# Patient Record
Sex: Female | Born: 1945 | Race: White | Hispanic: No | Smoking: Former smoker
Health system: Southern US, Community
[De-identification: ages and names within clinical notes are randomized; demographics above are authoritative.]

## PROBLEM LIST (undated history)

## (undated) DIAGNOSIS — G4700 Insomnia, unspecified: Secondary | ICD-10-CM

## (undated) DIAGNOSIS — I10 Essential (primary) hypertension: Secondary | ICD-10-CM

## (undated) DIAGNOSIS — R519 Headache, unspecified: Secondary | ICD-10-CM

## (undated) DIAGNOSIS — E785 Hyperlipidemia, unspecified: Secondary | ICD-10-CM

## (undated) DIAGNOSIS — Z8619 Personal history of other infectious and parasitic diseases: Secondary | ICD-10-CM

## (undated) DIAGNOSIS — M6250 Muscle wasting and atrophy, not elsewhere classified, unspecified site: Secondary | ICD-10-CM

## (undated) DIAGNOSIS — D519 Vitamin B12 deficiency anemia, unspecified: Secondary | ICD-10-CM

## (undated) DIAGNOSIS — F4321 Adjustment disorder with depressed mood: Secondary | ICD-10-CM

## (undated) DIAGNOSIS — R41841 Cognitive communication deficit: Secondary | ICD-10-CM

## (undated) DIAGNOSIS — J302 Other seasonal allergic rhinitis: Secondary | ICD-10-CM

## (undated) DIAGNOSIS — M810 Age-related osteoporosis without current pathological fracture: Secondary | ICD-10-CM

## (undated) DIAGNOSIS — J449 Chronic obstructive pulmonary disease, unspecified: Secondary | ICD-10-CM

## (undated) DIAGNOSIS — J45909 Unspecified asthma, uncomplicated: Secondary | ICD-10-CM

## (undated) DIAGNOSIS — R51 Headache: Secondary | ICD-10-CM

## (undated) HISTORY — DX: Headache: R51

## (undated) HISTORY — DX: Adjustment disorder with depressed mood: F43.21

## (undated) HISTORY — DX: Insomnia, unspecified: G47.00

## (undated) HISTORY — DX: Vitamin B12 deficiency anemia, unspecified: D51.9

## (undated) HISTORY — DX: Essential (primary) hypertension: I10

## (undated) HISTORY — DX: Hyperlipidemia, unspecified: E78.5

## (undated) HISTORY — DX: Personal history of other infectious and parasitic diseases: Z86.19

## (undated) HISTORY — DX: Other seasonal allergic rhinitis: J30.2

## (undated) HISTORY — PX: BREAST CYST ASPIRATION: SHX578

## (undated) HISTORY — PX: NASAL SINUS SURGERY: SHX719

## (undated) HISTORY — DX: Age-related osteoporosis without current pathological fracture: M81.0

## (undated) HISTORY — PX: ABDOMINAL HYSTERECTOMY: SHX81

## (undated) HISTORY — DX: Unspecified asthma, uncomplicated: J45.909

## (undated) HISTORY — DX: Headache, unspecified: R51.9

---

## 1968-08-01 HISTORY — PX: TONSILLECTOMY: SHX5217

## 1969-08-01 HISTORY — PX: BREAST EXCISIONAL BIOPSY: SUR124

## 1992-08-01 HISTORY — PX: TOTAL ABDOMINAL HYSTERECTOMY W/ BILATERAL SALPINGOOPHORECTOMY: SHX83

## 2004-06-16 ENCOUNTER — Ambulatory Visit: Payer: Self-pay

## 2006-09-21 ENCOUNTER — Ambulatory Visit: Payer: Self-pay | Admitting: Allergy and Immunology

## 2012-08-01 DIAGNOSIS — D519 Vitamin B12 deficiency anemia, unspecified: Secondary | ICD-10-CM

## 2012-08-01 HISTORY — DX: Vitamin B12 deficiency anemia, unspecified: D51.9

## 2013-05-01 LAB — COMPREHENSIVE METABOLIC PANEL
ALT: 13 U/L (ref 7–35)
AST: 8 U/L
Albumin: 4.4
Alkaline Phosphatase: 81 U/L
CREATININE: 0.8
Total Bilirubin: 0.5 mg/dL

## 2013-05-01 LAB — LIPID PANEL: Cholesterol: 170 mg/dL (ref 0–200)

## 2013-05-01 LAB — VITAMIN D 25 HYDROXY (VIT D DEFICIENCY, FRACTURES): VIT D 25 HYDROXY: 15

## 2013-05-09 LAB — IRON: Iron: 58

## 2013-05-09 LAB — CBC
HGB: 11.3 g/dL
WBC: 6
platelet count: 235

## 2013-05-09 LAB — FOLATE: FOLATE: 9.69

## 2013-05-09 LAB — FERRITIN: Ferritin: 93

## 2013-05-09 LAB — VITAMIN B12: B-12, serum: 214

## 2013-07-01 HISTORY — PX: COLONOSCOPY: SHX174

## 2013-07-15 ENCOUNTER — Ambulatory Visit: Payer: Self-pay | Admitting: Gastroenterology

## 2013-10-22 ENCOUNTER — Ambulatory Visit (INDEPENDENT_AMBULATORY_CARE_PROVIDER_SITE_OTHER): Payer: Commercial Managed Care - HMO | Admitting: Family Medicine

## 2013-10-22 ENCOUNTER — Encounter: Payer: Self-pay | Admitting: Family Medicine

## 2013-10-22 ENCOUNTER — Telehealth: Payer: Self-pay | Admitting: Family Medicine

## 2013-10-22 VITALS — BP 128/62 | HR 74 | Temp 98.1°F | Ht 58.5 in | Wt 123.0 lb

## 2013-10-22 DIAGNOSIS — F4321 Adjustment disorder with depressed mood: Secondary | ICD-10-CM

## 2013-10-22 DIAGNOSIS — J309 Allergic rhinitis, unspecified: Secondary | ICD-10-CM

## 2013-10-22 DIAGNOSIS — E785 Hyperlipidemia, unspecified: Secondary | ICD-10-CM

## 2013-10-22 DIAGNOSIS — G4700 Insomnia, unspecified: Secondary | ICD-10-CM

## 2013-10-22 DIAGNOSIS — I10 Essential (primary) hypertension: Secondary | ICD-10-CM

## 2013-10-22 DIAGNOSIS — J302 Other seasonal allergic rhinitis: Secondary | ICD-10-CM

## 2013-10-22 MED ORDER — DULOXETINE HCL 60 MG PO CPEP: 60.0000 mg | ORAL_CAPSULE | Freq: Every day | ORAL | Status: DC

## 2013-10-22 MED ORDER — CETIRIZINE HCL 10 MG PO TABS: 10.0000 mg | ORAL_TABLET | Freq: Every day | ORAL | Status: DC

## 2013-10-22 NOTE — Assessment & Plan Note (Signed)
Discussed decreased ambien to 5mg  daily.

## 2013-10-22 NOTE — Progress Notes (Signed)
BP 128/62  Pulse 74  Temp(Src) 98.1 F (36.7 C) (Oral)  Ht 4' 10.5" (1.486 m)  Wt 123 lb (55.792 kg)  BMI 25.27 kg/m2  SpO2 96%   CC: new pt to establish  Subjective:    Patient ID: Courtney Thomas, female    DOB: Aug 22, 1945, 68 y.o.   MRN: [id number]  HPI: Courtney Thomas is a 68 y.o. female presenting on 10/22/2013 for Establish Care   Previously saw Dr. Sabra Heck at [facility].  Also sees Dr. Erlinda Hong ortho.  I am familiar with patient as I care for her husband.  Depression with adjustment to husband's dementia/neurological disease.  Compliant with cymbalta 30mg  daily for last 1 year.  Prior on zoloft 25mg  but noticed increased tears.  Finds trouble handling stressors.  May look into respite care for personal time to avoid caregiver burnout.  HTN - ongoing issue for last 5 years.  Compliant with lisinopril hctz 20/12.5mg  daily HLD - compliant with lipitor 10mg  daily. Asthma - stable on PRN dulera.  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  Preventative: Last CPE was 1 year ago. Colon[place] - 07/[place]NL ([place]) Pap smear - 2014 WNL ([facility] in [place]) DEXA - several years ago abnormal, no f/u Does not receive flu shot - egg allergy Tdap - 2013 with bad reaction  Relevant past medical, surgical, family and social history reviewed and updated as indicated.  Allergies and medications reviewed and updated. No current outpatient prescriptions on file prior to visit.   No current facility-administered medications on file prior to visit.    Review of Systems Per HPI unless specifically indicated above    Objective:    BP 128/62  Pulse 74  Temp(Src) 98.1 F (36.7 C) (Oral)  Ht 4' 10.5" (1.486 m)  Wt 123 lb (55.792 kg)  BMI 25.27 kg/m2  SpO2 96%  Physical Exam  Nursing note and vitals reviewed. Constitutional: She is oriented to person, place, and time. She appears well-developed and well-nourished. No distress.  HENT:  Head: Normocephalic and  atraumatic.  Right Ear: Hearing, tympanic membrane, external ear and ear canal normal.  Left Ear: Hearing, tympanic membrane, external ear and ear canal normal.  Nose: Nose normal.  Mouth/Throat: Uvula is midline, oropharynx is clear and moist and mucous membranes are normal. No oropharyngeal exudate, posterior oropharyngeal edema or posterior oropharyngeal erythema.  Eyes: Conjunctivae and EOM are normal. Pupils are equal, round, and reactive to light. No scleral icterus.  Neck: Normal range of motion. Neck supple. No thyromegaly present.  Cardiovascular: Normal rate, regular rhythm, normal heart sounds and intact distal pulses.   No murmur heard. Pulses:      Radial pulses are 2+ on the right side, and 2+ on the left side.  Pulmonary/Chest: Effort normal and breath sounds normal. No respiratory distress. She has no wheezes. She has no rales.  Musculoskeletal: Normal range of motion. She exhibits no edema.  Lymphadenopathy:    She has no cervical adenopathy.  Neurological: She is alert and oriented to person, place, and time.  CN grossly intact, station and gait intact  Skin: Skin is warm and dry. No rash noted.  Psychiatric: She has a normal mood and affect. Her behavior is normal. Judgment and thought content normal.   No results found for this or any previous visit.    Assessment & Plan:   Problem List Items Addressed This Visit   Adjustment disorder with depressed mood     Could do  better with stress.  Will increase cymbalta to 60mg  dialy.  Pt agrees with plan.    Hyperlipidemia     Continue lipitor 10mg  dialy.  I have requested records from prior PCP Check FLP next fasting blood work.    Relevant Medications      lisinopril-hydrochlorothiazide (PRINZIDE,ZESTORETIC) 20-12.5 MG per tablet      atorvastatin (LIPITOR) 20 MG tablet   Hypertension - Primary     Continue lisinopril hctz.  I have requested records from prior PCP.    Insomnia     Discussed decreased ambien to 5mg   daily.    Seasonal allergic rhinitis     Continue cetirizine.  Refilled today        Follow up plan: Return in about 4 months (around 02/21/2014), or as needed, for annual exam, prior fasting for blood work.

## 2013-10-22 NOTE — Assessment & Plan Note (Signed)
Continue cetirizine.  Refilled today

## 2013-10-22 NOTE — Patient Instructions (Signed)
Sign release form for records from [facility] Dr. Sabra Heck. Let's increase cymbalta to 60mg  daily and let me know how mood is doing. Return at your conveinence fasting for blood work and afterwards for physical. Good to see you today!

## 2013-10-22 NOTE — Progress Notes (Signed)
Pre visit review using our clinic review tool, if applicable. No additional management support is needed unless otherwise documented below in the visit note. 

## 2013-10-22 NOTE — Assessment & Plan Note (Signed)
Continue lisinopril hctz.  I have requested records from prior PCP.

## 2013-10-22 NOTE — Telephone Encounter (Signed)
Relevant patient education mailed to patient.  

## 2013-10-22 NOTE — Assessment & Plan Note (Signed)
Could do better with stress.  Will increase cymbalta to 60mg  dialy.  Pt agrees with plan.

## 2013-10-22 NOTE — Assessment & Plan Note (Signed)
Continue lipitor 10mg  dialy.  I have requested records from prior PCP Check FLP next fasting blood work.

## 2013-11-30 ENCOUNTER — Encounter: Payer: Self-pay | Admitting: Family Medicine

## 2013-11-30 DIAGNOSIS — E538 Deficiency of other specified B group vitamins: Secondary | ICD-10-CM | POA: Insufficient documentation

## 2013-12-02 ENCOUNTER — Encounter: Payer: Self-pay | Admitting: *Deleted

## 2013-12-07 ENCOUNTER — Emergency Department: Payer: Self-pay | Admitting: Emergency Medicine

## 2013-12-19 ENCOUNTER — Encounter: Payer: Self-pay | Admitting: Family Medicine

## 2014-02-13 ENCOUNTER — Other Ambulatory Visit: Payer: Self-pay | Admitting: Family Medicine

## 2014-02-13 DIAGNOSIS — E559 Vitamin D deficiency, unspecified: Secondary | ICD-10-CM | POA: Insufficient documentation

## 2014-02-13 DIAGNOSIS — E785 Hyperlipidemia, unspecified: Secondary | ICD-10-CM

## 2014-02-13 DIAGNOSIS — D519 Vitamin B12 deficiency anemia, unspecified: Secondary | ICD-10-CM

## 2014-02-13 DIAGNOSIS — I10 Essential (primary) hypertension: Secondary | ICD-10-CM

## 2014-02-14 ENCOUNTER — Other Ambulatory Visit (INDEPENDENT_AMBULATORY_CARE_PROVIDER_SITE_OTHER): Payer: Commercial Managed Care - HMO

## 2014-02-14 DIAGNOSIS — E559 Vitamin D deficiency, unspecified: Secondary | ICD-10-CM

## 2014-02-14 DIAGNOSIS — I10 Essential (primary) hypertension: Secondary | ICD-10-CM

## 2014-02-14 DIAGNOSIS — D519 Vitamin B12 deficiency anemia, unspecified: Secondary | ICD-10-CM

## 2014-02-14 DIAGNOSIS — D518 Other vitamin B12 deficiency anemias: Secondary | ICD-10-CM

## 2014-02-14 DIAGNOSIS — E785 Hyperlipidemia, unspecified: Secondary | ICD-10-CM

## 2014-02-14 LAB — LIPID PANEL
CHOLESTEROL: 151 mg/dL (ref 0–200)
HDL: 69.8 mg/dL (ref >39.00)
LDL CALC: 71 mg/dL (ref 0–99)
NonHDL: 81.2
Total CHOL/HDL Ratio: 2
Triglycerides: 53 mg/dL (ref 0.0–149.0)
VLDL: 10.6 mg/dL (ref 0.0–40.0)

## 2014-02-14 LAB — BASIC METABOLIC PANEL
BUN: 14 mg/dL (ref 6–23)
CO2: 28 meq/L (ref 19–32)
CREATININE: 0.8 mg/dL (ref 0.4–1.2)
Calcium: 9.5 mg/dL (ref 8.4–10.5)
Chloride: 103 mEq/L (ref 96–112)
GFR: 76.16 mL/min (ref >60.00)
GLUCOSE: 90 mg/dL (ref 70–99)
Potassium: 3.3 mEq/L — ABNORMAL LOW (ref 3.5–5.1)
SODIUM: 139 meq/L (ref 135–145)

## 2014-02-14 LAB — CBC WITH DIFFERENTIAL/PLATELET
Basophils Absolute: 0 10*3/uL (ref 0.0–0.1)
Basophils Relative: 0.2 % (ref 0.0–3.0)
EOS ABS: 0.2 10*3/uL (ref 0.0–0.7)
Eosinophils Relative: 3.6 % (ref 0.0–5.0)
HCT: 37.4 % (ref 36.0–46.0)
Hemoglobin: 12.4 g/dL (ref 12.0–15.0)
LYMPHS PCT: 25.5 % (ref 12.0–46.0)
Lymphs Abs: 1.4 10*3/uL (ref 0.7–4.0)
MCHC: 33.2 g/dL (ref 30.0–36.0)
MCV: 89.8 fl (ref 78.0–100.0)
MONOS PCT: 10.6 % (ref 3.0–12.0)
Monocytes Absolute: 0.6 10*3/uL (ref 0.1–1.0)
NEUTROS ABS: 3.3 10*3/uL (ref 1.4–7.7)
NEUTROS PCT: 60.1 % (ref 43.0–77.0)
PLATELETS: 259 10*3/uL (ref 150.0–400.0)
RBC: 4.17 Mil/uL (ref 3.87–5.11)
RDW: 13.2 % (ref 11.5–15.5)
WBC: 5.5 10*3/uL (ref 4.0–10.5)

## 2014-02-14 LAB — VITAMIN B12: Vitamin B-12: 300 pg/mL (ref 211–911)

## 2014-02-14 LAB — VITAMIN D 25 HYDROXY (VIT D DEFICIENCY, FRACTURES): VITD: 16.62 ng/mL

## 2014-02-21 ENCOUNTER — Encounter: Payer: Self-pay | Admitting: Family Medicine

## 2014-02-21 ENCOUNTER — Ambulatory Visit (INDEPENDENT_AMBULATORY_CARE_PROVIDER_SITE_OTHER): Payer: Commercial Managed Care - HMO | Admitting: Family Medicine

## 2014-02-21 VITALS — BP 116/68 | HR 62 | Temp 98.1°F | Ht <= 58 in | Wt 118.0 lb

## 2014-02-21 DIAGNOSIS — M810 Age-related osteoporosis without current pathological fracture: Secondary | ICD-10-CM

## 2014-02-21 DIAGNOSIS — E785 Hyperlipidemia, unspecified: Secondary | ICD-10-CM

## 2014-02-21 DIAGNOSIS — Z0000 Encounter for general adult medical examination without abnormal findings: Secondary | ICD-10-CM

## 2014-02-21 DIAGNOSIS — D518 Other vitamin B12 deficiency anemias: Secondary | ICD-10-CM

## 2014-02-21 DIAGNOSIS — F4321 Adjustment disorder with depressed mood: Secondary | ICD-10-CM

## 2014-02-21 DIAGNOSIS — I10 Essential (primary) hypertension: Secondary | ICD-10-CM

## 2014-02-21 DIAGNOSIS — D519 Vitamin B12 deficiency anemia, unspecified: Secondary | ICD-10-CM

## 2014-02-21 DIAGNOSIS — E559 Vitamin D deficiency, unspecified: Secondary | ICD-10-CM

## 2014-02-21 NOTE — Assessment & Plan Note (Signed)
Chronic, well controlled on lipitor. Continue regimen.

## 2014-02-21 NOTE — Patient Instructions (Addendum)
Ensure good fruits/vegetables for potassium rich diet. Start vit D 2000 units daily Start vit B12 552mcg daily. Pass by Rosaria Ferries or Linda's office for bone density scheduling or we will call you for this. Good to see you today, call us with questions.

## 2014-02-21 NOTE — Assessment & Plan Note (Signed)
With nose and toe fractures over last year. Will update DEXA.

## 2014-02-21 NOTE — Assessment & Plan Note (Signed)
Chronic, stable. Continue lisinopril hctz.  

## 2014-02-21 NOTE — Progress Notes (Signed)
BP 116/68  Pulse 62  Temp(Src) 98.1 F (36.7 C) (Oral)  Ht 4\' 10"  (1.473 m)  Wt 118 lb (53.524 kg)  BMI 24.67 kg/m2  SpO2 97%   CC: medicare wellness visit  Subjective:    Patient ID: Courtney Thomas, female    DOB: 1945-10-27, 68 y.o.   MRN: [id number]  HPI: Courtney Thomas is a 68 y.o. female presenting on 02/21/2014 for Annual Exam   Caregiver stress - takes care of husband. Worsening sundowning with aggressive and combative behavior. Has been placed at [place]. Involved with PACE program as well.  Still working.  Vision screen - passed Hearing screen passed 1 fall in last year - broke nose. Tripped over pants. Seen at [facility] ER for this. Depression under treatment PHQ9 = 7  Preventative:  Last CPE was 1 year ago.  Colonoscopy - 07/2013 WNL 10 yr f/u ([place])  Well woman - with GYN, states pap smear - 2014 WNL ([facility] in [place])  Mammogram - [facility] 2014 WNL. She schedules her own mammograms. DEXA - h/o osteoporosis - took bisphosphonate in past. Does not receive flu shot - egg allergy  Tdap - 2013 with bad reaction zostavax - unsure about components Advanced directives: HCPOA is daughter. To bring me copy of living will.  Lives alone - husband currently [facility], involved in [place] as well. 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  Allergies and medications reviewed and updated. Current Outpatient Prescriptions on File Prior to Visit  Medication Sig  . albuterol (PROAIR HFA) 108 (90 BASE) MCG/ACT inhaler Inhale 2 puffs into the lungs every 6 (six) hours as needed for wheezing or shortness of breath.  Marland Kitchen atorvastatin (LIPITOR) 20 MG tablet Take 20 mg by mouth daily.  . cetirizine (ZYRTEC) 10 MG tablet Take 1 tablet (10 mg total) by mouth daily.  .  DULoxetine (CYMBALTA) 60 MG capsule Take 1 capsule (60 mg total) by mouth daily.  Marland Kitchen lisinopril-hydrochlorothiazide (PRINZIDE,ZESTORETIC) 20-12.5 MG per tablet Take 1 tablet by mouth daily.  . mometasone-formoterol (DULERA) 100-5 MCG/ACT AERO Inhale 2 puffs into the lungs as needed for wheezing.  Marland Kitchen zolpidem (AMBIEN) 10 MG tablet Take 10 mg by mouth at bedtime as needed for sleep.   No current facility-administered medications on file prior to visit.    Review of Systems Per HPI unless specifically indicated above    Objective:    BP 116/68  Pulse 62  Temp(Src) 98.1 F (36.7 C) (Oral)  Ht 4\' 10"  (1.473 m)  Wt 118 lb (53.524 kg)  BMI 24.67 kg/m2  SpO2 97%  Physical Exam  Nursing note and vitals reviewed. Constitutional: She is oriented to person, place, and time. She appears well-developed and well-nourished. No distress.  HENT:  Head: Normocephalic and atraumatic.  Right Ear: Hearing, tympanic membrane, external ear and ear canal normal.  Left Ear: Hearing, tympanic membrane, external ear and ear canal normal.  Nose: Nose normal.  Mouth/Throat: Uvula is midline, oropharynx is clear and moist and mucous membranes are normal. No oropharyngeal exudate, posterior oropharyngeal edema or posterior oropharyngeal erythema.  Eyes: Conjunctivae and EOM are normal. Pupils are equal, round, and reactive to light. No scleral icterus.  Neck: Normal range of motion. Neck supple. Carotid bruit is not present. No thyromegaly present.  Cardiovascular: Normal rate, regular rhythm, normal  heart sounds and intact distal pulses.   No murmur heard. Pulses:      Radial pulses are 2+ on the right side, and 2+ on the left side.  Pulmonary/Chest: Effort normal and breath sounds normal. No respiratory distress. She has no wheezes. She has no rales.  Breast exam deferred -per GYN  Abdominal: Soft. Bowel sounds are normal. She exhibits no distension and no mass. There is no tenderness. There is no rebound and  no guarding.  Genitourinary:  deferred  Musculoskeletal: Normal range of motion. She exhibits no edema.  Lymphadenopathy:    She has no cervical adenopathy.  Neurological: She is alert and oriented to person, place, and time.  CN grossly intact, station and gait intact Recall 3/3  Calculation 5/5 serial 3s  Skin: Skin is warm and dry. No rash noted.  Psychiatric: She has a normal mood and affect. Her behavior is normal. Judgment and thought content normal.   Results for orders placed in visit on 02/14/14  LIPID PANEL      Result Value Ref Range   Cholesterol 151  0 - 200 mg/dL   Triglycerides 53.0  0.0 - 149.0 mg/dL   HDL 69.80  >39.00 mg/dL   VLDL 10.6  0.0 - 40.0 mg/dL   LDL Cholesterol 71  0 - 99 mg/dL   Total CHOL/HDL Ratio 2     NonHDL 25.95    BASIC METABOLIC PANEL      Result Value Ref Range   Sodium 139  135 - 145 mEq/L   Potassium 3.3 (*) 3.5 - 5.1 mEq/L   Chloride 103  96 - 112 mEq/L   CO2 28  19 - 32 mEq/L   Glucose, Bld 90  70 - 99 mg/dL   BUN 14  6 - 23 mg/dL   Creatinine, Ser 0.8  0.4 - 1.2 mg/dL   Calcium 9.5  8.4 - 10.5 mg/dL   GFR 76.16  >60.00 mL/min  CBC WITH DIFFERENTIAL      Result Value Ref Range   WBC 5.5  4.0 - 10.5 K/uL   RBC 4.17  3.87 - 5.11 Mil/uL   Hemoglobin 12.4  12.0 - 15.0 g/dL   HCT 37.4  36.0 - 46.0 %   MCV 89.8  78.0 - 100.0 fl   MCHC 33.2  30.0 - 36.0 g/dL   RDW 13.2  11.5 - 15.5 %   Platelets 259.0  150.0 - 400.0 K/uL   Neutrophils Relative % 60.1  43.0 - 77.0 %   Lymphocytes Relative 25.5  12.0 - 46.0 %   Monocytes Relative 10.6  3.0 - 12.0 %   Eosinophils Relative 3.6  0.0 - 5.0 %   Basophils Relative 0.2  0.0 - 3.0 %   Neutro Abs 3.3  1.4 - 7.7 K/uL   Lymphs Abs 1.4  0.7 - 4.0 K/uL   Monocytes Absolute 0.6  0.1 - 1.0 K/uL   Eosinophils Absolute 0.2  0.0 - 0.7 K/uL   Basophils Absolute 0.0  0.0 - 0.1 K/uL  VITAMIN B12      Result Value Ref Range   Vitamin B-12 300  211 - 911 pg/mL  VITAMIN D 25 HYDROXY      Result Value  Ref Range   VITD 16.62        Assessment & Plan:   Problem List Items Addressed This Visit   Unspecified vitamin D deficiency     Increase vit D to 2000 IU daily.  Osteoporosis     With nose and toe fractures over last year. Will update DEXA.    Relevant Medications      VITAMIN D, CHOLECALCIFEROL, PO   Medicare annual wellness visit, initial - Primary     I have personally reviewed the Medicare Annual Wellness questionnaire and have noted 1. The patient's medical and social history 2. Their use of alcohol, tobacco or illicit drugs 3. Their current medications and supplements 4. The patient's functional ability including ADL's, fall risks, home safety risks and hearing or visual impairment. 5. Diet and physical activity 6. Evidence for depression or mood disorders The patients weight, height, BMI have been recorded in the chart.  Hearing and vision has been addressed. I have made referrals, counseling and provided education to the patient based review of the above and I have provided the pt with a written personalized care plan for preventive services. Provider list updated - see scanned questionairre. Advanced directives discussed: would want daughter to be HCPOA. I asked her to bring me copy of living will  Reviewed preventative protocols and updated unless pt declined.    Hypertension     Chronic, stable. Continue lisinopril hctz.    Hyperlipidemia     Chronic, well controlled on lipitor. Continue regimen.    B12 deficiency anemia     rec start B12 553mcg daily.    Relevant Medications      vitamin B-12 (CYANOCOBALAMIN) 500 MCG tablet   Adjustment disorder with depressed mood     Significant increase in stress - husband became combative. On cymbalta 60mg  daily. Feels overall managing stress appropriately.  Will notify me if feelings of overwhelm develop.     Other Visit Diagnoses   Osteoporosis, unspecified        Relevant Medications       VITAMIN D,  CHOLECALCIFEROL, PO    Other Relevant Orders       DG Bone Density        Follow up plan: Return in about 1 year (around 02/22/2015), or as needed, for medicare wellness.

## 2014-02-21 NOTE — Assessment & Plan Note (Signed)
rec start B12 577mcg daily.

## 2014-02-21 NOTE — Assessment & Plan Note (Signed)
Significant increase in stress - husband became combative. On cymbalta 60mg  daily. Feels overall managing stress appropriately.  Will notify me if feelings of overwhelm develop.

## 2014-02-21 NOTE — Progress Notes (Signed)
Pre visit review using our clinic review tool, if applicable. No additional management support is needed unless otherwise documented below in the visit note. 

## 2014-02-21 NOTE — Assessment & Plan Note (Signed)
I have personally reviewed the Medicare Annual Wellness questionnaire and have noted 1. The patient's medical and social history 2. Their use of alcohol, tobacco or illicit drugs 3. Their current medications and supplements 4. The patient's functional ability including ADL's, fall risks, home safety risks and hearing or visual impairment. 5. Diet and physical activity 6. Evidence for depression or mood disorders The patients weight, height, BMI have been recorded in the chart.  Hearing and vision has been addressed. I have made referrals, counseling and provided education to the patient based review of the above and I have provided the pt with a written personalized care plan for preventive services. Provider list updated - see scanned questionairre. Advanced directives discussed: would want daughter to be HCPOA. I asked her to bring me copy of living will  Reviewed preventative protocols and updated unless pt declined.

## 2014-02-21 NOTE — Assessment & Plan Note (Signed)
Increase vit D to 2000 IU daily.  

## 2014-02-27 ENCOUNTER — Telehealth: Payer: Self-pay | Admitting: *Deleted

## 2014-02-27 MED ORDER — BUPROPION HCL ER (SR) 150 MG PO TB12: 150.0000 mg | ORAL_TABLET | Freq: Every day | ORAL | Status: DC

## 2014-02-27 NOTE — Telephone Encounter (Signed)
I'm sorry husband isn't doing well On cymbalta 60mg  daily. Let's add on wellbutrin at 150mg  once daily. Return to see me in 3-4 wks for f/u, notify us sooner if worsening.

## 2014-02-27 NOTE — Telephone Encounter (Signed)
Message left notifying patient. Advised to call and schedule follow up.

## 2014-02-27 NOTE — Telephone Encounter (Signed)
Pt left voicemail at Triage: Pt said that Joe's neurologist moved him to a facility in [place], and he only has a few months to live. Pt said she isn't taking this news well and she feels like she just has a "big Lein in her chest" all the time and she is really upset. Pt is requesting medication to help her with this stress. Pt uses [facility], pt request call back

## 2014-03-01 HISTORY — PX: OTHER SURGICAL HISTORY: SHX169

## 2014-03-04 ENCOUNTER — Telehealth: Payer: Self-pay

## 2014-03-04 MED ORDER — CIPROFLOXACIN HCL 250 MG PO TABS: 250.0000 mg | ORAL_TABLET | Freq: Two times a day (BID) | ORAL | Status: DC

## 2014-03-04 NOTE — Telephone Encounter (Signed)
Left detailed message on voicemail.  

## 2014-03-04 NOTE — Telephone Encounter (Signed)
Start cipro. F/u here prn, if not better.  Drink plenty of fluids.  Thanks.  rx sent.

## 2014-03-04 NOTE — Telephone Encounter (Signed)
Pt said she has recently developed pressure feeling when urinates, frequency of urine and urine appears darker color than usual. Lower back pain on lt side. No abd pain or fever. Pt has hx of kidney stones as well. Pt is drinking more water now and cannot schedule appt due to preparing for pts husbands death. Pt request med sent to [facility]. Pt request cb.

## 2014-03-25 ENCOUNTER — Ambulatory Visit (INDEPENDENT_AMBULATORY_CARE_PROVIDER_SITE_OTHER): Payer: Commercial Managed Care - HMO | Admitting: Family Medicine

## 2014-03-25 ENCOUNTER — Encounter: Payer: Self-pay | Admitting: Family Medicine

## 2014-03-25 VITALS — BP 118/72 | HR 76 | Temp 98.1°F | Wt 118.0 lb

## 2014-03-25 DIAGNOSIS — F4321 Adjustment disorder with depressed mood: Secondary | ICD-10-CM

## 2014-03-25 DIAGNOSIS — M810 Age-related osteoporosis without current pathological fracture: Secondary | ICD-10-CM

## 2014-03-25 DIAGNOSIS — D518 Other vitamin B12 deficiency anemias: Secondary | ICD-10-CM

## 2014-03-25 DIAGNOSIS — D519 Vitamin B12 deficiency anemia, unspecified: Secondary | ICD-10-CM

## 2014-03-25 DIAGNOSIS — E559 Vitamin D deficiency, unspecified: Secondary | ICD-10-CM

## 2014-03-25 MED ORDER — BUPROPION HCL ER (SR) 150 MG PO TB12: 150.0000 mg | ORAL_TABLET | Freq: Every day | ORAL | Status: DC

## 2014-03-25 MED ORDER — DULOXETINE HCL 60 MG PO CPEP: 60.0000 mg | ORAL_CAPSULE | Freq: Every day | ORAL | Status: DC

## 2014-03-25 NOTE — Assessment & Plan Note (Signed)
Reports compliance with 2000 IU daily.

## 2014-03-25 NOTE — Progress Notes (Signed)
Pre visit review using our clinic review tool, if applicable. No additional management support is needed unless otherwise documented below in the visit note. 

## 2014-03-25 NOTE — Assessment & Plan Note (Signed)
Upcoming appt for DEXA this week. Prior on fosamax for years.

## 2014-03-25 NOTE — Assessment & Plan Note (Signed)
Compliant with 570mcg daily.

## 2014-03-25 NOTE — Progress Notes (Signed)
   BP 118/72  Pulse 76  Temp(Src) 98.1 F (36.7 C) (Oral)  Wt 118 lb (53.524 kg)   CC: 1 mo f/u  Subjective:    Patient ID: Courtney Thomas, female    DOB: [birth year], 68 y.o.   MRN: [id number]  HPI: JAMAE TISON is a 68 y.o. female presenting on 03/25/2014 for Follow-up   Husband not doing well.  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 Currently in [place]. GI system has shut down. Hospice involved. Hospice RN goes once a week to evaluate him.  Daughters having trouble dealing w/ illness.  On cymbalta 60mg  daily and wellbutrin was added 1 mo ago 150mg  once daily. Uses ambien to sleep but still awakens at night time.  Overall feels doing well on this regimen.  Recent UTI sxs was unable to come in to office for evaluation. Treated with cipro 250mg  bid x 3 days. Drinking more water. UTI sxs have resolved.  Relevant past medical, surgical, family and social history reviewed and updated as indicated.  Allergies and medications reviewed and updated. Current Outpatient Prescriptions on File Prior to Visit  Medication Sig  . albuterol (PROAIR HFA) 108 (90 BASE) MCG/ACT inhaler Inhale 2 puffs into the lungs every 6 (six) hours as needed for wheezing or shortness of breath.  Marland Kitchen atorvastatin (LIPITOR) 20 MG tablet Take 20 mg by mouth daily.  . cetirizine (ZYRTEC) 10 MG tablet Take 1 tablet (10 mg total) by mouth daily.  Marland Kitchen lisinopril-hydrochlorothiazide (PRINZIDE,ZESTORETIC) 20-12.5 MG per tablet Take 1 tablet by mouth daily.  . mometasone-formoterol (DULERA) 100-5 MCG/ACT AERO Inhale 2 puffs into the lungs as needed for wheezing.  . vitamin B-12 (CYANOCOBALAMIN) 500 MCG tablet Take 500 mcg by mouth daily.  Marland Kitchen VITAMIN D, CHOLECALCIFEROL, PO Take 1 tablet by mouth daily. 2000 IU  . zolpidem (AMBIEN) 10 MG tablet Take 10 mg by mouth at bedtime as needed for sleep.   No current facility-administered medications on file prior to visit.    Review of Systems Per HPI unless specifically  indicated above    Objective:    BP 118/72  Pulse 76  Temp(Src) 98.1 F (36.7 C) (Oral)  Wt 118 lb (53.524 kg)  Physical Exam  Nursing note and vitals reviewed. Constitutional: She appears well-developed and well-nourished. No distress.  Psychiatric: She has a normal mood and affect. Her behavior is normal. Thought content normal.       Assessment & Plan:   Problem List Items Addressed This Visit   Adjustment disorder with depressed mood     Continue cymbalta 60mg  daily, continue wellbutrin 150mg  SL once daily. Pt feels better managing current stressors with this regimen. Will continue.    B12 deficiency anemia     Compliant with 578mcg daily.    Osteoporosis     Upcoming appt for DEXA this week. Prior on fosamax for years.    Unspecified vitamin D deficiency - Primary     Reports compliance with 2000 IU daily.        Follow up plan: No Follow-up on file.

## 2014-03-25 NOTE — Assessment & Plan Note (Signed)
Continue cymbalta 60mg  daily, continue wellbutrin 150mg  SL once daily. Pt feels better managing current stressors with this regimen. Will continue.

## 2014-03-27 ENCOUNTER — Encounter: Payer: Self-pay | Admitting: Family Medicine

## 2014-03-27 ENCOUNTER — Ambulatory Visit: Payer: Self-pay | Admitting: Family Medicine

## 2014-04-05 ENCOUNTER — Encounter: Payer: Self-pay | Admitting: Family Medicine

## 2014-04-08 ENCOUNTER — Telehealth: Payer: Self-pay | Admitting: *Deleted

## 2014-04-08 NOTE — Telephone Encounter (Signed)
Ok to refill 

## 2014-04-09 ENCOUNTER — Other Ambulatory Visit: Payer: Self-pay | Admitting: Family Medicine

## 2014-04-09 MED ORDER — ALENDRONATE SODIUM 70 MG PO TABS: 70.0000 mg | ORAL_TABLET | ORAL | Status: DC

## 2014-04-09 MED ORDER — ZOLPIDEM TARTRATE 10 MG PO TABS: 10.0000 mg | ORAL_TABLET | Freq: Every evening | ORAL | Status: DC | PRN

## 2014-04-09 NOTE — Addendum Note (Signed)
Addended by: Ria Bush on: 04/09/2014 08:52 PM   Modules accepted: Orders

## 2014-04-09 NOTE — Telephone Encounter (Signed)
Pt left v/m wanting to know if 5 refills for ambien could be sent to Courtney Thomas; pt does not want to call monthly for refill.Please advise.

## 2014-04-09 NOTE — Telephone Encounter (Signed)
May phone in with refills.

## 2014-04-09 NOTE — Telephone Encounter (Signed)
Rx called in as directed.   

## 2014-04-09 NOTE — Telephone Encounter (Signed)
plz phone in. 

## 2014-04-10 NOTE — Telephone Encounter (Signed)
Rx called in as directed and message left notifying patient. 

## 2014-06-09 ENCOUNTER — Telehealth: Payer: Self-pay

## 2014-06-09 NOTE — Telephone Encounter (Signed)
spoke with wife and expressed my condolences.

## 2014-06-09 NOTE — Telephone Encounter (Signed)
Pt request refill duloxetine to [facility]; advised pt refill sent 03/25/14 and pt will ck with pharmacy; pt wanted Dr Danise Mina and Maudie Mercury to know that pts husband, Dot Splinter did pass away.

## 2014-07-17 ENCOUNTER — Other Ambulatory Visit: Payer: Self-pay

## 2014-07-17 MED ORDER — LISINOPRIL-HYDROCHLOROTHIAZIDE 20-12.5 MG PO TABS
1.0000 | ORAL_TABLET | Freq: Every day | ORAL | Status: DC
Start: 2014-07-17 — End: 2014-09-08

## 2014-07-17 NOTE — Telephone Encounter (Signed)
Pt request 90 day refill lisinopril HCTZ to Courtney Thomas; Courtney Thomas had been requesting refills from Dr Sabra Heck at [place] [facility] which pt no longer sees anymore; advised will send lisinopril HCTZ 20-12.5 mg taking one daily to Courtney Thomas.

## 2014-08-30 ENCOUNTER — Emergency Department: Payer: Self-pay | Admitting: Emergency Medicine

## 2014-08-30 DIAGNOSIS — I10 Essential (primary) hypertension: Secondary | ICD-10-CM | POA: Diagnosis not present

## 2014-08-30 DIAGNOSIS — Z79899 Other long term (current) drug therapy: Secondary | ICD-10-CM | POA: Diagnosis not present

## 2014-08-30 DIAGNOSIS — Z87891 Personal history of nicotine dependence: Secondary | ICD-10-CM | POA: Diagnosis not present

## 2014-08-30 DIAGNOSIS — M160 Bilateral primary osteoarthritis of hip: Secondary | ICD-10-CM | POA: Diagnosis not present

## 2014-08-30 DIAGNOSIS — M25552 Pain in left hip: Secondary | ICD-10-CM | POA: Diagnosis not present

## 2014-09-08 ENCOUNTER — Encounter: Payer: Self-pay | Admitting: Family Medicine

## 2014-09-08 ENCOUNTER — Ambulatory Visit (INDEPENDENT_AMBULATORY_CARE_PROVIDER_SITE_OTHER): Payer: Commercial Managed Care - HMO | Admitting: Family Medicine

## 2014-09-08 VITALS — BP 116/72 | HR 64 | Temp 98.2°F | Wt 118.5 lb

## 2014-09-08 DIAGNOSIS — M810 Age-related osteoporosis without current pathological fracture: Secondary | ICD-10-CM | POA: Diagnosis not present

## 2014-09-08 DIAGNOSIS — M47816 Spondylosis without myelopathy or radiculopathy, lumbar region: Secondary | ICD-10-CM | POA: Diagnosis not present

## 2014-09-08 MED ORDER — OMEPRAZOLE 20 MG PO CPDR: 20.0000 mg | DELAYED_RELEASE_CAPSULE | Freq: Every day | ORAL | Status: DC

## 2014-09-08 MED ORDER — LISINOPRIL-HYDROCHLOROTHIAZIDE 20-12.5 MG PO TABS: 1.0000 | ORAL_TABLET | Freq: Every day | ORAL | Status: DC

## 2014-09-08 MED ORDER — CETIRIZINE HCL 10 MG PO TABS: 10.0000 mg | ORAL_TABLET | Freq: Every day | ORAL | Status: DC

## 2014-09-08 NOTE — Patient Instructions (Addendum)
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 Try tylenol 500mg  twice daily. If this doesn't help, may do aleve 220mg  once daily - if you need regularly, take with omeprazole 20mg  daily (prescription printed out today). Continue vitamin D. Continue walking regularly. Look into osteo biflex or plain glucosamine which can be helpful for joint health.  Osteoarthritis Osteoarthritis is a disease that causes soreness and inflammation of a joint. It occurs when the cartilage at the affected joint wears down. Cartilage acts as a cushion, covering the ends of bones where they meet to form a joint. Osteoarthritis is the most common form of arthritis. It often occurs in older people. The joints affected most often by this condition include those in the:  Ends of the fingers.  Thumbs.  Neck.  Lower back.  Knees.  Hips. CAUSES  Over time, the cartilage that covers the ends of bones begins to wear away. This causes bone to rub on bone, producing pain and stiffness in the affected joints.  RISK FACTORS Certain factors can increase your chances of having osteoarthritis, including:  Older age.  Excessive body weight.  Overuse of joints.  Previous joint injury. SIGNS AND SYMPTOMS   Pain, swelling, and stiffness in the joint.  Over time, the joint may lose its normal shape.  Small deposits of bone (osteophytes) may grow on the edges of the joint.  Bits of bone or cartilage can break off and float inside the joint space. This may cause more pain and damage. DIAGNOSIS  Your health care provider will do a physical exam and ask about your symptoms. Various tests may be ordered, such as:  X-rays of the affected joint.  An MRI scan.  Blood tests to rule out other types of arthritis.  Joint fluid tests. This involves using a needle to draw fluid from the joint and examining the fluid under a microscope. TREATMENT  Goals of treatment are to control pain and improve joint function.  Treatment plans may include:  A prescribed exercise program that allows for rest and joint relief.  A weight control plan.  Pain relief techniques, such as:  Properly applied heat and cold.  Electric pulses delivered to nerve endings under the skin (transcutaneous electrical nerve stimulation [TENS]).  Massage.  Certain nutritional supplements.  Medicines to control pain, such as:  Acetaminophen.  Nonsteroidal anti-inflammatory drugs (NSAIDs), such as naproxen.  Narcotic or central-acting agents, such as tramadol.  Corticosteroids. These can be given orally or as an injection.  Surgery to reposition the bones and relieve pain (osteotomy) or to remove loose pieces of bone and cartilage. Joint replacement may be needed in advanced states of osteoarthritis. HOME CARE INSTRUCTIONS   Take medicines only as directed by your health care provider.  Maintain a healthy weight. Follow your health care provider's instructions for weight control. This may include dietary instructions.  Exercise as directed. Your health care provider can recommend specific types of exercise. These may include:  Strengthening exercises. These are done to strengthen the muscles that support joints affected by arthritis. They can be performed with weights or with exercise bands to add resistance.  Aerobic activities. These are exercises, such as brisk walking or low-impact aerobics, that get your heart pumping.  Range-of-motion activities. These keep your joints limber.  Balance and agility exercises. These help you maintain daily living skills.  Rest your affected joints as directed by your health care provider.  Keep all follow-up visits as directed by your health care provider.  SEEK MEDICAL CARE IF:   Your skin turns red.  You develop a rash in addition to your joint pain.  You have worsening joint pain.  You have a fever along with joint or muscle aches. SEEK IMMEDIATE MEDICAL CARE  IF:  You have a significant loss of weight or appetite.  You have night sweats. [place] of Arthritis and Musculoskeletal and Skin Diseases: www.niams.SouthExposed.es  [employer] on Aging: http://kim-miller.com/  American College of Rheumatology: www.rheumatology.org Document Released: 07/18/2005 Document Revised: 12/02/2013 Document Reviewed: 03/25/2013 [facility] Patient Information 2015 [place], [place]. This information is not intended to replace advice given to you by your health care provider. Make sure you discuss any questions you have with your health care provider.

## 2014-09-08 NOTE — Progress Notes (Signed)
BP 116/72 mmHg  Pulse 64  Temp(Src) 98.2 F (36.8 C) (Oral)  Wt 118 lb 8 oz (53.751 kg)   CC: L hip pain f/u [facility] visit  Subjective:    Patient ID: Courtney Thomas, female    DOB: 11/29/1945, 69 y.o.   MRN: [id number]  HPI: Courtney Thomas is a 69 y.o. female presenting on 09/08/2014 for Follow-up   Recent [facility] visit 08/30/2014 for lower back pain without radiation. Treated with naprosyn once daily with food which has helped pains.   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 Wants to feel well for this.  Denies inciting trauma/falls. No paresthesias or numbness or weakness of legs. No fevers, bowel/bladder accidents. Does some heavy lifting at work.   Known IP joint arthritis in past.   Xray reviewed. 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  Hasn't tried plain tylenol.  BP Readings from Last 3 Encounters:  09/08/14 116/72  03/25/14 118/72  02/21/14 116/68    Relevant past medical, surgical, family and social history reviewed and updated as indicated. Interim medical history since our last visit reviewed. Allergies and medications reviewed and updated. Current Outpatient Prescriptions on File Prior to Visit  Medication Sig  . albuterol (PROAIR HFA) 108 (90 BASE) MCG/ACT inhaler Inhale 2 puffs into the lungs every 6 (six) hours as needed for wheezing or shortness of breath.  Marland Kitchen alendronate (FOSAMAX) 70 MG tablet Take 1 tablet (70 mg total) by mouth every 7 (seven) days. Take with a full glass of water on an empty stomach.  Marland Kitchen atorvastatin (LIPITOR) 20 MG tablet Take 20 mg by mouth daily.  Marland Kitchen buPROPion (WELLBUTRIN SR) 150 MG 12 hr tablet Take 1 tablet (150 mg total) by mouth daily.  . DULoxetine (CYMBALTA) 60 MG capsule Take 1 capsule (60 mg total) by mouth daily.  . mometasone-formoterol (DULERA) 100-5 MCG/ACT AERO Inhale 2 puffs into the lungs as needed for wheezing.  . vitamin B-12 (CYANOCOBALAMIN) 500 MCG tablet Take 500 mcg by mouth daily.  Marland Kitchen VITAMIN D,  CHOLECALCIFEROL, PO Take 1 tablet by mouth daily. 2000 IU  . zolpidem (AMBIEN) 10 MG tablet Take 1 tablet (10 mg total) by mouth at bedtime as needed for sleep.   No current facility-administered medications on file prior to visit.    Review of Systems Per HPI unless specifically indicated above     Objective:    BP 116/72 mmHg  Pulse 64  Temp(Src) 98.2 F (36.8 C) (Oral)  Wt 118 lb 8 oz (53.751 kg)  Wt Readings from Last 3 Encounters:  09/08/14 118 lb 8 oz (53.751 kg)  03/25/14 118 lb (53.524 kg)  02/21/14 118 lb (53.524 kg)    Physical Exam  Constitutional: She is oriented to person, place, and time. She appears well-developed and well-nourished. No distress.  HENT:  Mouth/Throat: Oropharynx is clear and moist. No oropharyngeal exudate.  Musculoskeletal: She exhibits no edema.  Mild discomfort midline lower lumbar spine Mild paraspinous mm tenderness Neg SLR bilaterally. No pain with int/ext rotation at hip. Neg FABER. No pain at SIJ, GTB bilaterally. Tender at R sciatic notch  Neurological: She is alert and oriented to person, place, and time.  Skin: Skin is warm and dry. No rash noted.  Nursing note and vitals reviewed.     Assessment & Plan:   Problem List Items Addressed This Visit    Osteoporosis    Continue fosamax daily. Pain not consistent with atypical hip fracture pain, xrays negative for  acute fracture.      Osteoarthritis of lumbar spine - Primary    Discussed dx as well as treatment/management plan. [facility]ray done at [facility] showing mild bilateral hip osteoarthritis, but exam today more consistent with DDD etiology. Discussed try tylenol 500mg  bid to tid first, and if uncontrolled then may change to aleve 220mg  daily - if needs regularly and heartburn sxs develop, provided with script for omeprazole 20mg  to fill. Discussed vit D use, osteo bi flex (glucosamine) supplement use, and encouraged continued walking regularly.      Relevant Medications   naproxen  sodium (ANAPROX) 220 MG tablet       Follow up plan: Return as needed.

## 2014-09-08 NOTE — Assessment & Plan Note (Signed)
Continue fosamax daily. Pain not consistent with atypical hip fracture pain, xrays negative for acute fracture.

## 2014-09-08 NOTE — Assessment & Plan Note (Signed)
Discussed dx as well as treatment/management plan. Xray done at [facility] showing mild bilateral hip osteoarthritis, but exam today more consistent with DDD etiology. Discussed try tylenol 500mg  bid to tid first, and if uncontrolled then may change to aleve 220mg  daily - if needs regularly and heartburn sxs develop, provided with script for omeprazole 20mg  to fill. Discussed vit D use, osteo bi flex (glucosamine) supplement use, and encouraged continued walking regularly.

## 2014-09-08 NOTE — Progress Notes (Signed)
Pre visit review using our clinic review tool, if applicable. No additional management support is needed unless otherwise documented below in the visit note. 

## 2014-10-09 ENCOUNTER — Other Ambulatory Visit: Payer: Self-pay | Admitting: Family Medicine

## 2014-10-09 NOTE — Telephone Encounter (Signed)
plz phone in. 

## 2014-10-09 NOTE — Telephone Encounter (Signed)
Ok to refill 

## 2014-10-10 NOTE — Telephone Encounter (Signed)
Rx called in as directed.   

## 2015-01-06 ENCOUNTER — Other Ambulatory Visit: Payer: Self-pay | Admitting: Family Medicine

## 2015-01-06 NOTE — Telephone Encounter (Signed)
Rx called in as directed.   

## 2015-01-06 NOTE — Telephone Encounter (Signed)
Ok to refill 

## 2015-01-06 NOTE — Telephone Encounter (Signed)
plz phone in. 

## 2015-01-27 ENCOUNTER — Encounter: Payer: Self-pay | Admitting: Family Medicine

## 2015-01-27 ENCOUNTER — Ambulatory Visit (INDEPENDENT_AMBULATORY_CARE_PROVIDER_SITE_OTHER): Payer: Commercial Managed Care - HMO | Admitting: Family Medicine

## 2015-01-27 VITALS — BP 120/66 | HR 80 | Temp 98.1°F | Wt 121.5 lb

## 2015-01-27 DIAGNOSIS — M47816 Spondylosis without myelopathy or radiculopathy, lumbar region: Secondary | ICD-10-CM | POA: Diagnosis not present

## 2015-01-27 DIAGNOSIS — F4321 Adjustment disorder with depressed mood: Secondary | ICD-10-CM | POA: Diagnosis not present

## 2015-01-27 DIAGNOSIS — M810 Age-related osteoporosis without current pathological fracture: Secondary | ICD-10-CM

## 2015-01-27 DIAGNOSIS — E559 Vitamin D deficiency, unspecified: Secondary | ICD-10-CM

## 2015-01-27 MED ORDER — DULOXETINE HCL 30 MG PO CPEP: 30.0000 mg | ORAL_CAPSULE | Freq: Every day | ORAL | Status: DC

## 2015-01-27 NOTE — Progress Notes (Signed)
Pre visit review using our clinic review tool, if applicable. No additional management support is needed unless otherwise documented below in the visit note. 

## 2015-01-27 NOTE — Assessment & Plan Note (Signed)
Continue fosamax and vit D.

## 2015-01-27 NOTE — Patient Instructions (Addendum)
I agree with med change. Let's come off of cymbalta. Start 30mg  dose for next 3 weeks then come off this medicine. Let me know how you're doing. Continue wellbutrin 150mg  once daily.  Continue walking regularly.  Return in 1-2 months for follow up visit and medicare wellness visit.

## 2015-01-27 NOTE — Progress Notes (Signed)
BP 120/66 mmHg  Pulse 80  Temp(Src) 98.1 F (36.7 C) (Oral)  Wt 121 lb 8 oz (55.112 kg)   CC: discuss meds  Subjective:    Patient ID: Courtney Thomas, female    DOB: 1945/11/24, 69 y.o.   MRN: [id number]  HPI: Courtney Thomas is a 69 y.o. female presenting on 01/27/2015 for Medication Management   "My emotions are all over the place". New job with improved dytrdd - now works at [employer]. Doesn't feel depressed anymore. Feels irritable and mean. Difficulty focusing. A bit restless. No anhedonia. Started painting again. No trouble sleeping, ambien helps. No appetite changes. Energy level ok. Has been on cymbalta and wellbutrin for last few years.   Has started walking with friend Marland Kitchen Wk ago).   On fosamax weekly + vitamin D for osteoporosis as well as glucosamine.  Relevant past medical, surgical, family and social history reviewed and updated as indicated. Interim medical history since our last visit reviewed. Allergies and medications reviewed and updated. Current Outpatient Prescriptions on File Prior to Visit  Medication Sig  . alendronate (FOSAMAX) 70 MG tablet Take 1 tablet (70 mg total) by mouth every 7 (seven) days. Take with a full glass of water on an empty stomach.  Marland Kitchen atorvastatin (LIPITOR) 20 MG tablet Take 20 mg by mouth daily.  Marland Kitchen buPROPion (WELLBUTRIN SR) 150 MG 12 hr tablet Take 1 tablet (150 mg total) by mouth daily.  . cetirizine (ZYRTEC) 10 MG tablet Take 1 tablet (10 mg total) by mouth daily.  Marland Kitchen lisinopril-hydrochlorothiazide (PRINZIDE,ZESTORETIC) 20-12.5 MG per tablet Take 1 tablet by mouth daily.  . vitamin B-12 (CYANOCOBALAMIN) 500 MCG tablet Take 500 mcg by mouth daily.  Marland Kitchen zolpidem (AMBIEN) 10 MG tablet TAKE ONE TABLET BY MOUTH AT BEDTIME AS NEEDED  . albuterol (PROAIR HFA) 108 (90 BASE) MCG/ACT inhaler Inhale 2 puffs into the lungs every 6 (six) hours as needed for wheezing or shortness of breath.  . mometasone-formoterol (DULERA) 100-5 MCG/ACT AERO Inhale 2 puffs into  the lungs as needed for wheezing.   No current facility-administered medications on file prior to visit.    Review of Systems Per HPI unless specifically indicated above     Objective:    BP 120/66 mmHg  Pulse 80  Temp(Src) 98.1 F (36.7 C) (Oral)  Wt 121 lb 8 oz (55.112 kg)  Wt Readings from Last 3 Encounters:  01/27/15 121 lb 8 oz (55.112 kg)  09/08/14 118 lb 8 oz (53.751 kg)  03/25/14 118 lb (53.524 kg)    Physical Exam  Constitutional: She appears well-developed and well-nourished. No distress.  Psychiatric: She has a normal mood and affect. Her behavior is normal. Judgment and thought content normal.  Nursing note and vitals reviewed.     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 I agree depression overall improved. Will taper off cymbalta and reassess mood. Continue wellbutrin for now. Pt agrees with plan.      Osteoarthritis of lumbar spine    Pain has improved. ?vit D related. Continue.      Osteoporosis    Continue fosamax and vit D.      Relevant Medications   Cholecalciferol (VITAMIN D) 2000 UNITS CAPS   Vitamin D deficiency    Continue 2000 IU daily.          Follow up plan: No Follow-up  on file.

## 2015-01-27 NOTE — Assessment & Plan Note (Signed)
Continue 2000 IU daily.  

## 2015-01-27 NOTE — Assessment & Plan Note (Signed)
Pain has improved. ?vit D related. Continue.

## 2015-01-27 NOTE — Assessment & Plan Note (Signed)
Discussed importance of regular exercise. I agree depression overall improved. Will taper off cymbalta and reassess mood. Continue wellbutrin for now. Pt agrees with plan.

## 2015-02-05 ENCOUNTER — Other Ambulatory Visit: Payer: Self-pay | Admitting: Family Medicine

## 2015-02-05 DIAGNOSIS — Z1231 Encounter for screening mammogram for malignant neoplasm of breast: Secondary | ICD-10-CM

## 2015-02-05 NOTE — Telephone Encounter (Signed)
plz phone in. 

## 2015-02-05 NOTE — Telephone Encounter (Signed)
Ok to refill 

## 2015-02-05 NOTE — Telephone Encounter (Signed)
Rx called in as directed.   

## 2015-02-06 ENCOUNTER — Other Ambulatory Visit: Payer: Self-pay | Admitting: Family Medicine

## 2015-02-09 ENCOUNTER — Other Ambulatory Visit: Payer: Self-pay | Admitting: Family Medicine

## 2015-02-09 ENCOUNTER — Ambulatory Visit
Admission: RE | Admit: 2015-02-09 | Discharge: 2015-02-09 | Disposition: A | Discharge status: Home / Self Care | Payer: Commercial Managed Care - HMO | Source: Ambulatory Visit | Attending: Family Medicine | Admitting: Family Medicine

## 2015-02-09 DIAGNOSIS — Z1231 Encounter for screening mammogram for malignant neoplasm of breast: Secondary | ICD-10-CM

## 2015-02-10 ENCOUNTER — Encounter: Payer: Self-pay | Admitting: *Deleted

## 2015-02-10 LAB — HM MAMMOGRAPHY: HM Mammogram: NORMAL

## 2015-03-05 ENCOUNTER — Other Ambulatory Visit: Payer: Self-pay | Admitting: Family Medicine

## 2015-03-05 NOTE — Telephone Encounter (Signed)
Ok to refill 

## 2015-03-06 NOTE — Telephone Encounter (Signed)
Rx called in as directed.   

## 2015-03-06 NOTE — Telephone Encounter (Signed)
plz phone in. 

## 2015-03-09 ENCOUNTER — Other Ambulatory Visit: Payer: Self-pay | Admitting: Family Medicine

## 2015-03-10 ENCOUNTER — Other Ambulatory Visit (INDEPENDENT_AMBULATORY_CARE_PROVIDER_SITE_OTHER): Payer: Commercial Managed Care - HMO

## 2015-03-10 ENCOUNTER — Other Ambulatory Visit: Payer: Self-pay | Admitting: Family Medicine

## 2015-03-10 DIAGNOSIS — E559 Vitamin D deficiency, unspecified: Secondary | ICD-10-CM

## 2015-03-10 DIAGNOSIS — D519 Vitamin B12 deficiency anemia, unspecified: Secondary | ICD-10-CM

## 2015-03-10 DIAGNOSIS — E785 Hyperlipidemia, unspecified: Secondary | ICD-10-CM

## 2015-03-10 DIAGNOSIS — I10 Essential (primary) hypertension: Secondary | ICD-10-CM | POA: Diagnosis not present

## 2015-03-10 LAB — COMPREHENSIVE METABOLIC PANEL
ALT: 13 U/L (ref 0–35)
AST: 8 U/L (ref 0–37)
Albumin: 4 g/dL (ref 3.5–5.2)
Alkaline Phosphatase: 98 U/L (ref 39–117)
BILIRUBIN TOTAL: 0.3 mg/dL (ref 0.2–1.2)
BUN: 32 mg/dL — AB (ref 6–23)
CO2: 29 mEq/L (ref 19–32)
CREATININE: 0.83 mg/dL (ref 0.40–1.20)
Calcium: 9.3 mg/dL (ref 8.4–10.5)
Chloride: 102 mEq/L (ref 96–112)
GFR: 72.76 mL/min (ref >60.00)
Glucose, Bld: 97 mg/dL (ref 70–99)
Potassium: 4.2 mEq/L (ref 3.5–5.1)
SODIUM: 137 meq/L (ref 135–145)
Total Protein: 6.5 g/dL (ref 6.0–8.3)

## 2015-03-10 LAB — LIPID PANEL
Cholesterol: 142 mg/dL (ref 0–200)
HDL: 66.8 mg/dL (ref >39.00)
LDL CALC: 66 mg/dL (ref 0–99)
NonHDL: 74.94
TRIGLYCERIDES: 47 mg/dL (ref 0.0–149.0)
Total CHOL/HDL Ratio: 2
VLDL: 9.4 mg/dL (ref 0.0–40.0)

## 2015-03-10 LAB — VITAMIN D 25 HYDROXY (VIT D DEFICIENCY, FRACTURES): VITD: 13.2 ng/mL — ABNORMAL LOW (ref 30.00–100.00)

## 2015-03-10 LAB — VITAMIN B12: Vitamin B-12: 175 pg/mL — ABNORMAL LOW (ref 211–911)

## 2015-03-13 ENCOUNTER — Encounter: Payer: Commercial Managed Care - HMO | Admitting: Family Medicine

## 2015-03-17 ENCOUNTER — Encounter: Payer: Self-pay | Admitting: Family Medicine

## 2015-03-17 ENCOUNTER — Ambulatory Visit (INDEPENDENT_AMBULATORY_CARE_PROVIDER_SITE_OTHER): Payer: Commercial Managed Care - HMO | Admitting: Family Medicine

## 2015-03-17 VITALS — BP 110/60 | HR 80 | Temp 97.5°F | Ht 58.5 in | Wt 121.0 lb

## 2015-03-17 DIAGNOSIS — E785 Hyperlipidemia, unspecified: Secondary | ICD-10-CM

## 2015-03-17 DIAGNOSIS — F4321 Adjustment disorder with depressed mood: Secondary | ICD-10-CM

## 2015-03-17 DIAGNOSIS — Z0000 Encounter for general adult medical examination without abnormal findings: Secondary | ICD-10-CM | POA: Diagnosis not present

## 2015-03-17 DIAGNOSIS — I10 Essential (primary) hypertension: Secondary | ICD-10-CM

## 2015-03-17 DIAGNOSIS — D519 Vitamin B12 deficiency anemia, unspecified: Secondary | ICD-10-CM | POA: Diagnosis not present

## 2015-03-17 DIAGNOSIS — Z7189 Other specified counseling: Secondary | ICD-10-CM | POA: Insufficient documentation

## 2015-03-17 DIAGNOSIS — E559 Vitamin D deficiency, unspecified: Secondary | ICD-10-CM

## 2015-03-17 DIAGNOSIS — M810 Age-related osteoporosis without current pathological fracture: Secondary | ICD-10-CM

## 2015-03-17 MED ORDER — BUPROPION HCL ER (SR) 150 MG PO TB12: 150.0000 mg | ORAL_TABLET | Freq: Every day | ORAL | Status: DC

## 2015-03-17 MED ORDER — VITAMIN D (ERGOCALCIFEROL) 1.25 MG (50000 UNIT) PO CAPS: 50000.0000 [IU] | ORAL_CAPSULE | ORAL | Status: DC

## 2015-03-17 MED ORDER — ALENDRONATE SODIUM 70 MG PO TABS: 70.0000 mg | ORAL_TABLET | ORAL | Status: DC

## 2015-03-17 MED ORDER — DULOXETINE HCL 60 MG PO CPEP: 60.0000 mg | ORAL_CAPSULE | Freq: Every day | ORAL | Status: DC

## 2015-03-17 NOTE — Assessment & Plan Note (Signed)
Chronic, stable. Continue lipitor.  

## 2015-03-17 NOTE — Assessment & Plan Note (Signed)
Preventative protocols reviewed and updated unless pt declined. Discussed healthy diet and lifestyle.  

## 2015-03-17 NOTE — Patient Instructions (Addendum)
Bring me copy of your living will. B12 shot today - then start monthly for 8 months then start 524mcg daily over the counter. Start vitamin D 50,000 units weekly - sent to pharmacy Good to see you today, call us with questions. Return as needed or in 1 year for next wellness visit.

## 2015-03-17 NOTE — Assessment & Plan Note (Signed)
Lower cymbalta dose ineffective - back to 60mg  daily. Continue this along with wellbutrin 150mg  SR once daily.

## 2015-03-17 NOTE — Assessment & Plan Note (Signed)
B12 outright low today. Start B12 shot - first one today.

## 2015-03-17 NOTE — Progress Notes (Signed)
Pre visit review using our clinic review tool, if applicable. No additional management support is needed unless otherwise documented below in the visit note. 

## 2015-03-17 NOTE — Assessment & Plan Note (Signed)

## 2015-03-17 NOTE — Assessment & Plan Note (Signed)
Chronic, stable. Continue lisinopril hctz.  

## 2015-03-17 NOTE — Assessment & Plan Note (Signed)
Continue fosamax - tolerating well. Change vit D to 50,000 IU weekly.

## 2015-03-17 NOTE — Progress Notes (Signed)
BP 110/60 mmHg  Pulse 80  Temp(Src) 97.5 F (36.4 C) (Tympanic)  Ht 4' 10.5" (1.486 m)  Wt 121 lb (54.885 kg)  BMI 24.86 kg/m2  SpO2 98%   CC: medicare wellness visit  Subjective:    Patient ID: Courtney Thomas, female    DOB: 30-Dec-1945, 69 y.o.   MRN: [id number]  HPI: Courtney Thomas is a 69 y.o. female presenting on 03/17/2015 for Annual Exam   Vision screen with eye doctor Hearing screen passed No falls in last year Depression under treatment PHQ9 = 7  Preventative: Colonoscopy - 07/2013 WNL 10 yr f/u ([place]) Well woman - with GYN, states pap smear - Nov 18, 2013 WNL ([facility] in [place] - to retire)  Mammogram - [facility] 01/2015 WNL.  DEXA 03/2014 T -3.5 spine, -3.0 hip, on fosamax since 03/2014 and prior  Does not receive flu shot - egg allergy  Pneumovax ?egg allergy Tdap 19-Nov-2011 with bad reaction  zostavax - unsure about components Advanced directives: Courtney Thomas is daughter Courtney Thomas. To bring me copy of living will.  Lives alone - husband passed away 11/18/2013. 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 Interim medical history since our last visit reviewed. Allergies and medications reviewed and updated. Current Outpatient Prescriptions on File Prior to Visit  Medication Sig  . atorvastatin (LIPITOR) 20 MG tablet Take 20 mg by mouth daily.  . cetirizine (ZYRTEC) 10 MG tablet Take 1 tablet (10 mg total) by mouth daily.  . Cholecalciferol (VITAMIN D) 2000 UNITS CAPS Take 1 capsule by mouth daily.  Marland Kitchen lisinopril-hydrochlorothiazide (PRINZIDE,ZESTORETIC) 20-12.5 MG per tablet Take 1 tablet by mouth daily.  Marland Kitchen zolpidem (AMBIEN) 10 MG tablet TAKE ONE TABLET BY MOUTH AT BEDTIME AS NEEDED   No current facility-administered medications on file prior to visit.    Review  of Systems  Constitutional: Negative for fever, chills, activity change, appetite change, fatigue and unexpected weight change.  HENT: Negative for hearing loss.   Eyes: Negative for visual disturbance.  Respiratory: Negative for cough, chest tightness, shortness of breath and wheezing.   Cardiovascular: Negative for chest pain, palpitations and leg swelling.  Gastrointestinal: Negative for nausea, vomiting, abdominal pain, diarrhea, constipation, blood in stool and abdominal distention.  Genitourinary: Negative for hematuria and difficulty urinating.  Musculoskeletal: Negative for myalgias, arthralgias and neck pain.  Skin: Negative for rash.  Neurological: Negative for dizziness, seizures, syncope and headaches.  Hematological: Negative for adenopathy. Does not bruise/bleed easily.  Psychiatric/Behavioral: Negative for dysphoric mood. The patient is not nervous/anxious.    Per HPI unless specifically indicated above     Objective:    BP 110/60 mmHg  Pulse 80  Temp(Src) 97.5 F (36.4 C) (Tympanic)  Ht 4' 10.5" (1.486 m)  Wt 121 lb (54.885 kg)  BMI 24.86 kg/m2  SpO2 98%  Wt Readings from Last 3 Encounters:  03/17/15 121 lb (54.885 kg)  01/27/15 121 lb 8 oz (55.112 kg)  09/08/14 118 lb 8 oz (53.751 kg)    Physical Exam  Constitutional: She is oriented to person, place, and time. She appears well-developed and well-nourished. No distress.  HENT:  Head: Normocephalic and atraumatic.  Right Ear: Hearing, tympanic membrane, external ear and ear canal normal.  Left Ear: Hearing, tympanic membrane, external ear and ear canal normal.  Nose: Nose normal.  Mouth/Throat: Uvula is midline, oropharynx is clear and moist and mucous membranes are normal. No oropharyngeal exudate, posterior oropharyngeal edema or posterior oropharyngeal erythema.  Eyes: Conjunctivae and EOM are normal. Pupils are equal, round, and reactive to light. No scleral icterus.  Neck: Normal range of motion. Neck  supple. Carotid bruit is not present. No thyromegaly present.  Cardiovascular: Normal rate, regular rhythm, normal heart sounds and intact distal pulses.   No murmur heard. Pulses:      Radial pulses are 2+ on the right side, and 2+ on the left side.  Pulmonary/Chest: Effort normal and breath sounds normal. No respiratory distress. She has no wheezes. She has no rales.  Breast - deferred per GYN  Abdominal: Soft. Bowel sounds are normal. She exhibits no distension and no mass. There is no tenderness. There is no rebound and no guarding.  Genitourinary:  GYN per GYN  Musculoskeletal: Normal range of motion. She exhibits no edema.  Lymphadenopathy:    She has no cervical adenopathy.  Neurological: She is alert and oriented to person, place, and time.  CN grossly intact, station and gait intact Recall 3/3 Calculation trouble with serial 7s, D-L-[id number]-W 5/5  Skin: Skin is warm and dry. No rash noted.  Psychiatric: She has a normal mood and affect. Her behavior is normal. Judgment and thought content normal.  Nursing note and vitals reviewed.  Results for orders placed or performed in visit on 03/10/15  Vitamin B12  Result Value Ref Range   Vitamin B-12 175 (L) 211 - 911 pg/mL  Vit D  25 hydroxy (rtn osteoporosis monitoring)  Result Value Ref Range   VITD 13.20 (L) 30.00 - 100.00 ng/mL  Lipid panel  Result Value Ref Range   Cholesterol 142 0 - 200 mg/dL   Triglycerides 47.0 0.0 - 149.0 mg/dL   HDL 66.80 >39.00 mg/dL   VLDL 9.4 0.0 - 40.0 mg/dL   LDL Cholesterol 66 0 - 99 mg/dL   Total CHOL/HDL Ratio 2    NonHDL 74.94   Comprehensive metabolic panel  Result Value Ref Range   Sodium 137 135 - 145 mEq/L   Potassium 4.2 3.5 - 5.1 mEq/L   Chloride 102 96 - 112 mEq/L   CO2 29 19 - 32 mEq/L   Glucose, Bld 97 70 - 99 mg/dL   BUN 32 (H) 6 - 23 mg/dL   Creatinine, Ser 0.83 0.40 - 1.20 mg/dL   Total Bilirubin 0.3 0.2 - 1.2 mg/dL   Alkaline Phosphatase 98 39 - 117 U/L   AST 8 0 - 37 U/L     ALT 13 0 - 35 U/L   Total Protein 6.5 6.0 - 8.3 g/dL   Albumin 4.0 3.5 - 5.2 g/dL   Calcium 9.3 8.4 - 10.5 mg/dL   GFR 72.76 >60.00 mL/min      Assessment & Plan:   Problem List Items Addressed This Visit    Hypertension    Chronic, stable. Continue lisinopril hctz.      Hyperlipidemia    Chronic, stable. Continue lipitor.      Adjustment disorder with depressed mood    Lower cymbalta dose ineffective - back to 60mg  daily. Continue this along with wellbutrin 150mg  SR once daily.      B12 deficiency anemia    B12 outright low today. Start B12 shot - first one today.      Relevant Medications   cyanocobalamin (,VITAMIN B-12,) 1000 MCG/ML injection   Vitamin D deficiency    Low  vit D despite 2000 IU OTC daily. Will start 50,000 IU weekly. Sent to pharmacy.      Medicare annual wellness visit, subsequent - Primary    I have personally reviewed the Medicare Annual Wellness questionnaire and have noted 1. The patient's medical and social history 2. Their use of alcohol, tobacco or illicit drugs 3. Their current medications and supplements 4. The patient's functional ability including ADL's, fall risks, home safety risks and hearing or visual impairment. Cognitive function has been assessed and addressed as indicated.  5. Diet and physical activity 6. Evidence for depression or mood disorders The patients weight, height, BMI have been recorded in the chart. I have made referrals, counseling and provided education to the patient based on review of the above and I have provided the pt with a written personalized care plan for preventive services. Provider list updated.. See scanned questionairre as needed for further documentation. Reviewed preventative protocols and updated unless pt declined.       Osteoporosis    Continue fosamax - tolerating well. Change vit D to 50,000 IU weekly.      Relevant Medications   alendronate (FOSAMAX) 70 MG tablet   Vitamin D,  Ergocalciferol, (DRISDOL) 50000 UNITS CAPS capsule   Advanced care planning/counseling discussion    Advanced directives: Courtney Thomas is daughter Courtney Thomas. To bring me copy of living will.       Health care maintenance    Preventative protocols reviewed and updated unless pt declined. Discussed healthy diet and lifestyle.           Follow up plan: Return in about 1 year (around 03/16/2016), or as needed, for medicare wellness visit.

## 2015-03-17 NOTE — Assessment & Plan Note (Addendum)
Advanced directives: Chauncey Reading is daughter [name]. To bring me copy of living will.

## 2015-03-17 NOTE — Assessment & Plan Note (Signed)
Low vit D despite 2000 IU OTC daily. Will start 50,000 IU weekly. Sent to pharmacy.

## 2015-03-18 ENCOUNTER — Encounter (INDEPENDENT_AMBULATORY_CARE_PROVIDER_SITE_OTHER): Payer: Self-pay

## 2015-03-18 DIAGNOSIS — Z0000 Encounter for general adult medical examination without abnormal findings: Secondary | ICD-10-CM | POA: Diagnosis not present

## 2015-03-18 DIAGNOSIS — D519 Vitamin B12 deficiency anemia, unspecified: Secondary | ICD-10-CM

## 2015-03-18 MED ORDER — CYANOCOBALAMIN 1000 MCG/ML IJ SOLN
1000.0000 ug | Freq: Once | INTRAMUSCULAR | Status: AC
  Administered 2015-03-18: 1000 ug via INTRAMUSCULAR

## 2015-03-18 NOTE — Addendum Note (Signed)
Addended by: Despina Hidden on: 03/18/2015 09:50 AM   Modules accepted: Orders

## 2015-04-21 ENCOUNTER — Ambulatory Visit (INDEPENDENT_AMBULATORY_CARE_PROVIDER_SITE_OTHER): Payer: Commercial Managed Care - HMO | Admitting: *Deleted

## 2015-04-21 DIAGNOSIS — E538 Deficiency of other specified B group vitamins: Secondary | ICD-10-CM | POA: Diagnosis not present

## 2015-04-21 MED ORDER — CYANOCOBALAMIN 1000 MCG/ML IJ SOLN
1000.0000 ug | Freq: Once | INTRAMUSCULAR | Status: AC
  Administered 2015-04-21: 1000 ug via INTRAMUSCULAR

## 2015-05-05 ENCOUNTER — Other Ambulatory Visit: Payer: Self-pay | Admitting: Family Medicine

## 2015-05-05 NOTE — Telephone Encounter (Signed)
Rx called in as directed.   

## 2015-05-05 NOTE — Telephone Encounter (Signed)
Ok to refill 

## 2015-05-05 NOTE — Telephone Encounter (Signed)
plz phone in. 

## 2015-05-27 ENCOUNTER — Ambulatory Visit (INDEPENDENT_AMBULATORY_CARE_PROVIDER_SITE_OTHER): Payer: Commercial Managed Care - HMO | Admitting: *Deleted

## 2015-05-27 DIAGNOSIS — E538 Deficiency of other specified B group vitamins: Secondary | ICD-10-CM | POA: Diagnosis not present

## 2015-05-27 MED ORDER — CYANOCOBALAMIN 1000 MCG/ML IJ SOLN
1000.0000 ug | Freq: Once | INTRAMUSCULAR | Status: AC
  Administered 2015-05-27: 1000 ug via INTRAMUSCULAR

## 2015-06-19 ENCOUNTER — Other Ambulatory Visit: Payer: Self-pay | Admitting: *Deleted

## 2015-06-19 MED ORDER — ATORVASTATIN CALCIUM 20 MG PO TABS: 20.0000 mg | ORAL_TABLET | Freq: Every day | ORAL | Status: DC

## 2015-07-01 ENCOUNTER — Ambulatory Visit (INDEPENDENT_AMBULATORY_CARE_PROVIDER_SITE_OTHER): Payer: Commercial Managed Care - HMO | Admitting: *Deleted

## 2015-07-01 DIAGNOSIS — E538 Deficiency of other specified B group vitamins: Secondary | ICD-10-CM

## 2015-07-01 MED ORDER — CYANOCOBALAMIN 1000 MCG/ML IJ SOLN
1000.0000 ug | Freq: Once | INTRAMUSCULAR | Status: AC
  Administered 2015-07-01: 1000 ug via INTRAMUSCULAR

## 2015-07-31 ENCOUNTER — Telehealth: Payer: Self-pay | Admitting: Family Medicine

## 2015-07-31 NOTE — Telephone Encounter (Signed)
Pt would like referral to [facility] for arthritis in hips. She also states she has bone spurs on pelvic region shown on xray from [facility]. Please advise

## 2015-08-03 NOTE — Telephone Encounter (Signed)
I'd like to see her for this. If having hip pain would also suggest she hold fosamax for now.

## 2015-08-04 ENCOUNTER — Ambulatory Visit (INDEPENDENT_AMBULATORY_CARE_PROVIDER_SITE_OTHER): Payer: Commercial Managed Care - HMO | Admitting: *Deleted

## 2015-08-04 DIAGNOSIS — E538 Deficiency of other specified B group vitamins: Secondary | ICD-10-CM | POA: Diagnosis not present

## 2015-08-04 MED ORDER — CYANOCOBALAMIN 1000 MCG/ML IJ SOLN
1000.0000 ug | Freq: Once | INTRAMUSCULAR | Status: AC
  Administered 2015-08-04: 1000 ug via INTRAMUSCULAR

## 2015-08-04 NOTE — Telephone Encounter (Signed)
Message left advising patient to call and schedule appt and to hold fosamax.

## 2015-08-05 NOTE — Telephone Encounter (Signed)
Patient notified and appt scheduled.

## 2015-08-06 ENCOUNTER — Ambulatory Visit (INDEPENDENT_AMBULATORY_CARE_PROVIDER_SITE_OTHER): Payer: Commercial Managed Care - HMO | Admitting: Family Medicine

## 2015-08-06 ENCOUNTER — Encounter: Payer: Self-pay | Admitting: Family Medicine

## 2015-08-06 ENCOUNTER — Ambulatory Visit (INDEPENDENT_AMBULATORY_CARE_PROVIDER_SITE_OTHER)
Admission: RE | Admit: 2015-08-06 | Discharge: 2015-08-06 | Disposition: A | Discharge status: Home / Self Care | Payer: Commercial Managed Care - HMO | Source: Ambulatory Visit | Attending: Family Medicine | Admitting: Family Medicine

## 2015-08-06 VITALS — BP 108/70 | HR 76 | Temp 97.7°F | Wt 122.8 lb

## 2015-08-06 DIAGNOSIS — M79605 Pain in left leg: Principal | ICD-10-CM

## 2015-08-06 DIAGNOSIS — M545 Low back pain, unspecified: Secondary | ICD-10-CM

## 2015-08-06 DIAGNOSIS — M810 Age-related osteoporosis without current pathological fracture: Secondary | ICD-10-CM

## 2015-08-06 MED ORDER — TRAMADOL HCL 50 MG PO TABS: 25.0000 mg | ORAL_TABLET | Freq: Two times a day (BID) | ORAL | Status: DC | PRN

## 2015-08-06 NOTE — Patient Instructions (Signed)
I think last week's pain was flare of osteoarthritis or "wear and tear" arthritis. Treat with tylenol and/or naprosyn, use tramadol prescription provided today for breakthrough pain. Start at 1/2 tablet to ensure tolerated ok.  Xray of sacro iliac joint today to evaluate for other causes of arthritis - if abnormal I will ask you to return for lab visit only.

## 2015-08-06 NOTE — Progress Notes (Signed)
Pre visit review using our clinic review tool, if applicable. No additional management support is needed unless otherwise documented below in the visit note. 

## 2015-08-06 NOTE — Assessment & Plan Note (Addendum)
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  Discussed etiology and expected course of illness. rec tylenol for pain, heating pad, staying as active as able, sparing NSAID + tramadol. Discussed sedation precautions and other side effects of tramadol, rec use for breakthrough pain, and start at 1/2 tab to ensure tolerated well.  Check sacroiliac joint xray to eval for ankylosing spondylitis. If abnormal, consider further labwork to eval for AS. Update if not improving with treatment as expected.

## 2015-08-06 NOTE — Progress Notes (Signed)
BP 108/70 mmHg  Pulse 76  Temp(Src) 97.7 F (36.5 C) (Oral)  Wt 122 lb 12 oz (55.679 kg)  SpO2 94%   CC: hip pain  Subjective:    Patient ID: Courtney Thomas, female    DOB: May 26, 1946, 70 y.o.   MRN: [id number]  HPI: SUMMAR COOLE is a 70 y.o. female presenting on 08/06/2015 for Hip Pain   Pt requested rheum referral for hip arthritis. I had not evaluated her for this in the past so suggested she come in for evaluation.  Sudden onset 12/23 of left lower back pain last week - lasted 3 days. Denies inciting trauma/injury or falls. She has been self treating with advil and naprosyn. Also treated with heating pad which helped. + shooting pain down left leg.   She had L hip and pelvic xray 08/2014 at [facility] showing mild bilateral hip osteoarthritis. I saw her in follow up on 09/2014 and thought symptoms more related to lumbar DDD and recommended tylenol 500mg  TID + aleve prn. We also discussed supplement use (vitamin D, osteo bi-flex) and encouraged regular walking. I also recommended continued fosamax use which she continues.   Known IP osteoarthritis. H/o 1st CMC pain, neck pain but no other joints affected. Denies synovitis.  No fevers/chills, bowel/bladder incontinence.   Relevant past medical, surgical, family and social history reviewed and updated as indicated. Interim medical history since our last visit reviewed. Allergies and medications reviewed and updated. Current Outpatient Prescriptions on File Prior to Visit  Medication Sig  . alendronate (FOSAMAX) 70 MG tablet Take 1 tablet (70 mg total) by mouth every 7 (seven) days. Take with a full glass of water on an empty stomach.  Marland Kitchen atorvastatin (LIPITOR) 20 MG tablet Take 1 tablet (20 mg total) by mouth daily.  Marland Kitchen buPROPion (WELLBUTRIN SR) 150 MG 12 hr tablet Take 1 tablet (150 mg total) by mouth daily.  . cetirizine (ZYRTEC) 10 MG tablet Take 1 tablet (10 mg total) by mouth daily.  . Cholecalciferol (VITAMIN D) 2000 UNITS CAPS Take 1  capsule by mouth daily.  . DULoxetine (CYMBALTA) 60 MG capsule Take 1 capsule (60 mg total) by mouth daily.  Marland Kitchen lisinopril-hydrochlorothiazide (PRINZIDE,ZESTORETIC) 20-12.5 MG per tablet Take 1 tablet by mouth daily.  . Vitamin D, Ergocalciferol, (DRISDOL) 50000 UNITS CAPS capsule Take 1 capsule (50,000 Units total) by mouth every 7 (seven) days.  Marland Kitchen zolpidem (AMBIEN) 10 MG tablet TAKE ONE TABLET AT BEDTIME AS NEEDED   No current facility-administered medications on file prior to visit.    Review of Systems Per HPI unless specifically indicated in ROS section     Objective:    BP 108/70 mmHg  Pulse 76  Temp(Src) 97.7 F (36.5 C) (Oral)  Wt 122 lb 12 oz (55.679 kg)  SpO2 94%  Wt Readings from Last 3 Encounters:  08/06/15 122 lb 12 oz (55.679 kg)  03/17/15 121 lb (54.885 kg)  01/27/15 121 lb 8 oz (55.112 kg)    Physical Exam  Constitutional: She appears well-developed and well-nourished. No distress.  Musculoskeletal: She exhibits no edema.  No pain midline spine No paraspinous mm tenderness Mildly positive SLR on left, negative on right No pain with int/ext rotation at hip except with int rotation at lateral left hip. + FABER on left. + pain at L SIJ, less pain at sciatic notch, less pain at [place] on left  Skin: Skin is warm and dry. No rash noted.  Nursing note and vitals reviewed.  Assessment & Plan:   Problem List Items Addressed This Visit    Osteoporosis    Ok to restart fosamax as pain not consistent with atypical hip fracture.       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  Discussed etiology and expected course of illness. rec tylenol for pain, heating pad, staying as active as able, sparing NSAID + tramadol. Discussed sedation precautions and other side effects of tramadol, rec use for breakthrough pain, and start at 1/2 tab to ensure tolerated well.  Check sacroiliac joint xray  to eval for ankylosing spondylitis. If abnormal, consider further labwork to eval for AS. Update if not improving with treatment as expected.       Relevant Medications   naproxen sodium (ANAPROX) 220 MG tablet   traMADol (ULTRAM) 50 MG tablet   Other Relevant Orders   DG Si Joints       Follow up plan: Return if symptoms worsen or fail to improve.

## 2015-08-06 NOTE — Assessment & Plan Note (Signed)
Ok to restart fosamax as pain not consistent with atypical hip fracture.

## 2015-08-24 ENCOUNTER — Telehealth: Payer: Self-pay

## 2015-08-24 NOTE — Telephone Encounter (Signed)
Pt has allergy to eggs and pt wants to know if she can take prevnar 13, pneumovax and shingles vaccine; noted questioned in annual exam on 03/17/15. Pt request cb.

## 2015-08-24 NOTE — Telephone Encounter (Signed)
Ok to take pneumococcal vaccines. Shingles vaccine should be ok as well - would have her check with insurance about cost.

## 2015-08-25 NOTE — Telephone Encounter (Signed)
Patient notified and nurse visit scheduled.

## 2015-08-27 ENCOUNTER — Ambulatory Visit (INDEPENDENT_AMBULATORY_CARE_PROVIDER_SITE_OTHER): Payer: Commercial Managed Care - HMO | Admitting: *Deleted

## 2015-08-27 DIAGNOSIS — Z23 Encounter for immunization: Secondary | ICD-10-CM

## 2015-09-02 ENCOUNTER — Other Ambulatory Visit: Payer: Self-pay | Admitting: Family Medicine

## 2015-09-03 NOTE — Telephone Encounter (Signed)
Ok to refill 

## 2015-09-03 NOTE — Telephone Encounter (Signed)
plz phone in. 

## 2015-09-03 NOTE — Telephone Encounter (Signed)
Rx called in as directed.   

## 2015-09-11 ENCOUNTER — Ambulatory Visit (INDEPENDENT_AMBULATORY_CARE_PROVIDER_SITE_OTHER): Payer: Commercial Managed Care - HMO | Admitting: *Deleted

## 2015-09-11 DIAGNOSIS — E538 Deficiency of other specified B group vitamins: Secondary | ICD-10-CM | POA: Diagnosis not present

## 2015-09-11 MED ORDER — CYANOCOBALAMIN 1000 MCG/ML IJ SOLN
1000.0000 ug | Freq: Once | INTRAMUSCULAR | Status: AC
  Administered 2015-09-11: 1000 ug via INTRAMUSCULAR

## 2015-10-03 ENCOUNTER — Other Ambulatory Visit: Payer: Self-pay | Admitting: Family Medicine

## 2015-10-08 DIAGNOSIS — H524 Presbyopia: Secondary | ICD-10-CM | POA: Diagnosis not present

## 2015-10-08 DIAGNOSIS — H25813 Combined forms of age-related cataract, bilateral: Secondary | ICD-10-CM | POA: Diagnosis not present

## 2015-10-08 DIAGNOSIS — H5210 Myopia, unspecified eye: Secondary | ICD-10-CM | POA: Diagnosis not present

## 2015-10-14 ENCOUNTER — Ambulatory Visit (INDEPENDENT_AMBULATORY_CARE_PROVIDER_SITE_OTHER): Payer: Commercial Managed Care - HMO

## 2015-10-14 DIAGNOSIS — E538 Deficiency of other specified B group vitamins: Secondary | ICD-10-CM

## 2015-10-14 DIAGNOSIS — Z0100 Encounter for examination of eyes and vision without abnormal findings: Secondary | ICD-10-CM | POA: Diagnosis not present

## 2015-10-14 MED ORDER — CYANOCOBALAMIN 1000 MCG/ML IJ SOLN
1000.0000 ug | Freq: Once | INTRAMUSCULAR | Status: AC
  Administered 2015-10-14: 1000 ug via INTRAMUSCULAR

## 2015-11-17 ENCOUNTER — Ambulatory Visit (INDEPENDENT_AMBULATORY_CARE_PROVIDER_SITE_OTHER): Payer: Commercial Managed Care - HMO | Admitting: *Deleted

## 2015-11-17 DIAGNOSIS — E538 Deficiency of other specified B group vitamins: Secondary | ICD-10-CM

## 2015-11-17 MED ORDER — CYANOCOBALAMIN 1000 MCG/ML IJ SOLN
1000.0000 ug | Freq: Once | INTRAMUSCULAR | Status: AC
  Administered 2015-11-17: 1000 ug via INTRAMUSCULAR

## 2015-12-02 ENCOUNTER — Telehealth: Payer: Self-pay

## 2015-12-02 NOTE — Telephone Encounter (Signed)
Pt left v/m wants to know if there is a blood test that detects early signs of parkinsons. Pt request cb.

## 2015-12-05 NOTE — Telephone Encounter (Signed)
Unfortunately no blood test that I know of we can currently order for this.  Clinical diagnosis. Does she have any specific concerns currently?

## 2015-12-06 ENCOUNTER — Emergency Department: Payer: Commercial Managed Care - HMO

## 2015-12-06 ENCOUNTER — Emergency Department
Admission: EM | Admit: 2015-12-06 | Discharge: 2015-12-06 | Disposition: A | Discharge status: Home / Self Care | Payer: Commercial Managed Care - HMO | Attending: Student | Admitting: Student

## 2015-12-06 DIAGNOSIS — I10 Essential (primary) hypertension: Secondary | ICD-10-CM | POA: Insufficient documentation

## 2015-12-06 DIAGNOSIS — M810 Age-related osteoporosis without current pathological fracture: Secondary | ICD-10-CM | POA: Insufficient documentation

## 2015-12-06 DIAGNOSIS — R0789 Other chest pain: Secondary | ICD-10-CM | POA: Insufficient documentation

## 2015-12-06 DIAGNOSIS — E785 Hyperlipidemia, unspecified: Secondary | ICD-10-CM | POA: Insufficient documentation

## 2015-12-06 DIAGNOSIS — Z79899 Other long term (current) drug therapy: Secondary | ICD-10-CM | POA: Diagnosis not present

## 2015-12-06 DIAGNOSIS — R079 Chest pain, unspecified: Secondary | ICD-10-CM | POA: Diagnosis not present

## 2015-12-06 DIAGNOSIS — Z87891 Personal history of nicotine dependence: Secondary | ICD-10-CM | POA: Insufficient documentation

## 2015-12-06 DIAGNOSIS — J45909 Unspecified asthma, uncomplicated: Secondary | ICD-10-CM | POA: Diagnosis not present

## 2015-12-06 LAB — BASIC METABOLIC PANEL
ANION GAP: 13 (ref 5–15)
BUN: 16 mg/dL (ref 6–20)
CALCIUM: 9.4 mg/dL (ref 8.9–10.3)
CO2: 22 mmol/L (ref 22–32)
CREATININE: 0.81 mg/dL (ref 0.44–1.00)
Chloride: 103 mmol/L (ref 101–111)
GLUCOSE: 93 mg/dL (ref 65–99)
Potassium: 3.6 mmol/L (ref 3.5–5.1)
Sodium: 138 mmol/L (ref 135–145)

## 2015-12-06 LAB — CBC
HCT: 38.9 % (ref 35.0–47.0)
HEMOGLOBIN: 13 g/dL (ref 12.0–16.0)
MCH: 29.7 pg (ref 26.0–34.0)
MCHC: 33.4 g/dL (ref 32.0–36.0)
MCV: 88.9 fL (ref 80.0–100.0)
PLATELETS: 237 10*3/uL (ref 150–440)
RBC: 4.38 MIL/uL (ref 3.80–5.20)
RDW: 13.6 % (ref 11.5–14.5)
WBC: 5.3 10*3/uL (ref 3.6–11.0)

## 2015-12-06 LAB — TROPONIN I

## 2015-12-06 MED ORDER — IBUPROFEN 400 MG PO TABS: 600.0000 mg | ORAL_TABLET | Freq: Four times a day (QID) | ORAL | Status: DC | PRN

## 2015-12-06 MED ORDER — IBUPROFEN 400 MG PO TABS
400.0000 mg | ORAL_TABLET | Freq: Once | ORAL | Status: AC
  Administered 2015-12-06: 400 mg via ORAL
  Filled 2015-12-06: qty 1

## 2015-12-06 NOTE — ED Provider Notes (Signed)
[facility] Emergency Department Provider Note   ____________________________________________  Time seen: Approximately 11:23 AM  I have reviewed the triage vital signs and the nursing notes.   HISTORY  Chief Complaint Chest Pain    HPI Courtney Thomas is a 70 y.o. female with hypertension, hyperlipidemia, asthma who presents for evaluation of constant aching left chest wall pain since yesterday evening, gradual onset, constant since onset, currently mild to moderate. Patient reports that the pain is worse when she moves her left arm, when she twists her torso, when she takes a deep breath and when she coughs. No fevers or chills. No shortness of breath. Pain does not radiate to the back or down towards the feet. No sudden sweating and pain is not worse with exertion. She denies any risk factors for PE including no personal history of PE or DVT, no recent prolonged period of Immobilization, hemoptysis, exogenous estrogen use or recent surgery. She denies any history of coronary artery disease.   Past Medical History  Diagnosis Date  . Asthma   . History of chicken pox   . Frequent headaches   . Hypertension   . Hyperlipidemia   . Insomnia   . Seasonal allergic rhinitis   . Adjustment disorder with depressed mood   . Osteoporosis     latest dexa 03/2014 - prior on fosamax for years  . B12 deficiency anemia 2014    mild    Patient Active Problem List   Diagnosis Date Noted  . Low back pain radiating to left lower extremity 08/06/2015  . Advanced care planning/counseling discussion 03/17/2015  . Health care maintenance 03/17/2015  . Osteoarthritis of lumbar spine 09/08/2014  . Medicare annual wellness visit, subsequent 02/21/2014  . Osteoporosis   . Vitamin D deficiency 02/13/2014  . B12 deficiency anemia   . Hypertension   . Hyperlipidemia   . Insomnia   . Seasonal allergic rhinitis   . Adjustment disorder with depressed mood     Past  Surgical History  Procedure Laterality Date  . Abdominal hysterectomy  1994  . Tonsillectomy  1970  . Colonoscopy  07/2013    WNL per patient ([place])  . Nasal sinus surgery      deviated septum  . Dexa  03/2014    T score spine -3.5, hip -2.0  . Breast biopsy  1971  . Breast cyst aspiration      Current Outpatient Rx  Name  Route  Sig  Dispense  Refill  . alendronate (FOSAMAX) 70 MG tablet   Oral   Take 1 tablet (70 mg total) by mouth every 7 (seven) days. Take with a full glass of water on an empty stomach. Patient taking differently: Take 70 mg by mouth every 7 (seven) days. Take with a full glass of water on an empty stomach. Take on Sunday.   12 tablet   3   . atorvastatin (LIPITOR) 20 MG tablet   Oral   Take 1 tablet (20 mg total) by mouth daily.   30 tablet   11   . buPROPion (WELLBUTRIN SR) 150 MG 12 hr tablet   Oral   Take 1 tablet (150 mg total) by mouth daily.   30 tablet   11   . cetirizine (ZYRTEC) 10 MG tablet   Oral   Take 1 tablet (10 mg total) by mouth daily.   90 tablet   3   . Cholecalciferol (VITAMIN D) 2000 UNITS CAPS   Oral  Take 1 capsule by mouth daily.         . DULoxetine (CYMBALTA) 60 MG capsule   Oral   Take 1 capsule (60 mg total) by mouth daily.   30 capsule   11   . lisinopril-hydrochlorothiazide (PRINZIDE,ZESTORETIC) 20-12.5 MG tablet      TAKE ONE (1) TABLET EACH DAY   90 tablet   3   . naproxen sodium (ANAPROX) 220 MG tablet   Oral   Take 220 mg by mouth every 4 (four) hours as needed. For arthritis.         Marland Kitchen traMADol (ULTRAM) 50 MG tablet   Oral   Take 0.5-1 tablets (25-50 mg total) by mouth 2 (two) times daily as needed for moderate pain.   30 tablet   0   . Vitamin D, Ergocalciferol, (DRISDOL) 50000 UNITS CAPS capsule   Oral   Take 1 capsule (50,000 Units total) by mouth every 7 (seven) days. Patient taking differently: Take 50,000 Units by mouth every 7 (seven) days. Take on Sunday.   12 capsule   3   .  zolpidem (AMBIEN) 10 MG tablet      TAKE ONE TABLET AT BEDTIME AS NEEDED Patient taking differently: TAKE ONE TABLET AT BEDTIME.   30 tablet   3     Allergies Eggs or egg-derived products and Codeine  Family History  Problem Relation Age of Onset  . Cancer Mother 73    lung met to brain, smoker  . Stroke Mother   . Arthritis Father   . Arthritis Sister   . Cancer Brother 12    pancreatic  . Diabetes Maternal Grandmother   . Alzheimer's disease Mother     Social History Social History  Substance Use Topics  . Smoking status: Former Research scientist (life sciences)  . Smokeless tobacco: Never Used     Comment: minimal smoking as a teenager  . Alcohol Use: No    Review of Systems Constitutional: No fever/chills Eyes: No visual changes. ENT: No sore throat. Cardiovascular: + chest pain. Respiratory: Denies shortness of breath. Gastrointestinal: No abdominal pain.  No nausea, no vomiting.  No diarrhea.  No constipation. Genitourinary: Negative for dysuria. Musculoskeletal: Negative for back pain. Skin: Negative for rash. Neurological: Negative for headaches, focal weakness or numbness.  10-point ROS otherwise negative.  ____________________________________________   PHYSICAL EXAM:  VITAL SIGNS: ED Triage Vitals  Enc Vitals Group     BP 12/06/15 1041 137/67 mmHg     Pulse Rate 12/06/15 1041 96     Resp 12/06/15 1041 16     Temp 12/06/15 1041 98.6 F (37 C)     Temp Source 12/06/15 1041 Oral     SpO2 12/06/15 1041 96 %     Weight 12/06/15 1041 122 lb (55.339 kg)     Height 12/06/15 1041 _0  (1.499 m)     Head Cir --      Peak Flow --      Pain Score 12/06/15 1042 9     Pain Loc --      Pain Edu? --      Excl. in [place]? --     Constitutional: Alert and oriented. Well appearing and in no acute distress. Eyes: Conjunctivae are normal. PERRL. EOMI. Head: Atraumatic. Nose: No congestion/rhinnorhea. Mouth/Throat: Mucous membranes are moist.  Oropharynx non-erythematous. Neck:  No stridor.Supple without meningismus. Cardiovascular: Normal rate, regular rhythm. Grossly normal heart sounds.  Good peripheral circulation. Respiratory: Normal respiratory effort.  No retractions. Lungs  CTAB. Gastrointestinal: Soft and nontender. No distention.  No CVA tenderness. Genitourinary: deferred Musculoskeletal: No lower extremity tenderness nor edema.  No joint effusions. Severe reproducible point tenderness to palpation in the left upper chest wall adjacent to the axilla; with palpation, the patient grimaces, flinches and attempts to move away from the painful stimulus. Neurologic:  Normal speech and language. No gross focal neurologic deficits are appreciated. No gait instability. Skin:  Skin is warm, dry and intact. No rash noted. Psychiatric: Mood and affect are normal. Speech and behavior are normal.  ____________________________________________   LABS (all labs ordered are listed, but only abnormal results are displayed)  Labs Reviewed  BASIC METABOLIC PANEL  CBC  TROPONIN I   ____________________________________________  EKG  ED ECG REPORT I, Joanne Gavel, the attending physician, personally viewed and interpreted this ECG.   Date: 12/06/2015  EKG Time: 10:33  Rate: 93  Rhythm: normal sinus rhythm  Axis: normal  Intervals:none  ST&T Change: No acute ST elevation, no acute ST depression.  ____________________________________________  RADIOLOGY  CXR IMPRESSION: Left base atelectasis. Lungs elsewhere clear. Cardiac silhouette within normal limits. ____________________________________________   PROCEDURES  Procedure(s) performed: None  Critical Care performed: No  ____________________________________________   INITIAL IMPRESSION / ASSESSMENT AND PLAN / ED COURSE  Pertinent labs & imaging results that were available during my care of the patient were reviewed by me and considered in my medical decision making (see chart for  details).  CONTESSA PREUSS is a 70 y.o. female with hypertension, hyperlipidemia, asthma who presents for evaluation of constant aching left chest wall pain since yesterday evening. On exam, she is very well-appearing and in no acute distress. Vital signs stable, she is afebrile. EKG is reassuring. Troponin negative and pain has been constant since last night. I doubt ACS. No risk factors for PE, no shortness of breath, hypoxia or tachypnea and I doubt that this represent acute PE. Clinically not consistent with acute aortic dissection. She has completely reproducible chest wall pain, crying out in pain and grimacing whenever I touch the left upper chest wall adjacent to the axilla. Suspect musculo skeletal pain, possibly rib strain. Pain is improved after Motrin. We discussed return precautions, need for close PCP follow-up and she is comfortable with the discharge plan. DC home. Chest x-ray shows no acute cardiopulmonary abnormality and the remainder of her labs including CBC and BMP are unremarkable. ____________________________________________   FINAL CLINICAL IMPRESSION(S) / ED DIAGNOSES  Final diagnoses:  Acute chest wall pain      NEW MEDICATIONS STARTED DURING THIS VISIT:  New Prescriptions   No medications on file     Note:  This document was prepared using Dragon voice recognition software and may include unintentional dictation errors.    Joanne Gavel, MD 12/06/15 1328

## 2015-12-06 NOTE — ED Notes (Signed)
Left side chest pain onset last night while watching TV.   She states the pain starts under the left arm and radiates to left breast.  Pain is reproducable with movement and she reports that it hurts to take a deep breath,    Denies nausea, diaphoresis, no hx of chest pain in the past.

## 2015-12-07 ENCOUNTER — Telehealth: Payer: Self-pay

## 2015-12-07 NOTE — Telephone Encounter (Signed)
I spoke with pt and she was seen [facility] ED on 12/06/15; taking ibuprofen but still having CP. Pt scheduled F/U ED 30 min appt on 12/08/15 at 3:30. If pt condition changes or worsens prior to appt pt will cb or go to ED.

## 2015-12-07 NOTE — Telephone Encounter (Signed)
Patient notified. She said her hands shaky (nothing new) but they are getting worse and sometimes she gets choked. She actually has an appt for an ER follow up with you tomorrow and will talk to you about it then.

## 2015-12-07 NOTE — Telephone Encounter (Signed)
PLEASE NOTE: All timestamps contained within this report are represented as [place] Standard Time. CONFIDENTIALTY NOTICE: This fax transmission is intended only for the addressee. It contains information that is legally privileged, confidential or otherwise protected from use or disclosure. If you are not the intended recipient, you are strictly prohibited from reviewing, disclosing, copying using or disseminating any of this information or taking any action in reliance on or regarding this information. If you have received this fax in error, please notify us immediately by telephone so that we can arrange for its return to [place]. Phone: [phone], Toll-Free: [phone], Fax: [phone] Page: 1 of 2 Call Id: [id number] [place] Patient Name: Courtney Thomas Gender: Female DOB: Aug 05, 1945 Age: 70 Y 40 M 17 D Return Phone Number: [phone] (Primary) Address: City/State/Zip: [place] Client [place] Night - Client Client Site [place] Physician Ria Bush - MD Contact Type Call Who Is Calling Patient / Member / Family / Caregiver Call Type Triage / Clinical Relationship To Patient Self Return Phone Number [phone] (Primary) Chief Complaint Arm Pain (no known cause) Reason for Call Symptomatic / Request for Health Information Initial Comment caller states she is having left arm pain that radiates to the top of her left breast PreDisposition Did not know what to do Translation No Nurse Assessment Nurse: Courtney Guard, RN, Lesa Date/Time (Eastern Time): 12/06/2015 9:49:50 AM Confirm and document reason for call. If symptomatic, describe symptoms. You must click the next button to save text entered. ---[name] states she has left arm pain Has the patient traveled out of the country within the last 30 days? ---No Does the patient have any  new or worsening symptoms? ---Yes Will a triage be completed? ---Yes Related visit to physician within the last 2 weeks? ---No Does the PT have any chronic conditions? (i.e. diabetes, asthma, etc.) ---No Is this a behavioral health or substance abuse call? ---No Guidelines Guideline Title Affirmed Question Affirmed Notes Nurse Date/Time (Eastern Time) Arm Pain Difficulty breathing or unusual sweating (e.g., sweating without exertion) Conner, RN, Lesa 12/06/2015 9:50:41 AM Disp. Time Eilene Ghazi Time) Disposition Final User 12/06/2015 9:52:12 AM Go to ED Now Yes Conner, RN, Emmaline Kluver [name] Understands: Yes PLEASE NOTE: All timestamps contained within this report are represented as [place] Standard Time. CONFIDENTIALTY NOTICE: This fax transmission is intended only for the addressee. It contains information that is legally privileged, confidential or otherwise protected from use or disclosure. If you are not the intended recipient, you are strictly prohibited from reviewing, disclosing, copying using or disseminating any of this information or taking any action in reliance on or regarding this information. If you have received this fax in error, please notify us immediately by telephone so that we can arrange for its return to [place]. Phone: [phone], Toll-Free: [phone], Fax: [phone] Page: 2 of 2 Call Id: [id number] Disagree/Comply: Comply Care Advice Given Per Guideline GO TO ED NOW: You need to be seen in the Emergency Department. Go to the ER at ___________ [place] now. Drive carefully. NOTE TO TRIAGER - DRIVING: * Another adult should drive. BRING MEDICINES: * It is also a good idea to bring the pill bottles too. This will help the doctor to make certain you are taking the right medicines and the right dose. CARE ADVICE given per Arm Pain (Adult) guideline. Referrals [facility] - ED

## 2015-12-08 ENCOUNTER — Encounter: Payer: Self-pay | Admitting: Family Medicine

## 2015-12-08 ENCOUNTER — Encounter: Payer: Self-pay | Admitting: *Deleted

## 2015-12-08 ENCOUNTER — Ambulatory Visit (INDEPENDENT_AMBULATORY_CARE_PROVIDER_SITE_OTHER): Payer: Commercial Managed Care - HMO | Admitting: Family Medicine

## 2015-12-08 VITALS — BP 110/60 | HR 88 | Temp 98.4°F | Wt 122.0 lb

## 2015-12-08 DIAGNOSIS — R251 Tremor, unspecified: Secondary | ICD-10-CM

## 2015-12-08 DIAGNOSIS — S29011D Strain of muscle and tendon of front wall of thorax, subsequent encounter: Secondary | ICD-10-CM

## 2015-12-08 DIAGNOSIS — S29011A Strain of muscle and tendon of front wall of thorax, initial encounter: Secondary | ICD-10-CM | POA: Insufficient documentation

## 2015-12-08 DIAGNOSIS — J209 Acute bronchitis, unspecified: Secondary | ICD-10-CM | POA: Diagnosis not present

## 2015-12-08 MED ORDER — AZITHROMYCIN 250 MG PO TABS: ORAL_TABLET | ORAL | Status: DC

## 2015-12-08 MED ORDER — HYDROCODONE-HOMATROPINE 5-1.5 MG/5ML PO SYRP: 5.0000 mL | ORAL_SOLUTION | Freq: Two times a day (BID) | ORAL | Status: DC | PRN

## 2015-12-08 NOTE — Assessment & Plan Note (Signed)
Anticipate essential tremor. Reassured against PD.

## 2015-12-08 NOTE — Progress Notes (Signed)
Pre visit review using our clinic review tool, if applicable. No additional management support is needed unless otherwise documented below in the visit note. 

## 2015-12-08 NOTE — Progress Notes (Signed)
BP 110/60 mmHg  Pulse 88  Temp(Src) 98.4 F (36.9 C) (Oral)  Wt 122 lb (55.339 kg)  SpO2 97%   CC: hosp f/u visit  Subjective:    Patient ID: [name], female    DOB: November 21, 1945, 70 y.o.   MRN: [id number]  HPI: BARAAH VANDELINDER is a 70 y.o. female presenting on 12/08/2015 for Follow-up and URI   Seen at ER 5/7 with acute chest wall pain. Records reviewed. CXR, troponins, CXR normal. Positional pain worse with deep breaths and coughing. Reproducible to palpation. Denies new rash. Chest pain started after cough. Treating with ibuprofen with this.   Ongoing cough over last 5 days. Dry cough. Fever to 102 last night. + chills, coughing fits, HA, PNdrainage. + dyspnea and wheezing.  Denies congestion, ST, PNdrainage, ear or tootoh pain.  Has treated cough with old cough syrup which has helped.  No sick contacts at home.  Did not receive flu shot this year.   Concern over increasing postural tremors (noticed more when eating). No rigidity, confusion. Increasing orthostatic unsteadiness. No imbalance with gait. No anosmia. No masked fascies.   Relevant past medical, surgical, family and social history reviewed and updated as indicated. Interim medical history since our last visit reviewed. Allergies and medications reviewed and updated. Current Outpatient Prescriptions on File Prior to Visit  Medication Sig  . alendronate (FOSAMAX) 70 MG tablet Take 1 tablet (70 mg total) by mouth every 7 (seven) days. Take with a full glass of water on an empty stomach.  Marland Kitchen atorvastatin (LIPITOR) 20 MG tablet Take 1 tablet (20 mg total) by mouth daily.  Marland Kitchen buPROPion (WELLBUTRIN SR) 150 MG 12 hr tablet Take 1 tablet (150 mg total) by mouth daily.  . cetirizine (ZYRTEC) 10 MG tablet Take 1 tablet (10 mg total) by mouth daily.  . Cholecalciferol (VITAMIN D) 2000 UNITS CAPS Take 1 capsule by mouth daily.  . DULoxetine (CYMBALTA) 60 MG capsule Take 1 capsule (60 mg total) by mouth daily.  Marland Kitchen ibuprofen  (ADVIL,MOTRIN) 400 MG tablet Take 1.5 tablets (600 mg total) by mouth every 6 (six) hours as needed for moderate pain. (Patient taking differently: Take 200 mg by mouth every 6 (six) hours as needed for moderate pain. )  . lisinopril-hydrochlorothiazide (PRINZIDE,ZESTORETIC) 20-12.5 MG tablet TAKE ONE (1) TABLET EACH DAY  . naproxen sodium (ANAPROX) 220 MG tablet Take 220 mg by mouth every 4 (four) hours as needed. For arthritis.  Marland Kitchen traMADol (ULTRAM) 50 MG tablet Take 0.5-1 tablets (25-50 mg total) by mouth 2 (two) times daily as needed for moderate pain.  . Vitamin D, Ergocalciferol, (DRISDOL) 50000 UNITS CAPS capsule Take 1 capsule (50,000 Units total) by mouth every 7 (seven) days.  Marland Kitchen zolpidem (AMBIEN) 10 MG tablet TAKE ONE TABLET AT BEDTIME AS NEEDED   No current facility-administered medications on file prior to visit.    Review of Systems Per HPI unless specifically indicated in ROS section     Objective:    BP 110/60 mmHg  Pulse 88  Temp(Src) 98.4 F (36.9 C) (Oral)  Wt 122 lb (55.339 kg)  SpO2 97%  Wt Readings from Last 3 Encounters:  12/08/15 122 lb (55.339 kg)  12/06/15 122 lb (55.339 kg)  08/06/15 122 lb 12 oz (55.679 kg)    Physical Exam  Constitutional: She appears well-developed and well-nourished. No distress.  HENT:  Head: Normocephalic and atraumatic.  Right Ear: Hearing, tympanic membrane, external ear and ear canal normal.  Left Ear:  Hearing, tympanic membrane, external ear and ear canal normal.  Nose: No mucosal edema or rhinorrhea. Right sinus exhibits no maxillary sinus tenderness and no frontal sinus tenderness. Left sinus exhibits no maxillary sinus tenderness and no frontal sinus tenderness.  Mouth/Throat: Uvula is midline, oropharynx is clear and moist and mucous membranes are normal. No oropharyngeal exudate, posterior oropharyngeal edema, posterior oropharyngeal erythema or tonsillar abscesses.  Eyes: Conjunctivae and EOM are normal. Pupils are equal,  round, and reactive to light. No scleral icterus.  Neck: Normal range of motion. Neck supple.  Cardiovascular: Normal rate, regular rhythm, normal heart sounds and intact distal pulses.   No murmur heard. Pulmonary/Chest: Effort normal and breath sounds normal. No respiratory distress. She has no wheezes. She has no rales. She exhibits tenderness (tender to palpation predominantly R anterior chest, some at axilla and posterior chest wall).  Crackles bibasilarly  Musculoskeletal: She exhibits no edema.  Lymphadenopathy:    She has no cervical adenopathy.  Skin: Skin is warm and dry. No rash noted.  No skin rash  Psychiatric: She has a normal mood and affect.  Nursing note and vitals reviewed.  Results for orders placed or performed during the hospital encounter of [id number]  Basic metabolic panel  Result Value Ref Range   Sodium 138 135 - 145 mmol/L   Potassium 3.6 3.5 - 5.1 mmol/L   Chloride 103 101 - 111 mmol/L   CO2 22 22 - 32 mmol/L   Glucose, Bld 93 65 - 99 mg/dL   BUN 16 6 - 20 mg/dL   Creatinine, Ser 0.81 0.44 - 1.00 mg/dL   Calcium 9.4 8.9 - 10.3 mg/dL   GFR calc non Af Amer >60 >60 mL/min   GFR calc Af Amer >60 >60 mL/min   Anion gap 13 5 - 15  CBC  Result Value Ref Range   WBC 5.3 3.6 - 11.0 K/uL   RBC 4.38 3.80 - 5.20 MIL/uL   Hemoglobin 13.0 12.0 - 16.0 g/dL   HCT 38.9 35.0 - 47.0 %   MCV 88.9 80.0 - 100.0 fL   MCH 29.7 26.0 - 34.0 pg   MCHC 33.4 32.0 - 36.0 g/dL   RDW 13.6 11.5 - 14.5 %   Platelets 237 150 - 440 K/uL  Troponin I  Result Value Ref Range   Troponin I <0.03 <0.031 ng/mL   CHEST 2 VIEW COMPARISON: September 21, 2006 FINDINGS: There is mild left base atelectasis. Lungs elsewhere clear. Heart size and pulmonary vascularity are normal. No adenopathy. There is atherosclerotic calcification in the aorta. No bone lesions. IMPRESSION: Left base atelectasis. Lungs elsewhere clear. Cardiac silhouette within normal limits. Electronically Signed   By: Lowella Grip III M.D.  On: 12/06/2015 11:56    Assessment & Plan:   Problem List Items Addressed This Visit    Acute bronchitis - Primary    With spiking fever last night, will treat aggressively for bacterial bronchitis with zpack. Hycodan for cough, start aleve 220mg  bid with meals.  Red flags to seek further care discussed. Update if not improving with treatment.      Chest wall muscle strain    New - started after coughing fits with current bronchitis. Reviewed ER workup. Treat with aleve 220mg  bid. Update if no better, consider steroid course.       Tremor    Anticipate essential tremor. Reassured against PD.           Follow up plan: Return if symptoms worsen or fail to improve.  Ria Bush, MD

## 2015-12-08 NOTE — Assessment & Plan Note (Signed)
With spiking fever last night, will treat aggressively for bacterial bronchitis with zpack. Hycodan for cough, start aleve 220mg  bid with meals.  Red flags to seek further care discussed. Update if not improving with treatment.

## 2015-12-08 NOTE — Patient Instructions (Addendum)
You have bronchitis and rib cage strain.  Stop ibuprofen. Start aleve twice daily with meals. Hycodan cough syrup to suppress cough, ok to do twice daily. zpack antibiotic to treat bronchitis. Update [place] if no better with treatment.  Out of work from Monday through Thursday

## 2015-12-08 NOTE — Assessment & Plan Note (Signed)
New - started after coughing fits with current bronchitis. Reviewed ER workup. Treat with aleve 220mg  bid. Update if no better, consider steroid course.

## 2015-12-11 ENCOUNTER — Encounter: Payer: Self-pay | Admitting: *Deleted

## 2015-12-11 ENCOUNTER — Telehealth: Payer: Self-pay

## 2015-12-11 NOTE — Telephone Encounter (Signed)
Work note written and placed up front for pick up. Patient notified.

## 2015-12-11 NOTE — Telephone Encounter (Signed)
Pt left /vm; pt seen on 12/08/15 and given note to be out of work Mon - Thursday. Pt did not feel like working today and request a note to be out of work on 12/11/15. Pt request cb.

## 2015-12-11 NOTE — Telephone Encounter (Signed)
Pt called checking on note [phone]

## 2015-12-11 NOTE — Telephone Encounter (Signed)
Ok to do note for work for today. Thanks

## 2015-12-14 ENCOUNTER — Telehealth: Payer: Self-pay

## 2015-12-14 DIAGNOSIS — R062 Wheezing: Secondary | ICD-10-CM | POA: Diagnosis not present

## 2015-12-14 DIAGNOSIS — R05 Cough: Secondary | ICD-10-CM | POA: Diagnosis not present

## 2015-12-14 DIAGNOSIS — J4541 Moderate persistent asthma with (acute) exacerbation: Secondary | ICD-10-CM | POA: Diagnosis not present

## 2015-12-14 MED ORDER — PREDNISONE 20 MG PO TABS: ORAL_TABLET | ORAL | Status: DC

## 2015-12-14 NOTE — Telephone Encounter (Signed)
Stop aleve. Start prednisone course sent to pharmacy. If worsening shortness of breath, worsening productive cough or fever, rec schedule f/u appt in office for recheck.

## 2015-12-14 NOTE — Telephone Encounter (Signed)
Message left advising patient.  

## 2015-12-14 NOTE — Telephone Encounter (Signed)
Pt left v/m; pt seen 12/08/15; pt is not feeling any better; med has not helped;after pt begins to cough pts breathing gets labored. Pt request cb. [place].

## 2015-12-17 ENCOUNTER — Other Ambulatory Visit: Payer: Self-pay | Admitting: *Deleted

## 2015-12-25 ENCOUNTER — Telehealth: Payer: Self-pay | Admitting: Family Medicine

## 2015-12-25 NOTE — Telephone Encounter (Signed)
Patient Name: Courtney Thomas DOB: 1946/02/05 Initial Comment [name] states she has thrush Nurse Assessment Nurse: Vallery Sa, RN, [place] Date/Time (Eastern Time): 12/25/2015 9:55:23 AM Confirm and document reason for call. If symptomatic, describe symptoms. You must click the next button to save text entered. ---Roiza states she developed Thrush again in her mouth about 2 days ago. No fever. No severe breathing or swallowing difficulty. Alert and responsive. Has the patient traveled out of the country within the last 30 days? ---No Does the patient have any new or worsening symptoms? ---Yes Will a triage be completed? ---Yes Related visit to physician within the last 2 weeks? ---No Does the PT have any chronic conditions? (i.e. diabetes, asthma, etc.) ---Yes List chronic conditions. ---Recent Bronchitis (completed antibiotics about 8 days ago), Asthma Is this a behavioral health or substance abuse call? ---No Guidelines Guideline Title Affirmed Question Affirmed Notes Mouth Symptoms White patches that stick to tongue or inner cheek Final Disposition User See PCP When Office is Open (within 3 days) Vallery Sa, RN, [employer] plans to go to urgent care or will call the Saturday [facility]. Referrals [facility] Disagree/Comply: Comply

## 2015-12-29 ENCOUNTER — Other Ambulatory Visit: Payer: Self-pay | Admitting: Family Medicine

## 2015-12-29 NOTE — Telephone Encounter (Signed)
plz ensure evaluated for this. If not, I will send nystatin to pharmacy.

## 2015-12-29 NOTE — Telephone Encounter (Signed)
Left message on answering machine at home number to call back. 

## 2015-12-29 NOTE — Telephone Encounter (Signed)
Last filled 12-02-15 #30 Last OV 12-08-15 No Future OV

## 2015-12-29 NOTE — Telephone Encounter (Signed)
Spoke with patient. She didn't go for eval, but did do a home remedy of Buttermilk swish, Peroxide swish and then Glyoxide swish and it took care of it. She has no more problems and is feeling much better now.

## 2015-12-30 NOTE — Telephone Encounter (Signed)
plz phone in. 

## 2015-12-30 NOTE — Telephone Encounter (Signed)
Rx called in as directed.   

## 2016-01-12 ENCOUNTER — Other Ambulatory Visit: Payer: Self-pay | Admitting: Family Medicine

## 2016-01-12 DIAGNOSIS — Z1231 Encounter for screening mammogram for malignant neoplasm of breast: Secondary | ICD-10-CM

## 2016-02-10 ENCOUNTER — Other Ambulatory Visit: Payer: Self-pay | Admitting: Family Medicine

## 2016-02-10 ENCOUNTER — Ambulatory Visit
Admission: RE | Admit: 2016-02-10 | Discharge: 2016-02-10 | Disposition: A | Discharge status: Home / Self Care | Payer: Commercial Managed Care - HMO | Source: Ambulatory Visit | Attending: Family Medicine | Admitting: Family Medicine

## 2016-02-10 DIAGNOSIS — Z1231 Encounter for screening mammogram for malignant neoplasm of breast: Secondary | ICD-10-CM

## 2016-03-24 ENCOUNTER — Other Ambulatory Visit: Payer: Self-pay | Admitting: Family Medicine

## 2016-03-24 DIAGNOSIS — D519 Vitamin B12 deficiency anemia, unspecified: Secondary | ICD-10-CM

## 2016-03-24 DIAGNOSIS — Z1159 Encounter for screening for other viral diseases: Secondary | ICD-10-CM

## 2016-03-24 DIAGNOSIS — E785 Hyperlipidemia, unspecified: Secondary | ICD-10-CM

## 2016-03-24 DIAGNOSIS — I10 Essential (primary) hypertension: Secondary | ICD-10-CM

## 2016-03-24 DIAGNOSIS — E559 Vitamin D deficiency, unspecified: Secondary | ICD-10-CM

## 2016-03-25 ENCOUNTER — Encounter: Payer: Self-pay | Admitting: Family Medicine

## 2016-03-25 ENCOUNTER — Other Ambulatory Visit (INDEPENDENT_AMBULATORY_CARE_PROVIDER_SITE_OTHER): Payer: Commercial Managed Care - HMO

## 2016-03-25 ENCOUNTER — Ambulatory Visit (INDEPENDENT_AMBULATORY_CARE_PROVIDER_SITE_OTHER): Payer: Commercial Managed Care - HMO

## 2016-03-25 VITALS — BP 118/64 | HR 68 | Temp 98.0°F | Ht 58.5 in | Wt 118.5 lb

## 2016-03-25 DIAGNOSIS — E559 Vitamin D deficiency, unspecified: Secondary | ICD-10-CM | POA: Diagnosis not present

## 2016-03-25 DIAGNOSIS — D519 Vitamin B12 deficiency anemia, unspecified: Secondary | ICD-10-CM | POA: Diagnosis not present

## 2016-03-25 DIAGNOSIS — Z0000 Encounter for general adult medical examination without abnormal findings: Secondary | ICD-10-CM | POA: Diagnosis not present

## 2016-03-25 DIAGNOSIS — E785 Hyperlipidemia, unspecified: Secondary | ICD-10-CM | POA: Diagnosis not present

## 2016-03-25 DIAGNOSIS — Z1159 Encounter for screening for other viral diseases: Secondary | ICD-10-CM | POA: Diagnosis not present

## 2016-03-25 DIAGNOSIS — I10 Essential (primary) hypertension: Secondary | ICD-10-CM

## 2016-03-25 LAB — BASIC METABOLIC PANEL
BUN: 14 mg/dL (ref 6–23)
CALCIUM: 9 mg/dL (ref 8.4–10.5)
CO2: 30 meq/L (ref 19–32)
CREATININE: 0.92 mg/dL (ref 0.40–1.20)
Chloride: 102 mEq/L (ref 96–112)
GFR: 64.41 mL/min (ref >60.00)
GLUCOSE: 89 mg/dL (ref 70–99)
Potassium: 4.1 mEq/L (ref 3.5–5.1)
Sodium: 138 mEq/L (ref 135–145)

## 2016-03-25 LAB — LIPID PANEL
CHOLESTEROL: 149 mg/dL (ref 0–200)
HDL: 75.4 mg/dL (ref >39.00)
LDL CALC: 63 mg/dL (ref 0–99)
NonHDL: 73.1
TRIGLYCERIDES: 49 mg/dL (ref 0.0–149.0)
Total CHOL/HDL Ratio: 2
VLDL: 9.8 mg/dL (ref 0.0–40.0)

## 2016-03-25 LAB — VITAMIN B12: Vitamin B-12: 274 pg/mL (ref 211–911)

## 2016-03-25 LAB — VITAMIN D 25 HYDROXY (VIT D DEFICIENCY, FRACTURES): VITD: 24.55 ng/mL — ABNORMAL LOW (ref 30.00–100.00)

## 2016-03-25 NOTE — Progress Notes (Signed)
Pre visit review using our clinic review tool, if applicable. No additional management support is needed unless otherwise documented below in the visit note. 

## 2016-03-25 NOTE — Progress Notes (Signed)
Subjective:   Courtney Thomas is a 70 y.o. female who presents for Medicare Annual (Subsequent) preventive examination.  Review of Systems:  N/A Cardiac Risk Factors include: advanced age (>30mn, >>60women);dyslipidemia;hypertension     Objective:     Vitals: BP 118/64 (BP Location: Left Arm, Patient Position: Sitting, Cuff Size: Normal)   Pulse 68   Temp 98 F (36.7 C) (Oral)   Ht 4' 10.5" (1.486 m) Comment: no shoes  Wt 118 lb 8 oz (53.8 kg)   SpO2 95%   BMI 24.34 kg/m   Body mass index is 24.34 kg/m.   Tobacco History  Smoking Status  . Former Smoker  Smokeless Tobacco  . Never Used    Comment: minimal smoking as a teenager     Counseling given: No   Past Medical History:  Diagnosis Date  . Adjustment disorder with depressed mood   . Asthma   . B12 deficiency anemia 2014   mild  . Frequent headaches   . History of chicken pox   . Hyperlipidemia   . Hypertension   . Insomnia   . Osteoporosis    latest dexa 03/2014 - prior on fosamax for years  . Seasonal allergic rhinitis    Past Surgical History:  Procedure Laterality Date  . ABDOMINAL HYSTERECTOMY  1994  . BREAST CYST ASPIRATION    . BREAST EXCISIONAL BIOPSY Right 1971   neg  . COLONOSCOPY  07/2013   WNL per patient ([place])  . dexa  03/2014   T score spine -3.5, hip -2.0  . NASAL SINUS SURGERY     deviated septum  . TONSILLECTOMY  1970   Family History  Problem Relation Age of Onset  . Cancer Mother 637   lung met to brain, smoker  . Stroke Mother   . Alzheimer's disease Mother   . Arthritis Father   . Arthritis Sister   . Cancer Brother 767   pancreatic  . Diabetes Maternal Grandmother    History  Sexual Activity  . Sexual activity: No    Outpatient Encounter Prescriptions as of 03/25/2016  Medication Sig  . alendronate (FOSAMAX) 70 MG tablet Take 1 tablet (70 mg total) by mouth every 7 (seven) days. Take with a full glass of water on an empty stomach.  .Marland Kitchenatorvastatin (LIPITOR) 20  MG tablet Take 1 tablet (20 mg total) by mouth daily.  .Marland KitchenbuPROPion (WELLBUTRIN SR) 150 MG 12 hr tablet Take 1 tablet (150 mg total) by mouth daily.  . cetirizine (ZYRTEC) 10 MG tablet Take 1 tablet (10 mg total) by mouth daily.  . Cholecalciferol (VITAMIN D) 2000 UNITS CAPS Take 1 capsule by mouth daily.  . DULoxetine (CYMBALTA) 60 MG capsule Take 1 capsule (60 mg total) by mouth daily.  .Marland Kitchenibuprofen (ADVIL,MOTRIN) 400 MG tablet Take 1.5 tablets (600 mg total) by mouth every 6 (six) hours as needed for moderate pain. (Patient taking differently: Take 200 mg by mouth every 6 (six) hours as needed for moderate pain. )  . lisinopril-hydrochlorothiazide (PRINZIDE,ZESTORETIC) 20-12.5 MG tablet TAKE ONE (1) TABLET EACH DAY  . naproxen sodium (ANAPROX) 220 MG tablet Take 220 mg by mouth every 4 (four) hours as needed. For arthritis.  .Marland KitchentraMADol (ULTRAM) 50 MG tablet Take 0.5-1 tablets (25-50 mg total) by mouth 2 (two) times daily as needed for moderate pain.  . Vitamin D, Ergocalciferol, (DRISDOL) 50000 UNITS CAPS capsule Take 1 capsule (50,000 Units total) by mouth every 7 (seven) days.  .Marland Kitchen  zolpidem (AMBIEN) 10 MG tablet TAKE ONE (1) TABLET AT BEDTIME  . [DISCONTINUED] azithromycin (ZITHROMAX) 250 MG tablet Take two tablets on day one followed by one tablet on days 2-5  . [DISCONTINUED] HYDROcodone-homatropine (HYCODAN) 5-1.5 MG/5ML syrup Take 5 mLs by mouth 2 (two) times daily as needed for cough (sedation precautions).  . [DISCONTINUED] predniSONE (DELTASONE) 20 MG tablet Take two tablets daily for 3 days followed by one tablet daily for 3days   No facility-administered encounter medications on file as of 03/25/2016.     Activities of Daily Living In your present state of health, do you have any difficulty performing the following activities: 03/25/2016  Hearing? N  Vision? N  Difficulty concentrating or making decisions? N  Walking or climbing stairs? N  Dressing or bathing? N  Doing errands,  shopping? N  Preparing Food and eating ? N  Using the Toilet? N  In the past six months, have you accidently leaked urine? N  Do you have problems with loss of bowel control? N  Managing your Medications? N  Managing your Finances? N  Housekeeping or managing your Housekeeping? N  Some recent data might be hidden    Patient Care Team: Ria Bush, MD as PCP - General (Family Medicine) Crista Curb. Gloriann Loan, MD as Consulting Physician (Ophthalmology) Mellissa Kohut, DDS, PA as Consulting Physician (Dentistry)    Assessment:     Hearing Screening   _0  _1  _2  _3  _4  _5  _6  _7  _8   Right ear:   40 40 40  40    Left ear:   40 40 0  0    Vision Screening Comments: Last vision exam in March 2017 with Dr. Gloriann Loan   Exercise Activities and Dietary recommendations Current Exercise Habits: Home exercise routine, Type of exercise: walking, Time (Minutes): 30, Frequency (Times/Week): 7, Weekly Exercise (Minutes/Week): 210, Intensity: Moderate, Exercise limited by: None identified  Goals    . Increase physical activity          Starting 03/25/2016, I will continue to walk at least 30 min daily.       Fall Risk Fall Risk  03/25/2016 03/17/2015 02/21/2014  Falls in the past year? No No Yes  Number falls in past yr: - - 1  Injury with Fall? - - Yes   Depression Screen PHQ 2/9 Scores 03/25/2016 03/17/2015 02/21/2014  PHQ - 2 Score 1 0 4  PHQ- 9 Score - - 7     Cognitive Testing MMSE - Mini Mental State Exam 03/25/2016  Orientation to time 5  Orientation to Place 5  Registration 3  Attention/ Calculation 0  Recall 3  Language- name 2 objects 0  Language- repeat 1  Language- follow 3 step command 3  Language- read & follow direction 0  Write a sentence 0  Copy design 0  Total score 20   PLEASE NOTE: A Mini-Cog screen was completed. Maximum score is 20. A value of 0 denotes this part of Folstein MMSE was not completed or the patient failed this part of the  Mini-Cog screening.   Mini-Cog Screening Orientation to Time - Max 5 pts Orientation to Place - Max 5 pts Registration - Max 3 pts Recall - Max 3 pts Language Repeat - Max 1 pts Language Follow 3 Step Command - Max 3 pts   Immunization History  Administered Date(s) Administered  . Pneumococcal Conjugate-13 08/27/2015   Screening Tests Health Maintenance  Topic Date Due  . INFLUENZA VACCINE  07/31/2016 (Originally 03/01/2016)  .  DTaP/Tdap/Td (1 - Tdap) 03/25/2024 (Originally 08/20/1966)  . ZOSTAVAX  03/25/2024 (Originally 08/21/2007)  . PNA vac Low Risk Adult (2 of 2 - PPSV23) 08/26/2016  . MAMMOGRAM  02/09/2018  . TETANUS/TDAP  08/01/2020  . COLONOSCOPY  08/02/2023  . DEXA SCAN  Completed  . Hepatitis C Screening  Completed      Plan:     I have personally reviewed and addressed the Medicare Annual Wellness questionnaire and have noted the following in the patient's chart:  A. Medical and social history B. Use of alcohol, tobacco or illicit drugs  C. Current medications and supplements D. Functional ability and status E.  Nutritional status F.  Physical activity G. Advance directives H. List of other physicians I.  Hospitalizations, surgeries, and ER visits in previous 12 months J.  [place] to include hearing, vision, cognitive, depression L. Referrals and appointments - none  In addition, I have reviewed and discussed with patient certain preventive protocols, quality metrics, and best practice recommendations. A written personalized care plan for preventive services as well as general preventive health recommendations were provided to patient.  See attached scanned questionnaire for additional information.   Signed,   Lindell Noe, MHA, BS, LPN Health Advisor

## 2016-03-25 NOTE — Progress Notes (Signed)
PCP notes:   Health maintenance:  Flu vaccine - addressed (will see about ordering an egg-free version) Shingles - postponed/egg allergy Hep C screening - completed  Abnormal screenings:   Hearing - failed Depression score: 1  Patient concerns:   Pt is still experiencing an emotional transition related to death of spouse.   Nurse concerns:  None  Next PCP appt:   04/01/16 @ 1130

## 2016-03-25 NOTE — Patient Instructions (Signed)
Courtney Thomas , Thank you for taking time to come for your Medicare Wellness Visit. I appreciate your ongoing commitment to your health goals. Please review the following plan we discussed and let me know if I can assist you in the future.   These are the goals we discussed: Goals    . Increase physical activity          Starting 03/25/2016, I will continue to walk at least 30 min daily.        This is a list of the screening recommended for you and due dates:  Health Maintenance  Topic Date Due  . Flu Shot  07/31/2016*  . DTaP/Tdap/Td vaccine (1 - Tdap) 03/25/2024*  . Shingles Vaccine  03/25/2024*  . Pneumonia vaccines (2 of 2 - PPSV23) 08/26/2016  . Mammogram  02/09/2018  . Tetanus Vaccine  08/01/2020  . Colon Cancer Screening  08/02/2023  . DEXA scan (bone density measurement)  Completed  .  Hepatitis C: One time screening is recommended by Center for Disease Control  (CDC) for  adults born from [birth year] through 1963.   Completed  *Topic was postponed. The date shown is not the original due date.   Preventive Care for Adults  A healthy lifestyle and preventive care can promote health and wellness. Preventive health guidelines for adults include the following key practices.  . A routine yearly physical is a good way to check with your health care provider about your health and preventive screening. It is a chance to share any concerns and updates on your health and to receive a thorough exam.  . Visit your dentist for a routine exam and preventive care every 6 months. Brush your teeth twice a day and floss once a day. Good oral hygiene prevents tooth decay and gum disease.  . The frequency of eye exams is based on your age, health, family medical history, use  of contact lenses, and other factors. Follow your health care provider's ecommendations for frequency of eye exams.  . Eat a healthy diet. Foods like vegetables, fruits, whole grains, low-fat dairy products, and lean protein foods  contain the nutrients you need without too many calories. Decrease your intake of foods high in solid fats, added sugars, and salt. Eat the right amount of calories for you. Get information about a proper diet from your health care provider, if necessary.  . Regular physical exercise is one of the most important things you can do for your health. Most adults should get at least 150 minutes of moderate-intensity exercise (any activity that increases your heart rate and causes you to sweat) each week. In addition, most adults need muscle-strengthening exercises on 2 or more days a week.  Silver Sneakers may be a benefit available to you. To determine eligibility, you may visit the website: www.silversneakers.com or contact program at 310-592-2732 Mon-Fri between 8AM-8PM.   . Maintain a healthy weight. The body mass index (BMI) is a screening tool to identify possible weight problems. It provides an estimate of body fat based on height and weight. Your health care provider can find your BMI and can help you achieve or maintain a healthy weight.   For adults 20 years and older: ? A BMI below 18.5 is considered underweight. ? A BMI of 18.5 to 24.9 is normal. ? A BMI of 25 to 29.9 is considered overweight. ? A BMI of 30 and above is considered obese.   . Maintain normal blood lipids and cholesterol levels by exercising  and minimizing your intake of saturated fat. Eat a balanced diet with plenty of fruit and vegetables. Blood tests for lipids and cholesterol should begin at age 35 and be repeated every 5 years. If your lipid or cholesterol levels are high, you are over 50, or you are at high risk for heart disease, you may need your cholesterol levels checked more frequently. Ongoing high lipid and cholesterol levels should be treated with medicines if diet and exercise are not working.  . If you smoke, find out from your health care provider how to quit. If you do not use tobacco, please do not  start.  . If you choose to drink alcohol, please do not consume more than 2 drinks per day. One drink is considered to be 12 ounces (355 mL) of beer, 5 ounces (148 mL) of wine, or 1.5 ounces (44 mL) of liquor.  . If you are 29-37 years old, ask your health care provider if you should take aspirin to prevent strokes.  . Use sunscreen. Apply sunscreen liberally and repeatedly throughout the day. You should seek shade when your shadow is shorter than you. Protect yourself by wearing long sleeves, pants, a wide-brimmed hat, and sunglasses year round, whenever you are outdoors.  . Once a month, do a whole body skin exam, using a mirror to look at the skin on your back. Tell your health care provider of new moles, moles that have irregular borders, moles that are larger than a pencil eraser, or moles that have changed in shape or color.

## 2016-03-26 LAB — HEPATITIS C ANTIBODY: HCV Ab: NEGATIVE

## 2016-03-26 LAB — INTRINSIC FACTOR ANTIBODIES: INTRINSIC FACTOR: NEGATIVE

## 2016-03-27 NOTE — Progress Notes (Signed)
I reviewed health advisor's note, was available for consultation, and agree with documentation and plan.  

## 2016-04-01 ENCOUNTER — Encounter: Payer: Self-pay | Admitting: Family Medicine

## 2016-04-01 ENCOUNTER — Ambulatory Visit (INDEPENDENT_AMBULATORY_CARE_PROVIDER_SITE_OTHER): Payer: Commercial Managed Care - HMO | Admitting: Family Medicine

## 2016-04-01 ENCOUNTER — Other Ambulatory Visit: Payer: Self-pay | Admitting: Family Medicine

## 2016-04-01 VITALS — BP 102/60 | HR 80 | Temp 98.2°F | Ht 58.5 in | Wt 118.0 lb

## 2016-04-01 DIAGNOSIS — E559 Vitamin D deficiency, unspecified: Secondary | ICD-10-CM

## 2016-04-01 DIAGNOSIS — Z7189 Other specified counseling: Secondary | ICD-10-CM

## 2016-04-01 DIAGNOSIS — F4321 Adjustment disorder with depressed mood: Secondary | ICD-10-CM

## 2016-04-01 DIAGNOSIS — M810 Age-related osteoporosis without current pathological fracture: Secondary | ICD-10-CM

## 2016-04-01 DIAGNOSIS — E785 Hyperlipidemia, unspecified: Secondary | ICD-10-CM | POA: Diagnosis not present

## 2016-04-01 DIAGNOSIS — I10 Essential (primary) hypertension: Secondary | ICD-10-CM

## 2016-04-01 DIAGNOSIS — Z0000 Encounter for general adult medical examination without abnormal findings: Secondary | ICD-10-CM

## 2016-04-01 DIAGNOSIS — Z23 Encounter for immunization: Secondary | ICD-10-CM | POA: Diagnosis not present

## 2016-04-01 DIAGNOSIS — D519 Vitamin B12 deficiency anemia, unspecified: Secondary | ICD-10-CM

## 2016-04-01 DIAGNOSIS — Z91012 Allergy to eggs: Secondary | ICD-10-CM | POA: Insufficient documentation

## 2016-04-01 MED ORDER — LISINOPRIL-HYDROCHLOROTHIAZIDE 10-12.5 MG PO TABS: 1.0000 | ORAL_TABLET | Freq: Every day | ORAL | 3 refills | Status: DC

## 2016-04-01 MED ORDER — VITAMIN B-12 1000 MCG PO TABS: 1000.0000 ug | ORAL_TABLET | Freq: Every day | ORAL | Status: DC

## 2016-04-01 MED ORDER — DULOXETINE HCL 60 MG PO CPEP: 60.0000 mg | ORAL_CAPSULE | Freq: Every day | ORAL | 11 refills | Status: DC

## 2016-04-01 MED ORDER — ALENDRONATE SODIUM 70 MG PO TABS: 70.0000 mg | ORAL_TABLET | ORAL | 3 refills | Status: DC

## 2016-04-01 MED ORDER — ATORVASTATIN CALCIUM 10 MG PO TABS: 10.0000 mg | ORAL_TABLET | Freq: Every day | ORAL | 3 refills | Status: DC

## 2016-04-01 MED ORDER — BUPROPION HCL ER (SR) 100 MG PO TB12: 100.0000 mg | ORAL_TABLET | Freq: Every day | ORAL | 11 refills | Status: DC

## 2016-04-01 MED ORDER — CALCIUM CARBONATE-VITAMIN D 600-400 MG-UNIT PO CHEW: 1.0000 | CHEWABLE_TABLET | Freq: Every day | ORAL | Status: DC

## 2016-04-01 NOTE — Assessment & Plan Note (Signed)
Preventative protocols reviewed and updated unless pt declined. Discussed healthy diet and lifestyle.  

## 2016-04-01 NOTE — Assessment & Plan Note (Addendum)
b12 shot today, rec start b12 1000 mcg PO daily.  Lab Results  Component Value Date   WBC 5.3 12/06/2015   HGB 13.0 12/06/2015   HCT 38.9 12/06/2015   MCV 88.9 12/06/2015   PLT 237 12/06/2015

## 2016-04-01 NOTE — Progress Notes (Signed)
BP 102/60 (BP Location: Left Arm, Patient Position: Sitting, Cuff Size: Normal)   Pulse 80   Temp 98.2 F (36.8 C) (Oral)   Ht 4' 10.5" (1.486 m)   Wt 118 lb (53.5 kg)   SpO2 97%   BMI 24.24 kg/m    CC: CPE Subjective:    Patient ID: Courtney Thomas, female    DOB: 02-24-1946, 70 y.o.   MRN: [id number]  HPI: Courtney Thomas is a 70 y.o. female presenting on 04/01/2016 for Medicare Wellness (Part 2 Saw Katha Cabal last week)   [name] last week for medicare wellness visit. Note reviewed. Failed hearing screen.   Noticing some increased nervous energy - daughter just lost pregnancy, took pills as overdose and stayed in ICU for several days, about to be discharged. Pt planning on traveling to [place] over weekend to be with daughter and SIL. Requests decreased wellbutrin dose.  Preventative: Colonoscopy - 07/2013 WNL 10 yr f/u ([place]) Well woman - with GYN, states pap smear - 2013/11/08 WNL ([facility] in [place] - to retire)  [facility] 01/2016 WNL.  DEXA 03/2014 T -3.5 spine, -3.0 hip, on fosamax since prior to 03/2014 Egg free flu shot today prevnar 11-09-2015. Pneumovax not due yet Tdap 11/09/11 with bad reaction  zostavax - to consider Advanced directives: HCPOA is daughter [name]. To bring me copy of living will. Seat belt use discussed Sunscreen use discussed. No changing moles on skin. Non smoker Alcohol - none  Lives alone - husband passed away Nov 08, 2013. 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 Interim medical history since our last visit reviewed. Allergies and medications reviewed and updated. Current Outpatient Prescriptions on File Prior to Visit  Medication Sig  . cetirizine (ZYRTEC) 10 MG tablet Take 1 tablet (10 mg total) by mouth daily.  Marland Kitchen ibuprofen  (ADVIL,MOTRIN) 400 MG tablet Take 1.5 tablets (600 mg total) by mouth every 6 (six) hours as needed for moderate pain. (Patient taking differently: Take 200 mg by mouth every 6 (six) hours as needed for moderate pain. )  . naproxen sodium (ANAPROX) 220 MG tablet Take 220 mg by mouth every 4 (four) hours as needed. For arthritis.  . Vitamin D, Ergocalciferol, (DRISDOL) 50000 UNITS CAPS capsule Take 1 capsule (50,000 Units total) by mouth every 7 (seven) days.  Marland Kitchen zolpidem (AMBIEN) 10 MG tablet TAKE ONE (1) TABLET AT BEDTIME   No current facility-administered medications on file prior to visit.     Review of Systems  Constitutional: Negative for activity change, appetite change, chills, fatigue, fever and unexpected weight change.  HENT: Negative for hearing loss.   Eyes: Negative for visual disturbance.  Respiratory: Negative for cough, chest tightness, shortness of breath and wheezing.   Cardiovascular: Negative for chest pain, palpitations and leg swelling.  Gastrointestinal: Negative for abdominal distention, abdominal pain, blood in stool, constipation, diarrhea, nausea and vomiting.  Genitourinary: Negative for difficulty urinating and hematuria.  Musculoskeletal: Negative for arthralgias, myalgias and neck pain.  Skin: Negative for rash.  Neurological: Negative for dizziness, seizures, syncope and headaches.  Hematological: Negative for adenopathy. Does not bruise/bleed easily.  Psychiatric/Behavioral: Positive for dysphoric mood. The patient is nervous/anxious.    Per HPI unless specifically indicated in ROS section     Objective:    BP  102/60 (BP Location: Left Arm, Patient Position: Sitting, Cuff Size: Normal)   Pulse 80   Temp 98.2 F (36.8 C) (Oral)   Ht 4' 10.5" (1.486 m)   Wt 118 lb (53.5 kg)   SpO2 97%   BMI 24.24 kg/m   Wt Readings from Last 3 Encounters:  04/01/16 118 lb (53.5 kg)  03/25/16 118 lb 8 oz (53.8 kg)  12/08/15 122 lb (55.3 kg)    BP Readings from  Last 3 Encounters:  04/01/16 102/60  03/25/16 118/64  12/08/15 110/60    Physical Exam  Constitutional: She is oriented to person, place, and time. She appears well-developed and well-nourished. No distress.  HENT:  Head: Normocephalic and atraumatic.  Right Ear: Hearing, tympanic membrane, external ear and ear canal normal.  Left Ear: Hearing, tympanic membrane, external ear and ear canal normal.  Nose: Nose normal.  Mouth/Throat: Uvula is midline, oropharynx is clear and moist and mucous membranes are normal. No oropharyngeal exudate, posterior oropharyngeal edema or posterior oropharyngeal erythema.  Eyes: Conjunctivae and EOM are normal. Pupils are equal, round, and reactive to light. No scleral icterus.  Neck: Normal range of motion. Neck supple.  Cardiovascular: Normal rate, regular rhythm, normal heart sounds and intact distal pulses.   No murmur heard. Pulses:      Radial pulses are 2+ on the right side, and 2+ on the left side.  Pulmonary/Chest: Effort normal and breath sounds normal. No respiratory distress. She has no wheezes. She has no rales.  Abdominal: Soft. Bowel sounds are normal. She exhibits no distension and no mass. There is no tenderness. There is no rebound and no guarding.  Musculoskeletal: Normal range of motion. She exhibits no edema.  Lymphadenopathy:    She has no cervical adenopathy.  Neurological: She is alert and oriented to person, place, and time.  CN grossly intact, station and gait intact  Skin: Skin is warm and dry. No rash noted.  Psychiatric: She has a normal mood and affect. Her behavior is normal. Judgment and thought content normal.  Nursing note and vitals reviewed.  Results for orders placed or performed in visit on 03/25/16  Lipid panel  Result Value Ref Range   Cholesterol 149 0 - 200 mg/dL   Triglycerides 49.0 0.0 - 149.0 mg/dL   HDL 75.40 >39.00 mg/dL   VLDL 9.8 0.0 - 40.0 mg/dL   LDL Cholesterol 63 0 - 99 mg/dL   Total CHOL/HDL  Ratio 2    NonHDL [id number]   Basic metabolic panel  Result Value Ref Range   Sodium 138 135 - 145 mEq/L   Potassium 4.1 3.5 - 5.1 mEq/L   Chloride 102 96 - 112 mEq/L   CO2 30 19 - 32 mEq/L   Glucose, Bld 89 70 - 99 mg/dL   BUN 14 6 - 23 mg/dL   Creatinine, Ser 0.92 0.40 - 1.20 mg/dL   Calcium 9.0 8.4 - 10.5 mg/dL   GFR 64.41 >60.00 mL/min  Vitamin B12  Result Value Ref Range   Vitamin B-12 274 211 - 911 pg/mL  VITAMIN D 25 Hydroxy (Vit-D Deficiency, Fractures)  Result Value Ref Range   VITD 24.55 (L) 30.00 - 100.00 ng/mL  Hepatitis C antibody  Result Value Ref Range   HCV Ab NEGATIVE NEGATIVE  Intrinsic Factor Antibodies  Result Value Ref Range   Intrinsic Factor Negative Negative      Assessment & Plan:   Problem List Items Addressed This Visit    Adjustment disorder with  depressed mood    Stable on cymbalta and wellbutrin 150mg  SR daily however noticing increased nervous energy which she doesn't like - possibly from activating wellbutrin. Will decrease dose to 100mg  SR once daily. Pt agrees. Support provided for recent unfortunate family stressors.       Advanced care planning/counseling discussion    Advanced directives: Chauncey Reading is daughter [name]. To bring me copy of living will.      B12 deficiency anemia    b12 shot today, rec start b12 1000 mcg PO daily.  Lab Results  Component Value Date   WBC 5.3 12/06/2015   HGB 13.0 12/06/2015   HCT 38.9 12/06/2015   MCV 88.9 12/06/2015   PLT 237 12/06/2015         Relevant Medications   vitamin B-12 (CYANOCOBALAMIN) 1000 MCG tablet   Health care maintenance - Primary    Preventative protocols reviewed and updated unless pt declined. Discussed healthy diet and lifestyle.       History of allergy to eggs   Hyperlipidemia    Chronic, stable. Great control - will decrease lipitor to 10mg  daily. ASCVD risk = 5.4%.       Relevant Medications   lisinopril-hydrochlorothiazide (PRINZIDE,ZESTORETIC) 10-12.5 MG tablet    atorvastatin (LIPITOR) 10 MG tablet   Hypertension    Chronic, stable. Mildly low reading actually. Decrease lisinopril hctz to 10/12.5mg  dose.      Relevant Medications   lisinopril-hydrochlorothiazide (PRINZIDE,ZESTORETIC) 10-12.5 MG tablet   atorvastatin (LIPITOR) 10 MG tablet   Osteoporosis    Fosamax restarted 08/2015, on regimen since at least 03/2014. Discussed atypical fracture risk as well as should hold bisphosphonate if any planned dental work. Encouraged continued weight bearing walking, encouraged start cal/vit D 600/400 supplement once daily. Update DEXA today.       Relevant Medications   alendronate (FOSAMAX) 70 MG tablet   Calcium Carbonate-Vitamin D (CALCIUM 600/VITAMIN D) 600-400 MG-UNIT chew tablet   Other Relevant Orders   DG Bone Density   Vitamin D deficiency    Improving levels on 50,000 IU weekly (started 03/2015). Continue.        Other Visit Diagnoses    Need for influenza vaccination       Relevant Orders   Flu vaccine greater than or equal to 3yo preservative free IM       Follow up plan: Return in about 6 months (around 09/29/2016), or as needed, for follow up visit.  Ria Bush, MD

## 2016-04-01 NOTE — Assessment & Plan Note (Signed)
Chronic, stable. Mildly low reading actually. Decrease lisinopril hctz to 10/12.5mg  dose.

## 2016-04-01 NOTE — Progress Notes (Signed)
Pre visit review using our clinic review tool, if applicable. No additional management support is needed unless otherwise documented below in the visit note. 

## 2016-04-01 NOTE — Assessment & Plan Note (Addendum)
Fosamax restarted 08/2015, on regimen since at least 03/2014. Discussed atypical fracture risk as well as should hold bisphosphonate if any planned dental work. Encouraged continued weight bearing walking, encouraged start cal/vit D 600/400 supplement once daily. Update DEXA today.

## 2016-04-01 NOTE — Assessment & Plan Note (Signed)
Chronic, stable. Great control - will decrease lipitor to 10mg  daily. ASCVD risk = 5.4%.

## 2016-04-01 NOTE — Assessment & Plan Note (Addendum)
Advanced directives: HCPOA is daughter Courtney Thomas. To bring me copy of living will.  

## 2016-04-01 NOTE — Patient Instructions (Addendum)
b12 shot today Egg free flu shot today Continue 50,000 units vitamin D weekly. Continue b12 1000 mcg daily.  Add calcium/vitamin D 64m/400 units one supplement daily. Try to get daily yogurt as well.  Decrease wellbutrin dose to 1080mSR daily.  Decrease blood pressure medicine dose. Decrease lipitor dose to 1031maily. We will order repeat bone density scan.  Pneumovax next year, consider shingles shot - if interested, call your insurance about the shingles shot to see if it is covered or how much it would cost and where is cheaper (here or pharmacy).  If you want to receive here, call for nurse visit.  Continue walking regularly.    Health Maintenance, Female Adopting a healthy lifestyle and getting preventive care can go a long way to promote health and wellness. Talk with your health care provider about what schedule of regular examinations is right for you. This is a good chance for you to check in with your provider about disease prevention and staying healthy. In between checkups, there are plenty of things you can do on your own. Experts have done a lot of research about which lifestyle changes and preventive measures are most likely to keep you healthy. Ask your health care provider for more information. WEIGHT AND DIET  Eat a healthy diet  Be sure to include plenty of vegetables, fruits, low-fat dairy products, and lean protein.  Do not eat a lot of foods high in solid fats, added sugars, or salt.  Get regular exercise. This is one of the most important things you can do for your health.  Most adults should exercise for at least 150 minutes each week. The exercise should increase your heart rate and make you sweat (moderate-intensity exercise).  Most adults should also do strengthening exercises at least twice a week. This is in addition to the moderate-intensity exercise.  Maintain a healthy weight  Body mass index (BMI) is a measurement that can be used to identify  possible weight problems. It estimates body fat based on height and weight. Your health care provider can help determine your BMI and help you achieve or maintain a healthy weight.  For females 20 59ars of age and older:   A BMI below 18.5 is considered underweight.  A BMI of 18.5 to 24.9 is normal.  A BMI of 25 to 29.9 is considered overweight.  A BMI of 30 and above is considered obese.  Watch levels of cholesterol and blood lipids  You should start having your blood tested for lipids and cholesterol at 20 47ars of age, then have this test every 5 years.  You may need to have your cholesterol levels checked more often if:  Your lipid or cholesterol levels are high.  You are older than 50 52ars of age.  You are at high risk for heart disease.  CANCER SCREENING   Lung Cancer  Lung cancer screening is recommended for adults 55-40 46ars old who are at high risk for lung cancer because of a history of smoking.  A yearly low-dose CT scan of the lungs is recommended for people who:  Currently smoke.  Have quit within the past 15 years.  Have at least a 30-pack-year history of smoking. A pack year is smoking an average of one pack of cigarettes a day for 1 year.  Yearly screening should continue until it has been 15 years since you quit.  Yearly screening should stop if you develop a health problem that would prevent you from having  lung cancer treatment.  Breast Cancer  Practice breast self-awareness. This means understanding how your breasts normally appear and feel.  It also means doing regular breast self-exams. Let your health care provider know about any changes, no matter how small.  If you are in your 20s or 30s, you should have a clinical breast exam (CBE) by a health care provider every 1-3 years as part of a regular health exam.  If you are 47 or older, have a CBE every year. Also consider having a breast X-ray (mammogram) every year.  If you have a family  history of breast cancer, talk to your health care provider about genetic screening.  If you are at high risk for breast cancer, talk to your health care provider about having an MRI and a mammogram every year.  Breast cancer gene (BRCA) assessment is recommended for women who have family members with BRCA-related cancers. BRCA-related cancers include:  Breast.  Ovarian.  Tubal.  Peritoneal cancers.  Results of the assessment will determine the need for genetic counseling and BRCA1 and BRCA2 testing. Cervical Cancer Your health care provider may recommend that you be screened regularly for cancer of the pelvic organs (ovaries, uterus, and vagina). This screening involves a pelvic examination, including checking for microscopic changes to the surface of your cervix (Pap test). You may be encouraged to have this screening done every 3 years, beginning at age 19.  For women ages 51-65, health care providers may recommend pelvic exams and Pap testing every 3 years, or they may recommend the Pap and pelvic exam, combined with testing for human papilloma virus (HPV), every 5 years. Some types of HPV increase your risk of cervical cancer. Testing for HPV may also be done on women of any age with unclear Pap test results.  Other health care providers may not recommend any screening for nonpregnant women who are considered low risk for pelvic cancer and who do not have symptoms. Ask your health care provider if a screening pelvic exam is right for you.  If you have had past treatment for cervical cancer or a condition that could lead to cancer, you need Pap tests and screening for cancer for at least 20 years after your treatment. If Pap tests have been discontinued, your risk factors (such as having a new sexual partner) need to be reassessed to determine if screening should resume. Some women have medical problems that increase the chance of getting cervical cancer. In these cases, your health care  provider may recommend more frequent screening and Pap tests. Colorectal Cancer  This type of cancer can be detected and often prevented.  Routine colorectal cancer screening usually begins at 68 years of age and continues through 67 years of age.  Your health care provider may recommend screening at an earlier age if you have risk factors for colon cancer.  Your health care provider may also recommend using home test kits to check for hidden blood in the stool.  A small camera at the end of a tube can be used to examine your colon directly (sigmoidoscopy or colonoscopy). This is done to check for the earliest forms of colorectal cancer.  Routine screening usually begins at age 21.  Direct examination of the colon should be repeated every 5-10 years through 68 years of age. However, you may need to be screened more often if early forms of precancerous polyps or small growths are found. Skin Cancer  Check your skin from head to toe regularly.  Tell your health care provider about any new moles or changes in moles, especially if there is a change in a mole's shape or color.  Also tell your health care provider if you have a mole that is larger than the size of a pencil eraser.  Always use sunscreen. Apply sunscreen liberally and repeatedly throughout the day.  Protect yourself by wearing long sleeves, pants, a wide-brimmed hat, and sunglasses whenever you are outside. HEART DISEASE, DIABETES, AND HIGH BLOOD PRESSURE   High blood pressure causes heart disease and increases the risk of stroke. High blood pressure is more likely to develop in:  People who have blood pressure in the high end of the normal range (130-139/85-89 mm Hg).  People who are overweight or obese.  People who are African American.  If you are 17-32 years of age, have your blood pressure checked every 3-5 years. If you are 83 years of age or older, have your blood pressure checked every year. You should have your  blood pressure measured twice--once when you are at a hospital or clinic, and once when you are not at a hospital or clinic. Record the average of the two measurements. To check your blood pressure when you are not at a hospital or clinic, you can use:  An automated blood pressure machine at a pharmacy.  A home blood pressure monitor.  If you are between 4 years and 86 years old, ask your health care provider if you should take aspirin to prevent strokes.  Have regular diabetes screenings. This involves taking a blood sample to check your fasting blood sugar level.  If you are at a normal weight and have a low risk for diabetes, have this test once every three years after 68 years of age.  If you are overweight and have a high risk for diabetes, consider being tested at a younger age or more often. PREVENTING INFECTION  Hepatitis B  If you have a higher risk for hepatitis B, you should be screened for this virus. You are considered at high risk for hepatitis B if:  You were born in a country where hepatitis B is common. Ask your health care provider which countries are considered high risk.  Your parents were born in a high-risk country, and you have not been immunized against hepatitis B (hepatitis B vaccine).  You have HIV or AIDS.  You use needles to inject street drugs.  You live with someone who has hepatitis B.  You have had sex with someone who has hepatitis B.  You get hemodialysis treatment.  You take certain medicines for conditions, including cancer, organ transplantation, and autoimmune conditions. Hepatitis C  Blood testing is recommended for:  Everyone born from [birth year] through 1963.  Anyone with known risk factors for hepatitis C. Sexually transmitted infections (STIs)  You should be screened for sexually transmitted infections (STIs) including gonorrhea and chlamydia if:  You are sexually active and are younger than 68 years of age.  You are older than 68  years of age and your health care provider tells you that you are at risk for this type of infection.  Your sexual activity has changed since you were last screened and you are at an increased risk for chlamydia or gonorrhea. Ask your health care provider if you are at risk.  If you do not have HIV, but are at risk, it may be recommended that you take a prescription medicine daily to prevent HIV infection. This is called pre-exposure  prophylaxis (PrEP). You are considered at risk if:  You are sexually active and do not regularly use condoms or know the HIV status of your partner(s).  You take drugs by injection.  You are sexually active with a partner who has HIV. Talk with your health care provider about whether you are at high risk of being infected with HIV. If you choose to begin PrEP, you should first be tested for HIV. You should then be tested every 3 months for as long as you are taking PrEP.  PREGNANCY   If you are premenopausal and you may become pregnant, ask your health care provider about preconception counseling.  If you may become pregnant, take 400 to 800 micrograms (mcg) of folic acid every day.  If you want to prevent pregnancy, talk to your health care provider about birth control (contraception). OSTEOPOROSIS AND MENOPAUSE   Osteoporosis is a disease in which the bones lose minerals and strength with aging. This can result in serious bone fractures. Your risk for osteoporosis can be identified using a bone density scan.  If you are 17 years of age or older, or if you are at risk for osteoporosis and fractures, ask your health care provider if you should be screened.  Ask your health care provider whether you should take a calcium or vitamin D supplement to lower your risk for osteoporosis.  Menopause may have certain physical symptoms and risks.  Hormone replacement therapy may reduce some of these symptoms and risks. Talk to your health care provider about whether  hormone replacement therapy is right for you.  HOME CARE INSTRUCTIONS   Schedule regular health, dental, and eye exams.  Stay current with your immunizations.   Do not use any tobacco products including cigarettes, chewing tobacco, or electronic cigarettes.  If you are pregnant, do not drink alcohol.  If you are breastfeeding, limit how much and how often you drink alcohol.  Limit alcohol intake to no more than 1 drink per day for nonpregnant women. One drink equals 12 ounces of beer, 5 ounces of wine, or 1 ounces of hard liquor.  Do not use street drugs.  Do not share needles.  Ask your health care provider for help if you need support or information about quitting drugs.  Tell your health care provider if you often feel depressed.  Tell your health care provider if you have ever been abused or do not feel safe at home.   This information is not intended to replace advice given to you by your health care provider. Make sure you discuss any questions you have with your health care provider.   Document Released: 01/31/2011 Document Revised: 08/08/2014 Document Reviewed: 06/19/2013 Elsevier Interactive Patient Education [employer].

## 2016-04-01 NOTE — Assessment & Plan Note (Signed)
Stable on cymbalta and wellbutrin 150mg  SR daily however noticing increased nervous energy which she doesn't like - possibly from activating wellbutrin. Will decrease dose to 100mg  SR once daily. Pt agrees. Support provided for recent unfortunate family stressors.

## 2016-04-01 NOTE — Assessment & Plan Note (Signed)
Improving levels on 50,000 IU weekly (started 03/2015). Continue.

## 2016-05-02 ENCOUNTER — Other Ambulatory Visit: Payer: Self-pay | Admitting: *Deleted

## 2016-05-02 NOTE — Telephone Encounter (Signed)
Ok to refill? Last filled 12/30/15 #30 3 RF

## 2016-05-03 MED ORDER — ZOLPIDEM TARTRATE 10 MG PO TABS: ORAL_TABLET | ORAL | 3 refills | Status: DC

## 2016-05-03 NOTE — Telephone Encounter (Signed)
Pt left v/m wanting to know status of zolpidem refill; advised med called in this afternoon. Pt voiced understanding.

## 2016-05-03 NOTE — Telephone Encounter (Signed)
plz phone in. 

## 2016-05-03 NOTE — Telephone Encounter (Signed)
Rx called in as directed.   

## 2016-05-09 ENCOUNTER — Ambulatory Visit: Payer: Commercial Managed Care - HMO

## 2016-05-18 ENCOUNTER — Ambulatory Visit
Admission: RE | Admit: 2016-05-18 | Discharge: 2016-05-18 | Disposition: A | Discharge status: Home / Self Care | Payer: Commercial Managed Care - HMO | Source: Ambulatory Visit | Attending: Family Medicine | Admitting: Family Medicine

## 2016-05-18 ENCOUNTER — Encounter: Payer: Self-pay | Admitting: Family Medicine

## 2016-05-18 DIAGNOSIS — Z780 Asymptomatic menopausal state: Secondary | ICD-10-CM | POA: Insufficient documentation

## 2016-05-18 DIAGNOSIS — M810 Age-related osteoporosis without current pathological fracture: Secondary | ICD-10-CM

## 2016-08-23 ENCOUNTER — Telehealth: Payer: Self-pay | Admitting: Family Medicine

## 2016-08-23 NOTE — Telephone Encounter (Signed)
Patient Name: Courtney Thomas DOB: 07/15/1946 Initial Comment [name] states she just got back from a week long trip, [place], she thinks she has bed bug bites all over her. Nurse Assessment Guidelines Guideline Title Affirmed Question Affirmed Notes Final Disposition User FINAL ATTEMPT MADE - no message left Ronnald Ramp, Therapist, sports, [employer]

## 2016-08-23 NOTE — Telephone Encounter (Signed)
Pt has appt with Dr Silvio Pate 08/24/16 at 11:15.

## 2016-08-24 ENCOUNTER — Encounter: Payer: Self-pay | Admitting: Internal Medicine

## 2016-08-24 ENCOUNTER — Ambulatory Visit (INDEPENDENT_AMBULATORY_CARE_PROVIDER_SITE_OTHER): Payer: Medicare HMO | Admitting: Internal Medicine

## 2016-08-24 VITALS — BP 112/72 | HR 77 | Temp 98.2°F | Wt 120.0 lb

## 2016-08-24 DIAGNOSIS — S46819A Strain of other muscles, fascia and tendons at shoulder and upper arm level, unspecified arm, initial encounter: Secondary | ICD-10-CM | POA: Diagnosis not present

## 2016-08-24 NOTE — Progress Notes (Signed)
Subjective:    Patient ID: Courtney Thomas, female    DOB: 1946-01-04, 71 y.o.   MRN: [id number]  HPI Here with several concerns after a recent cruise to [place] home 4 days ago  [employer] with the dolphins 1 week ago Noticed stiff neck that night and swelling in left foot Has used heat rub--not too helpful  Then noticed rash on inside of left forearm--at antecubital fossa yesterday Better today Worried about bed bugs or something  Now with slight sore throat Eye was slightly swollen No fever  Current Outpatient Prescriptions on File Prior to Visit  Medication Sig Dispense Refill  . alendronate (FOSAMAX) 70 MG tablet Take 1 tablet (70 mg total) by mouth every 7 (seven) days. Take with a full glass of water on an empty stomach. 12 tablet 3  . atorvastatin (LIPITOR) 10 MG tablet Take 1 tablet (10 mg total) by mouth daily. 90 tablet 3  . buPROPion (WELLBUTRIN SR) 100 MG 12 hr tablet Take 1 tablet (100 mg total) by mouth daily. 30 tablet 11  . Calcium Carbonate-Vitamin D (CALCIUM 600/VITAMIN D) 600-400 MG-UNIT chew tablet Chew 1 tablet by mouth daily.    . cetirizine (ZYRTEC) 10 MG tablet Take 1 tablet (10 mg total) by mouth daily. 90 tablet 3  . DULoxetine (CYMBALTA) 60 MG capsule Take 1 capsule (60 mg total) by mouth daily. 30 capsule 11  . ibuprofen (ADVIL,MOTRIN) 400 MG tablet Take 1.5 tablets (600 mg total) by mouth every 6 (six) hours as needed for moderate pain. (Patient taking differently: Take 200 mg by mouth every 6 (six) hours as needed for moderate pain. ) 20 tablet 0  . lisinopril-hydrochlorothiazide (PRINZIDE,ZESTORETIC) 10-12.5 MG tablet Take 1 tablet by mouth daily. 90 tablet 3  . naproxen sodium (ANAPROX) 220 MG tablet Take 220 mg by mouth every 4 (four) hours as needed. For arthritis.    . Vitamin D, Ergocalciferol, (DRISDOL) 50000 UNITS CAPS capsule Take 1 capsule (50,000 Units total) by mouth every 7 (seven) days. 12 capsule 3  . zolpidem (AMBIEN) 10 MG tablet  TAKE ONE (1) TABLET AT BEDTIME 30 tablet 3   No current facility-administered medications on file prior to visit.     Allergies  Allergen Reactions  . Eggs Or Egg-Derived Products Anaphylaxis and Swelling  . Codeine Nausea Only    Past Medical History:  Diagnosis Date  . Adjustment disorder with depressed mood   . Asthma   . B12 deficiency anemia 2014   mild  . Frequent headaches   . History of chicken pox   . Hyperlipidemia   . Hypertension   . Insomnia   . 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 Seasonal allergic rhinitis     Past Surgical History:  Procedure Laterality Date  . BREAST CYST ASPIRATION    . BREAST EXCISIONAL BIOPSY Right 1971   neg  . COLONOSCOPY  07/2013   WNL per patient ([facility])  . dexa  03/2014   T score spine -3.5, hip -2.0  . NASAL SINUS SURGERY     deviated septum  . TONSILLECTOMY  1970  . TOTAL ABDOMINAL HYSTERECTOMY W/ BILATERAL SALPINGOOPHORECTOMY  1994   uterine cyst with heavy bleeding    Family History  Problem Relation Age of Onset  . Cancer Mother 62    lung met to brain, smoker  . Stroke Mother   . Alzheimer's disease Mother   .  Arthritis Father   . Arthritis Sister   . Cancer Brother 65    pancreatic  . Diabetes Maternal Grandmother     Social History   Social History  . Marital status: Married    Spouse name: N/A  . Number of children: N/A  . Years of education: N/A   Occupational History  . Not on file.   Social History Main Topics  . Smoking status: Former Research scientist (life sciences)  . Smokeless tobacco: Never Used     Comment: minimal smoking as a teenager  . Alcohol use No  . Drug use: No  . Sexual activity: No   Other Topics Concern  . Not on file   Social History Narrative   Lives alone - husband passed away 09/04/2013. 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 No oropharyngeal exudate.  Neck:  Some mild tenderness along trapezius muscles Mild restriction of flexion/extension and tilt. Rotation is fine  Pulmonary/Chest: Effort normal and breath sounds normal. No respiratory distress. She has no wheezes. She has no rales.  Lymphadenopathy:    She has no cervical adenopathy.  Skin: No rash noted.          Assessment & Plan:

## 2016-08-24 NOTE — Assessment & Plan Note (Signed)
Mild and should be self limited Discussed OTC heat rub, aleve prn The other symptoms are not related and don't seem to be of any concern--reassured

## 2016-08-24 NOTE — Telephone Encounter (Signed)
Will assess at OV 

## 2016-08-24 NOTE — Progress Notes (Signed)
Pre visit review using our clinic review tool, if applicable. No additional management support is needed unless otherwise documented below in the visit note. 

## 2016-08-30 ENCOUNTER — Other Ambulatory Visit: Payer: Self-pay | Admitting: Family Medicine

## 2016-08-30 NOTE — Telephone Encounter (Signed)
Ok to refill? Last filled 05/03/16 #30 [id number]

## 2016-08-30 NOTE — Telephone Encounter (Signed)
plz phone in. 

## 2016-08-31 NOTE — Telephone Encounter (Signed)
Rx called in as directed.   

## 2016-12-12 ENCOUNTER — Other Ambulatory Visit: Payer: Self-pay | Admitting: Family Medicine

## 2016-12-27 ENCOUNTER — Other Ambulatory Visit: Payer: Self-pay | Admitting: Family Medicine

## 2016-12-27 NOTE — Telephone Encounter (Signed)
Received refill electronically Last refill 08/30/16 Last office visit 08/24/16/acute

## 2016-12-27 NOTE — Telephone Encounter (Signed)
plz phone in. 

## 2016-12-28 NOTE — Telephone Encounter (Signed)
Rx called in to requested pharmacy 

## 2016-12-29 ENCOUNTER — Other Ambulatory Visit: Payer: Self-pay | Admitting: Family Medicine

## 2016-12-29 NOTE — Telephone Encounter (Signed)
Courtney Thomas called Courtney Thomas does not have zolpidem refill; I spoke with [facility] at [employer] and she could not find refill called in on 12/28/16. Medication phoned to [facility] at [employer] as instructed.pt voiced understanding and will contact pharmacy.

## 2017-01-12 DIAGNOSIS — S9031XA Contusion of right foot, initial encounter: Secondary | ICD-10-CM | POA: Diagnosis not present

## 2017-01-12 DIAGNOSIS — M25571 Pain in right ankle and joints of right foot: Secondary | ICD-10-CM | POA: Diagnosis not present

## 2017-01-12 DIAGNOSIS — S9001XA Contusion of right ankle, initial encounter: Secondary | ICD-10-CM | POA: Diagnosis not present

## 2017-01-17 ENCOUNTER — Encounter: Payer: Self-pay | Admitting: Family Medicine

## 2017-01-17 ENCOUNTER — Ambulatory Visit (INDEPENDENT_AMBULATORY_CARE_PROVIDER_SITE_OTHER): Payer: Medicare HMO | Admitting: Family Medicine

## 2017-01-17 VITALS — BP 106/72 | HR 74 | Temp 98.4°F | Ht 58.5 in | Wt 129.8 lb

## 2017-01-17 DIAGNOSIS — S93401D Sprain of unspecified ligament of right ankle, subsequent encounter: Secondary | ICD-10-CM

## 2017-01-17 DIAGNOSIS — S93401A Sprain of unspecified ligament of right ankle, initial encounter: Secondary | ICD-10-CM | POA: Insufficient documentation

## 2017-01-17 NOTE — Patient Instructions (Signed)
I seen phone note from [place] that mentions no fractures seen. Continue short boot for 3-4 weeks then I recommend either ASO brace or pull up ankle brace for future prevention Start doing strengthening exercises provided today - once pain has improved.

## 2017-01-17 NOTE — Progress Notes (Signed)
BP 106/72   Pulse 74   Temp 98.4 F (36.9 C)   Ht 4' 10.5" (1.486 m)   Wt 129 lb 12 oz (58.9 kg) Comment: orthopedic boot on  SpO2 98%   BMI 26.66 kg/m    CC: f/u [facility] Subjective:    Patient ID: Courtney Thomas, female    DOB: Aug 27, 1945, 71 y.o.   MRN: [id number]  HPI: Courtney Thomas is a 71 y.o. female presenting on 01/17/2017 for Follow-up ([place]-- x-rays taken on ankle--fell carrying dog food going thru the grass)   Seen 6/14 at [facility] - note reviewed. Rolled R ankle while walking carrying dog food, slipped in grass. Treated with short boot. She has been unable to receive xray results from [facility]. She has been treating with boot, elevation, ice and rest. Taking tylenol 250mg  6-8 a day. Able to bear weight, boot use helping.  Relevant past medical, surgical, family and social history reviewed and updated as indicated. Interim medical history since our last visit reviewed. Allergies and medications reviewed and updated. Outpatient Medications Prior to Visit  Medication Sig Dispense Refill  . alendronate (FOSAMAX) 70 MG tablet Take 1 tablet (70 mg total) by mouth every 7 (seven) days. Take with a full glass of water on an empty stomach. 12 tablet 3  . atorvastatin (LIPITOR) 10 MG tablet TAKE ONE (1) TABLET EACH DAY 90 tablet 0  . buPROPion (WELLBUTRIN SR) 100 MG 12 hr tablet Take 1 tablet (100 mg total) by mouth daily. 30 tablet 11  . Calcium Carbonate-Vitamin D (CALCIUM 600/VITAMIN D) 600-400 MG-UNIT chew tablet Chew 1 tablet by mouth daily.    . cetirizine (ZYRTEC) 10 MG tablet Take 1 tablet (10 mg total) by mouth daily. 90 tablet 3  . DULoxetine (CYMBALTA) 60 MG capsule Take 1 capsule (60 mg total) by mouth daily. 30 capsule 11  . ibuprofen (ADVIL,MOTRIN) 400 MG tablet Take 1.5 tablets (600 mg total) by mouth every 6 (six) hours as needed for moderate pain. (Patient taking differently: Take 200 mg by mouth every 6 (six) hours as needed for  moderate pain. ) 20 tablet 0  . lisinopril-hydrochlorothiazide (PRINZIDE,ZESTORETIC) 10-12.5 MG tablet Take 1 tablet by mouth daily. 90 tablet 3  . naproxen sodium (ANAPROX) 220 MG tablet Take 220 mg by mouth every 4 (four) hours as needed. For arthritis.    . Vitamin D, Ergocalciferol, (DRISDOL) 50000 UNITS CAPS capsule Take 1 capsule (50,000 Units total) by mouth every 7 (seven) days. 12 capsule 3  . zolpidem (AMBIEN) 10 MG tablet TAKE ONE TABLET BY MOUTH AT BEDTIME 30 tablet 3   No facility-administered medications prior to visit.      Per HPI unless specifically indicated in ROS section below Review of Systems     Objective:    BP 106/72   Pulse 74   Temp 98.4 F (36.9 C)   Ht 4' 10.5" (1.486 m)   Wt 129 lb 12 oz (58.9 kg) Comment: orthopedic boot on  SpO2 98%   BMI 26.66 kg/m   Wt Readings from Last 3 Encounters:  01/17/17 129 lb 12 oz (58.9 kg)  08/24/16 120 lb (54.4 kg)  04/01/16 118 lb (53.5 kg)    Physical Exam  Constitutional: She appears well-developed and well-nourished. No distress.  Musculoskeletal: She exhibits edema (R ankle).  2+ DP bilaterally  Sensation intact L ankle WNL R ankle with marked swelling lateral malleolus No obvious ligament laxity, testing somewhat limited  by pain/swelling Swelling present throughout dorsal midfoot with tenderness to palpation of midfoot No pain at base of 5th MT, no pain at navicular or significant pain to palpation posterior malleoli Tender with squeeze test lower leg No pain with calcaneal squeeze, no pain at achilles tendon, no pain with axial loading of toes, no pain at MTPs.   Skin: Skin is warm and dry. No rash noted.  Nursing note and vitals reviewed.      Assessment & Plan:   Problem List Items Addressed This Visit    Right ankle sprain - Primary    I don't see results in care everywhere. There is mention in office note of "plain film right foot without definite acute bony process". I was able to pull up  phone note from [facility] where Dr Sherilyn Cooter mentions radiology read without acute fracture. Discussed with patient, reassured.  As continues slowly improving, discussed continued monitoring without need for rpt films today. rec continue ibuprofen, tylenol, elevation, ice, short boot. Advised as pain improves, to transition to ASO brace or pull up ankle brace especially with activity. I also provided patient with stretching/strengthening exercises from [facility] pt advisor with instructions to start doing exercises as pain improves.  Update if not improving with treatment.           Follow up plan: Return if symptoms worsen or fail to improve.  Ria Bush, MD

## 2017-01-17 NOTE — Assessment & Plan Note (Signed)
I don't see results in care everywhere. There is mention in office note of "plain film right foot without definite acute bony process". I was able to pull up phone note from [facility] where Dr Sherilyn Cooter mentions radiology read without acute fracture. Discussed with patient, reassured.  As continues slowly improving, discussed continued monitoring without need for rpt films today. rec continue ibuprofen, tylenol, elevation, ice, short boot. Advised as pain improves, to transition to ASO brace or pull up ankle brace especially with activity. I also provided patient with stretching/strengthening exercises from [facility] pt advisor with instructions to start doing exercises as pain improves.  Update if not improving with treatment.

## 2017-02-22 ENCOUNTER — Other Ambulatory Visit: Payer: Self-pay | Admitting: Family Medicine

## 2017-02-22 DIAGNOSIS — Z1231 Encounter for screening mammogram for malignant neoplasm of breast: Secondary | ICD-10-CM

## 2017-03-06 ENCOUNTER — Ambulatory Visit
Admission: RE | Admit: 2017-03-06 | Discharge: 2017-03-06 | Disposition: A | Discharge status: Home / Self Care | Payer: Medicare HMO | Source: Ambulatory Visit | Attending: Family Medicine | Admitting: Family Medicine

## 2017-03-06 DIAGNOSIS — Z1231 Encounter for screening mammogram for malignant neoplasm of breast: Secondary | ICD-10-CM

## 2017-03-08 ENCOUNTER — Encounter: Payer: Self-pay | Admitting: *Deleted

## 2017-03-26 ENCOUNTER — Other Ambulatory Visit: Payer: Self-pay | Admitting: Family Medicine

## 2017-03-26 DIAGNOSIS — E785 Hyperlipidemia, unspecified: Secondary | ICD-10-CM

## 2017-03-26 DIAGNOSIS — D519 Vitamin B12 deficiency anemia, unspecified: Secondary | ICD-10-CM

## 2017-03-26 DIAGNOSIS — E559 Vitamin D deficiency, unspecified: Secondary | ICD-10-CM

## 2017-03-27 ENCOUNTER — Ambulatory Visit (INDEPENDENT_AMBULATORY_CARE_PROVIDER_SITE_OTHER): Payer: Medicare HMO

## 2017-03-27 VITALS — BP 102/64 | HR 78 | Temp 98.4°F | Ht 58.25 in | Wt 120.5 lb

## 2017-03-27 DIAGNOSIS — D519 Vitamin B12 deficiency anemia, unspecified: Secondary | ICD-10-CM | POA: Diagnosis not present

## 2017-03-27 DIAGNOSIS — E785 Hyperlipidemia, unspecified: Secondary | ICD-10-CM

## 2017-03-27 DIAGNOSIS — E559 Vitamin D deficiency, unspecified: Secondary | ICD-10-CM

## 2017-03-27 DIAGNOSIS — Z0000 Encounter for general adult medical examination without abnormal findings: Secondary | ICD-10-CM

## 2017-03-27 LAB — CBC WITH DIFFERENTIAL/PLATELET
BASOS PCT: 1 % (ref 0.0–3.0)
Basophils Absolute: 0.1 10*3/uL (ref 0.0–0.1)
EOS PCT: 3.6 % (ref 0.0–5.0)
Eosinophils Absolute: 0.2 10*3/uL (ref 0.0–0.7)
HEMATOCRIT: 37.5 % (ref 36.0–46.0)
HEMOGLOBIN: 12.3 g/dL (ref 12.0–15.0)
LYMPHS PCT: 28 % (ref 12.0–46.0)
Lymphs Abs: 1.6 10*3/uL (ref 0.7–4.0)
MCHC: 32.8 g/dL (ref 30.0–36.0)
MCV: 89.6 fl (ref 78.0–100.0)
MONOS PCT: 7.9 % (ref 3.0–12.0)
Monocytes Absolute: 0.5 10*3/uL (ref 0.1–1.0)
Neutro Abs: 3.5 10*3/uL (ref 1.4–7.7)
Neutrophils Relative %: 59.5 % (ref 43.0–77.0)
Platelets: 243 10*3/uL (ref 150.0–400.0)
RBC: 4.19 Mil/uL (ref 3.87–5.11)
RDW: 13.8 % (ref 11.5–15.5)
WBC: 5.8 10*3/uL (ref 4.0–10.5)

## 2017-03-27 LAB — LIPID PANEL
CHOL/HDL RATIO: 2
Cholesterol: 160 mg/dL (ref 0–200)
HDL: 67.9 mg/dL (ref >39.00)
LDL Cholesterol: 79 mg/dL (ref 0–99)
NONHDL: 92.34
TRIGLYCERIDES: 65 mg/dL (ref 0.0–149.0)
VLDL: 13 mg/dL (ref 0.0–40.0)

## 2017-03-27 LAB — BASIC METABOLIC PANEL
BUN: 16 mg/dL (ref 6–23)
CHLORIDE: 102 meq/L (ref 96–112)
CO2: 32 mEq/L (ref 19–32)
Calcium: 9.6 mg/dL (ref 8.4–10.5)
Creatinine, Ser: 0.82 mg/dL (ref 0.40–1.20)
GFR: 73.34 mL/min (ref >60.00)
Glucose, Bld: 97 mg/dL (ref 70–99)
POTASSIUM: 3.7 meq/L (ref 3.5–5.1)
SODIUM: 138 meq/L (ref 135–145)

## 2017-03-27 LAB — VITAMIN B12: Vitamin B-12: 215 pg/mL (ref 211–911)

## 2017-03-27 LAB — VITAMIN D 25 HYDROXY (VIT D DEFICIENCY, FRACTURES): VITD: 24.16 ng/mL — ABNORMAL LOW (ref 30.00–100.00)

## 2017-03-27 NOTE — Patient Instructions (Signed)
Courtney Thomas , Thank you for taking time to come for your Medicare Wellness Visit. I appreciate your ongoing commitment to your health goals. Please review the following plan we discussed and let me know if I can assist you in the future.   These are the goals we discussed: Goals    . Increase physical activity          Starting 03/27/2017, I will continue to walk at least 30 min twice daily.        This is a list of the screening recommended for you and due dates:  Health Maintenance  Topic Date Due  . Flu Shot  10/29/2017*  . Pneumonia vaccines (2 of 2 - PPSV23) 10/29/2017*  . DTaP/Tdap/Td vaccine (2 - Td) 03/25/2024*  . Mammogram  03/07/2019  . Tetanus Vaccine  08/01/2021  . Colon Cancer Screening  08/02/2023  . DEXA scan (bone density measurement)  Completed  .  Hepatitis C: One time screening is recommended by Center for Disease Control  (CDC) for  adults born from [birth year] through 1963.   Completed  *Topic was postponed. The date shown is not the original due date.   Preventive Care for Adults  A healthy lifestyle and preventive care can promote health and wellness. Preventive health guidelines for adults include the following key practices.  . A routine yearly physical is a good way to check with your health care provider about your health and preventive screening. It is a chance to share any concerns and updates on your health and to receive a thorough exam.  . Visit your dentist for a routine exam and preventive care every 6 months. Brush your teeth twice a day and floss once a day. Good oral hygiene prevents tooth decay and gum disease.  . The frequency of eye exams is based on your age, health, family medical history, use  of contact lenses, and other factors. Follow your health care provider's ecommendations for frequency of eye exams.  . Eat a healthy diet. Foods like vegetables, fruits, whole grains, low-fat dairy products, and lean protein foods contain the nutrients you  need without too many calories. Decrease your intake of foods high in solid fats, added sugars, and salt. Eat the right amount of calories for you. Get information about a proper diet from your health care provider, if necessary.  . Regular physical exercise is one of the most important things you can do for your health. Most adults should get at least 150 minutes of moderate-intensity exercise (any activity that increases your heart rate and causes you to sweat) each week. In addition, most adults need muscle-strengthening exercises on 2 or more days a week.  Silver Sneakers may be a benefit available to you. To determine eligibility, you may visit the website: www.silversneakers.com or contact program at (989) 566-6526 Mon-Fri between 8AM-8PM.   . Maintain a healthy weight. The body mass index (BMI) is a screening tool to identify possible weight problems. It provides an estimate of body fat based on height and weight. Your health care provider can find your BMI and can help you achieve or maintain a healthy weight.   For adults 20 years and older: ? A BMI below 18.5 is considered underweight. ? A BMI of 18.5 to 24.9 is normal. ? A BMI of 25 to 29.9 is considered overweight. ? A BMI of 30 and above is considered obese.   . Maintain normal blood lipids and cholesterol levels by exercising and minimizing your intake of  saturated fat. Eat a balanced diet with plenty of fruit and vegetables. Blood tests for lipids and cholesterol should begin at age 32 and be repeated every 5 years. If your lipid or cholesterol levels are high, you are over 50, or you are at high risk for heart disease, you may need your cholesterol levels checked more frequently. Ongoing high lipid and cholesterol levels should be treated with medicines if diet and exercise are not working.  . If you smoke, find out from your health care provider how to quit. If you do not use tobacco, please do not start.  . If you choose to drink  alcohol, please do not consume more than 2 drinks per day. One drink is considered to be 12 ounces (355 mL) of beer, 5 ounces (148 mL) of wine, or 1.5 ounces (44 mL) of liquor.  . If you are 25-29 years old, ask your health care provider if you should take aspirin to prevent strokes.  . Use sunscreen. Apply sunscreen liberally and repeatedly throughout the day. You should seek shade when your shadow is shorter than you. Protect yourself by wearing long sleeves, pants, a wide-brimmed hat, and sunglasses year round, whenever you are outdoors.  . Once a month, do a whole body skin exam, using a mirror to look at the skin on your back. Tell your health care provider of new moles, moles that have irregular borders, moles that are larger than a pencil eraser, or moles that have changed in shape or color.

## 2017-03-27 NOTE — Progress Notes (Signed)
PCP notes:   Health maintenance:  Flu vaccine - addressed; pt needs egg-free vaccine PNA vaccine - postponed until flu vaccine is taken  Abnormal screenings:   Hearing - failed Depression score: 1  Patient concerns:   None  Nurse concerns:  None  Next PCP appt:   04/05/17 @ 1600

## 2017-03-27 NOTE — Progress Notes (Signed)
Pre visit review using our clinic review tool, if applicable. No additional management support is needed unless otherwise documented below in the visit note. 

## 2017-03-27 NOTE — Progress Notes (Signed)
Subjective:   Courtney Thomas is a 71 y.o. female who presents for Medicare Annual (Subsequent) preventive examination.  Review of Systems:  N/A Cardiac Risk Factors include: advanced age (>76mn, >>28women);dyslipidemia;hypertension     Objective:     Vitals: BP 102/64 (BP Location: Right Arm, Patient Position: Sitting, Cuff Size: Normal)   Pulse 78   Temp 98.4 F (36.9 C) (Oral)   Ht 4' 10.25" (1.48 m) Comment: no shoes  Wt 120 lb 8 oz (54.7 kg)   SpO2 98%   BMI 24.97 kg/m   Body mass index is 24.97 kg/m.   Tobacco History  Smoking Status  . Former Smoker  Smokeless Tobacco  . Never Used    Comment: minimal smoking as a teenager     Counseling given: No   Past Medical History:  Diagnosis Date  . Adjustment disorder with depressed mood   . Asthma   . B12 deficiency anemia 2014   mild  . Frequent headaches   . History of chicken pox   . Hyperlipidemia   . Hypertension   . Insomnia   . 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 Seasonal allergic rhinitis    Past Surgical History:  Procedure Laterality Date  . BREAST CYST ASPIRATION    . BREAST EXCISIONAL BIOPSY Right 1971   neg  . COLONOSCOPY  07/2013   WNL per patient ([facility])  . dexa  03/2014   T score spine -3.5, hip -2.0  . NASAL SINUS SURGERY     deviated septum  . TONSILLECTOMY  1970  . TOTAL ABDOMINAL HYSTERECTOMY W/ BILATERAL SALPINGOOPHORECTOMY  1994   uterine cyst with heavy bleeding   Family History  Problem Relation Age of Onset  . Cancer Mother 621      lung met to brain, smoker  . Stroke Mother   . Alzheimer's disease Mother   . Arthritis Father   . Arthritis Sister   . Cancer Brother [phone]      pancreatic  . Diabetes Maternal Grandmother   . Breast cancer Neg Hx    History  Sexual Activity  . Sexual activity: No    Outpatient Encounter Prescriptions as of 03/27/2017  Medication Sig  . alendronate (FOSAMAX) 70 MG tablet  Take 1 tablet (70 mg total) by mouth every 7 (seven) days. Take with a full glass of water on an empty stomach.  .Marland Kitchenatorvastatin (LIPITOR) 10 MG tablet TAKE ONE (1) TABLET EACH DAY  . buPROPion (WELLBUTRIN SR) 100 MG 12 hr tablet Take 1 tablet (100 mg total) by mouth daily.  . Calcium Carbonate-Vitamin D (CALCIUM 600/VITAMIN D) 600-400 MG-UNIT chew tablet Chew 1 tablet by mouth daily.  . cetirizine (ZYRTEC) 10 MG tablet Take 1 tablet (10 mg total) by mouth daily.  . DULoxetine (CYMBALTA) 60 MG capsule Take 1 capsule (60 mg total) by mouth daily.  .Marland Kitchenibuprofen (ADVIL,MOTRIN) 400 MG tablet Take 1.5 tablets (600 mg total) by mouth every 6 (six) hours as needed for moderate pain. (Patient taking differently: Take 200 mg by mouth every 6 (six) hours as needed for moderate pain. )  . lisinopril-hydrochlorothiazide (PRINZIDE,ZESTORETIC) 10-12.5 MG tablet Take 1 tablet by mouth daily.  . naproxen sodium (ANAPROX) 220 MG tablet Take 220 mg by mouth every 4 (four) hours as needed. For arthritis.  . Vitamin D, Ergocalciferol, (DRISDOL) 50000 UNITS CAPS capsule Take 1 capsule (50,000 Units total)  by mouth every 7 (seven) days.  Marland Kitchen zolpidem (AMBIEN) 10 MG tablet TAKE ONE TABLET BY MOUTH AT BEDTIME   No facility-administered encounter medications on file as of 03/27/2017.     Activities of Daily Living In your present state of health, do you have any difficulty performing the following activities: 03/27/2017  Hearing? N  Vision? N  Difficulty concentrating or making decisions? N  Walking or climbing stairs? N  Dressing or bathing? N  Doing errands, shopping? N  Preparing Food and eating ? N  Using the Toilet? N  In the past six months, have you accidently leaked urine? N  Do you have problems with loss of bowel control? N  Managing your Medications? N  Managing your Finances? N  Housekeeping or managing your Housekeeping? N  Some recent data might be hidden    Patient Care Team: Ria Bush, MD  as PCP - General (Family Medicine) Lorelee Cover., MD as Consulting Physician (Ophthalmology) Mellissa Kohut, DDS, PA as Consulting Physician (Dentistry)    Assessment:     Hearing Screening   '125Hz'$  '250Hz'$  '500Hz'$  '1000Hz'$  '2000Hz'$  '3000Hz'$  '4000Hz'$  '6000Hz'$  '8000Hz'$   Right ear:   40 40 40  40    Left ear:   40 0 0  0    Vision Screening Comments: Last vision exam in August 2017   Exercise Activities and Dietary recommendations Current Exercise Habits: Home exercise routine, Type of exercise: walking, Time (Minutes): 30, Frequency (Times/Week): 2, Weekly Exercise (Minutes/Week): 60, Intensity: Mild, Exercise limited by: None identified  Goals    . Increase physical activity          Starting 03/27/2017, I will continue to walk at least 30 min twice daily.       Fall Risk Fall Risk  03/27/2017 03/25/2016 03/17/2015 02/21/2014  Falls in the past year? No No No Yes  Number falls in past yr: - - - 1  Comment - - - tripped over her pants at home  Injury with Fall? - - - Yes  Comment - - - Broke nose 11/2013   Depression Screen PHQ 2/9 Scores 03/27/2017 03/25/2016 03/17/2015 02/21/2014  PHQ - 2 Score 0 1 0 4  PHQ- 9 Score 1 - - 7     Cognitive Function MMSE - Mini Mental State Exam 03/27/2017 03/25/2016  Orientation to time 5 5  Orientation to Place 5 5  Registration 3 3  Attention/ Calculation 0 0  Recall 3 3  Language- name 2 objects 0 0  Language- repeat 1 1  Language- follow 3 step command 3 3  Language- read & follow direction 0 0  Write a sentence 0 0  Copy design 0 0  Total score 20 20     PLEASE NOTE: A Mini-Cog screen was completed. Maximum score is 20. A value of 0 denotes this part of Folstein MMSE was not completed or the patient failed this part of the Mini-Cog screening.   Mini-Cog Screening Orientation to Time - Max 5 pts Orientation to Place - Max 5 pts Registration - Max 3 pts Recall - Max 3 pts Language Repeat - Max 1 pts Language Follow 3 Step Command - Max 3 pts      Immunization History  Administered Date(s) Administered  . Influenza, Quadrivalent, Recombinant, Inj, Pf 04/01/2016  . Pneumococcal Conjugate-13 08/27/2015  . Tdap 08/02/2011   Screening Tests Health Maintenance  Topic Date Due  . INFLUENZA VACCINE  10/29/2017 (Originally 03/01/2017)  . PNA vac Low  Risk Adult (2 of 2 - PPSV23) 10/29/2017 (Originally 08/26/2016)  . DTaP/Tdap/Td (2 - Td) 03/25/2024 (Originally 08/01/2021)  . MAMMOGRAM  03/07/2019  . TETANUS/TDAP  08/01/2021  . COLONOSCOPY  08/02/2023  . DEXA SCAN  Completed  . Hepatitis C Screening  Completed      Plan:     I have personally reviewed and addressed the Medicare Annual Wellness questionnaire and have noted the following in the patient's chart:  A. Medical and social history B. Use of alcohol, tobacco or illicit drugs  C. Current medications and supplements D. Functional ability and status E.  Nutritional status F.  Physical activity G. Advance directives H. List of other physicians I.  Hospitalizations, surgeries, and ER visits in previous 12 months J.  [place] to include hearing, vision, cognitive, depression L. Referrals and appointments - none  In addition, I have reviewed and discussed with patient certain preventive protocols, quality metrics, and best practice recommendations. A written personalized care plan for preventive services as well as general preventive health recommendations were provided to patient.  See attached scanned questionnaire for additional information.   Signed,   Lindell Noe, MHA, BS, LPN Health Coach

## 2017-03-30 NOTE — Progress Notes (Signed)
I reviewed health advisor's note, was available for consultation, and agree with documentation and plan.  

## 2017-04-05 ENCOUNTER — Encounter: Payer: Medicare HMO | Admitting: Family Medicine

## 2017-04-05 ENCOUNTER — Ambulatory Visit (INDEPENDENT_AMBULATORY_CARE_PROVIDER_SITE_OTHER): Payer: Medicare HMO | Admitting: Family Medicine

## 2017-04-05 ENCOUNTER — Encounter: Payer: Self-pay | Admitting: Family Medicine

## 2017-04-05 VITALS — BP 108/62 | HR 87 | Temp 97.9°F | Ht <= 58 in | Wt 120.0 lb

## 2017-04-05 DIAGNOSIS — F5101 Primary insomnia: Secondary | ICD-10-CM | POA: Diagnosis not present

## 2017-04-05 DIAGNOSIS — Z23 Encounter for immunization: Secondary | ICD-10-CM

## 2017-04-05 DIAGNOSIS — Z7189 Other specified counseling: Secondary | ICD-10-CM

## 2017-04-05 DIAGNOSIS — E538 Deficiency of other specified B group vitamins: Secondary | ICD-10-CM | POA: Diagnosis not present

## 2017-04-05 DIAGNOSIS — M810 Age-related osteoporosis without current pathological fracture: Secondary | ICD-10-CM

## 2017-04-05 DIAGNOSIS — E785 Hyperlipidemia, unspecified: Secondary | ICD-10-CM

## 2017-04-05 DIAGNOSIS — Z91012 Allergy to eggs: Secondary | ICD-10-CM | POA: Diagnosis not present

## 2017-04-05 DIAGNOSIS — Z0000 Encounter for general adult medical examination without abnormal findings: Secondary | ICD-10-CM

## 2017-04-05 DIAGNOSIS — E559 Vitamin D deficiency, unspecified: Secondary | ICD-10-CM | POA: Diagnosis not present

## 2017-04-05 DIAGNOSIS — I10 Essential (primary) hypertension: Secondary | ICD-10-CM | POA: Diagnosis not present

## 2017-04-05 MED ORDER — ALENDRONATE SODIUM 70 MG PO TABS: 70.0000 mg | ORAL_TABLET | ORAL | 3 refills | Status: DC

## 2017-04-05 MED ORDER — BUPROPION HCL ER (SR) 100 MG PO TB12: 100.0000 mg | ORAL_TABLET | Freq: Every day | ORAL | 3 refills | Status: DC

## 2017-04-05 MED ORDER — VITAMIN D (ERGOCALCIFEROL) 1.25 MG (50000 UNIT) PO CAPS: 50000.0000 [IU] | ORAL_CAPSULE | ORAL | 3 refills | Status: DC

## 2017-04-05 MED ORDER — ATORVASTATIN CALCIUM 10 MG PO TABS: ORAL_TABLET | ORAL | 3 refills | Status: DC

## 2017-04-05 MED ORDER — DULOXETINE HCL 60 MG PO CPEP: 60.0000 mg | ORAL_CAPSULE | Freq: Every day | ORAL | 3 refills | Status: DC

## 2017-04-05 MED ORDER — CYANOCOBALAMIN 1000 MCG/ML IJ SOLN
1000.0000 ug | Freq: Once | INTRAMUSCULAR | Status: AC
  Administered 2017-04-05: 1000 ug via INTRAMUSCULAR

## 2017-04-05 MED ORDER — LISINOPRIL-HYDROCHLOROTHIAZIDE 10-12.5 MG PO TABS: 1.0000 | ORAL_TABLET | Freq: Every day | ORAL | 3 refills | Status: DC

## 2017-04-05 NOTE — Assessment & Plan Note (Signed)
Preventative protocols reviewed and updated unless pt declined. Discussed healthy diet and lifestyle.  

## 2017-04-05 NOTE — Assessment & Plan Note (Signed)
Stable to slightly improved DEXA 05/2016, reviewed with patient.  Fosamax started around 03/2014. rec continue for full 5 yrs as long as tolerated well and no upcoming dental work, then reassess.

## 2017-04-05 NOTE — Assessment & Plan Note (Signed)
Chronic, stable. Continue lower lisinopril hctz dose.

## 2017-04-05 NOTE — Assessment & Plan Note (Signed)
Continue ambien PRN.

## 2017-04-05 NOTE — Assessment & Plan Note (Signed)
Advanced directives: has at home. HCPOA is daughter Courtney Thomas. To bring me copy of living will.  ?

## 2017-04-05 NOTE — Patient Instructions (Addendum)
We will check on egg free flu shot today.  Pneumovax today.  b12 shot today, then schedule monthly shots for the next 6 months.  If interested, check with pharmacy about new 2 shot shingles series (shingrix).  Bring [place] copy of your advanced directive to update your chart.   Health Maintenance, Female Adopting a healthy lifestyle and getting preventive care can go a long way to promote health and wellness. Talk with your health care provider about what schedule of regular examinations is right for you. This is a good chance for you to check in with your provider about disease prevention and staying healthy. In between checkups, there are plenty of things you can do on your own. Experts have done a lot of research about which lifestyle changes and preventive measures are most likely to keep you healthy. Ask your health care provider for more information. Weight and diet Eat a healthy diet  Be sure to include plenty of vegetables, fruits, low-fat dairy products, and lean protein.  Do not eat a lot of foods high in solid fats, added sugars, or salt.  Get regular exercise. This is one of the most important things you can do for your health. ? Most adults should exercise for at least 150 minutes each week. The exercise should increase your heart rate and make you sweat (moderate-intensity exercise). ? Most adults should also do strengthening exercises at least twice a week. This is in addition to the moderate-intensity exercise.  Maintain a healthy weight  Body mass index (BMI) is a measurement that can be used to identify possible weight problems. It estimates body fat based on height and weight. Your health care provider can help determine your BMI and help you achieve or maintain a healthy weight.  For females 4 years of age and older: ? A BMI below 18.5 is considered underweight. ? A BMI of 18.5 to 24.9 is normal. ? A BMI of 25 to 29.9 is considered overweight. ? A BMI of 30 and above is  considered obese.  Watch levels of cholesterol and blood lipids  You should start having your blood tested for lipids and cholesterol at 69 years of age, then have this test every 5 years.  You may need to have your cholesterol levels checked more often if: ? Your lipid or cholesterol levels are high. ? You are older than 69 years of age. ? You are at high risk for heart disease.  Cancer screening Lung Cancer  Lung cancer screening is recommended for adults 56-60 years old who are at high risk for lung cancer because of a history of smoking.  A yearly low-dose CT scan of the lungs is recommended for people who: ? Currently smoke. ? Have quit within the past 15 years. ? Have at least a 30-pack-year history of smoking. A pack year is smoking an average of one pack of cigarettes a day for 1 year.  Yearly screening should continue until it has been 15 years since you quit.  Yearly screening should stop if you develop a health problem that would prevent you from having lung cancer treatment.  Breast Cancer  Practice breast self-awareness. This means understanding how your breasts normally appear and feel.  It also means doing regular breast self-exams. Let your health care provider know about any changes, no matter how small.  If you are in your 20s or 30s, you should have a clinical breast exam (CBE) by a health care provider every 1-3 years as part  of a regular health exam.  If you are 30 or older, have a CBE every year. Also consider having a breast X-ray (mammogram) every year.  If you have a family history of breast cancer, talk to your health care provider about genetic screening.  If you are at high risk for breast cancer, talk to your health care provider about having an MRI and a mammogram every year.  Breast cancer gene (BRCA) assessment is recommended for women who have family members with BRCA-related cancers. BRCA-related cancers  include: ? Breast. ? Ovarian. ? Tubal. ? Peritoneal cancers.  Results of the assessment will determine the need for genetic counseling and BRCA1 and BRCA2 testing.  Cervical Cancer Your health care provider may recommend that you be screened regularly for cancer of the pelvic organs (ovaries, uterus, and vagina). This screening involves a pelvic examination, including checking for microscopic changes to the surface of your cervix (Pap test). You may be encouraged to have this screening done every 3 years, beginning at age 70.  For women ages 17-65, health care providers may recommend pelvic exams and Pap testing every 3 years, or they may recommend the Pap and pelvic exam, combined with testing for human papilloma virus (HPV), every 5 years. Some types of HPV increase your risk of cervical cancer. Testing for HPV may also be done on women of any age with unclear Pap test results.  Other health care providers may not recommend any screening for nonpregnant women who are considered low risk for pelvic cancer and who do not have symptoms. Ask your health care provider if a screening pelvic exam is right for you.  If you have had past treatment for cervical cancer or a condition that could lead to cancer, you need Pap tests and screening for cancer for at least 20 years after your treatment. If Pap tests have been discontinued, your risk factors (such as having a new sexual partner) need to be reassessed to determine if screening should resume. Some women have medical problems that increase the chance of getting cervical cancer. In these cases, your health care provider may recommend more frequent screening and Pap tests.  Colorectal Cancer  This type of cancer can be detected and often prevented.  Routine colorectal cancer screening usually begins at 69 years of age and continues through 69 years of age.  Your health care provider may recommend screening at an earlier age if you have risk factors  for colon cancer.  Your health care provider may also recommend using home test kits to check for hidden blood in the stool.  A small camera at the end of a tube can be used to examine your colon directly (sigmoidoscopy or colonoscopy). This is done to check for the earliest forms of colorectal cancer.  Routine screening usually begins at age 85.  Direct examination of the colon should be repeated every 5-10 years through 69 years of age. However, you may need to be screened more often if early forms of precancerous polyps or small growths are found.  Skin Cancer  Check your skin from head to toe regularly.  Tell your health care provider about any new moles or changes in moles, especially if there is a change in a mole's shape or color.  Also tell your health care provider if you have a mole that is larger than the size of a pencil eraser.  Always use sunscreen. Apply sunscreen liberally and repeatedly throughout the day.  Protect yourself by wearing long  sleeves, pants, a wide-brimmed hat, and sunglasses whenever you are outside.  Heart disease, diabetes, and high blood pressure  High blood pressure causes heart disease and increases the risk of stroke. High blood pressure is more likely to develop in: ? People who have blood pressure in the high end of the normal range (130-139/85-89 mm Hg). ? People who are overweight or obese. ? People who are African American.  If you are 93-79 years of age, have your blood pressure checked every 3-5 years. If you are 85 years of age or older, have your blood pressure checked every year. You should have your blood pressure measured twice-once when you are at a hospital or clinic, and once when you are not at a hospital or clinic. Record the average of the two measurements. To check your blood pressure when you are not at a hospital or clinic, you can use: ? An automated blood pressure machine at a pharmacy. ? A home blood pressure monitor.  If  you are between 74 years and 26 years old, ask your health care provider if you should take aspirin to prevent strokes.  Have regular diabetes screenings. This involves taking a blood sample to check your fasting blood sugar level. ? If you are at a normal weight and have a low risk for diabetes, have this test once every three years after 69 years of age. ? If you are overweight and have a high risk for diabetes, consider being tested at a younger age or more often. Preventing infection Hepatitis B  If you have a higher risk for hepatitis B, you should be screened for this virus. You are considered at high risk for hepatitis B if: ? You were born in a country where hepatitis B is common. Ask your health care provider which countries are considered high risk. ? Your parents were born in a high-risk country, and you have not been immunized against hepatitis B (hepatitis B vaccine). ? You have HIV or AIDS. ? You use needles to inject street drugs. ? You live with someone who has hepatitis B. ? You have had sex with someone who has hepatitis B. ? You get hemodialysis treatment. ? You take certain medicines for conditions, including cancer, organ transplantation, and autoimmune conditions.  Hepatitis C  Blood testing is recommended for: ? Everyone born from [birth year] through 1963. ? Anyone with known risk factors for hepatitis C.  Sexually transmitted infections (STIs)  You should be screened for sexually transmitted infections (STIs) including gonorrhea and chlamydia if: ? You are sexually active and are younger than 69 years of age. ? You are older than 69 years of age and your health care provider tells you that you are at risk for this type of infection. ? Your sexual activity has changed since you were last screened and you are at an increased risk for chlamydia or gonorrhea. Ask your health care provider if you are at risk.  If you do not have HIV, but are at risk, it may be recommended  that you take a prescription medicine daily to prevent HIV infection. This is called pre-exposure prophylaxis (PrEP). You are considered at risk if: ? You are sexually active and do not regularly use condoms or know the HIV status of your partner(s). ? You take drugs by injection. ? You are sexually active with a partner who has HIV.  Talk with your health care provider about whether you are at high risk of being infected with HIV. If  you choose to begin PrEP, you should first be tested for HIV. You should then be tested every 3 months for as long as you are taking PrEP. Pregnancy  If you are premenopausal and you may become pregnant, ask your health care provider about preconception counseling.  If you may become pregnant, take 400 to 800 micrograms (mcg) of folic acid every day.  If you want to prevent pregnancy, talk to your health care provider about birth control (contraception). Osteoporosis and menopause  Osteoporosis is a disease in which the bones lose minerals and strength with aging. This can result in serious bone fractures. Your risk for osteoporosis can be identified using a bone density scan.  If you are 51 years of age or older, or if you are at risk for osteoporosis and fractures, ask your health care provider if you should be screened.  Ask your health care provider whether you should take a calcium or vitamin D supplement to lower your risk for osteoporosis.  Menopause may have certain physical symptoms and risks.  Hormone replacement therapy may reduce some of these symptoms and risks. Talk to your health care provider about whether hormone replacement therapy is right for you. Follow these instructions at home:  Schedule regular health, dental, and eye exams.  Stay current with your immunizations.  Do not use any tobacco products including cigarettes, chewing tobacco, or electronic cigarettes.  If you are pregnant, do not drink alcohol.  If you are  breastfeeding, limit how much and how often you drink alcohol.  Limit alcohol intake to no more than 1 drink per day for nonpregnant women. One drink equals 12 ounces of beer, 5 ounces of wine, or 1 ounces of hard liquor.  Do not use street drugs.  Do not share needles.  Ask your health care provider for help if you need support or information about quitting drugs.  Tell your health care provider if you often feel depressed.  Tell your health care provider if you have ever been abused or do not feel safe at home. This information is not intended to replace advice given to you by your health care provider. Make sure you discuss any questions you have with your health care provider. Document Released: 01/31/2011 Document Revised: 12/24/2015 Document Reviewed: 04/21/2015 Elsevier Interactive Patient Education  [employer].

## 2017-04-05 NOTE — Progress Notes (Signed)
BP 108/62   Pulse 87   Temp 97.9 F (36.6 C) (Oral)   Ht 4\' 10"  (1.473 m)   Wt 120 lb (54.4 kg)   SpO2 98%   BMI 25.08 kg/m    CC: CPE  Subjective:    Patient ID: Courtney Thomas, female    DOB: 26-Aug-1945, 71 y.o.   MRN: [id number]  HPI: Courtney Thomas is a 71 y.o. female presenting on 04/05/2017 for Annual Exam (Medicare pt2)   Courtney Thomas last week for medicare wellness visit. Note reviewed.  Failed hearing L ear. Pt denies trouble.   Tolerating fosamax without jaw or hip pain. This was started around 03/2014.  Ambien PRN sleep - uses about once every 2 wks.   Seeing new partner - he is also widower.   Preventative: Colonoscopy - 07/2013 WNL 10 yr f/u ([place]) Well woman - with GYN, states pap smear 11-21-2013 WNL ([employer]ney Thomas in [place] - to retire). S/p hysterectomy and BSO.  Mammogram at [facility] 03/2017 WNL. She does breast exams at home.  DEXA 05/2016 T -3.5 --> 3.3 spine, -2.0 --> -1.7 hip, on fosamax since prior to 03/2014  Egg free flu shot yearly.  Prevnar 11/22/15. Pneumovax today.  Tdap 11-22-11 with bad reaction  shingrix - discussed Advanced directives: has at home. HCPOA is daughter Courtney Thomas. To bring me copy of living will. Seat Thomas use discussed Sunscreen use discussed. No changing moles on skin. Non smoker Alcohol - none  Lives alone - husband passed away 2013-11-21. LBD + parkinson disease Started seeing new partner 21-Nov-2016.  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 Interim medical history since our last visit reviewed. Allergies and medications reviewed and updated. Outpatient Medications Prior to Visit  Medication Sig Dispense Refill  . Calcium Carbonate-Vitamin D (CALCIUM 600/VITAMIN D) 600-400 MG-UNIT chew tablet Chew 1 tablet by mouth daily.    . cetirizine (ZYRTEC) 10 MG  tablet Take 1 tablet (10 mg total) by mouth daily. 90 tablet 3  . ibuprofen (ADVIL,MOTRIN) 400 MG tablet Take 1.5 tablets (600 mg total) by mouth every 6 (six) hours as needed for moderate pain. (Patient taking differently: Take 200 mg by mouth every 6 (six) hours as needed for moderate pain. ) 20 tablet 0  . naproxen sodium (ANAPROX) 220 MG tablet Take 220 mg by mouth every 4 (four) hours as needed. For arthritis.    Marland Kitchen zolpidem (AMBIEN) 10 MG tablet TAKE ONE TABLET BY MOUTH AT BEDTIME 30 tablet 3  . alendronate (FOSAMAX) 70 MG tablet Take 1 tablet (70 mg total) by mouth every 7 (seven) days. Take with a full glass of water on an empty stomach. 12 tablet 3  . atorvastatin (LIPITOR) 10 MG tablet TAKE ONE (1) TABLET EACH DAY 90 tablet 0  . buPROPion (WELLBUTRIN SR) 100 MG 12 hr tablet Take 1 tablet (100 mg total) by mouth daily. 30 tablet 11  . DULoxetine (CYMBALTA) 60 MG capsule Take 1 capsule (60 mg total) by mouth daily. 30 capsule 11  . lisinopril-hydrochlorothiazide (PRINZIDE,ZESTORETIC) 10-12.5 MG tablet Take 1 tablet by mouth daily. 90 tablet 3  . Vitamin D, Ergocalciferol, (DRISDOL) 50000 UNITS CAPS capsule Take 1 capsule (50,000 Units total) by mouth every 7 (seven) days. 12 capsule 3   No facility-administered medications prior to visit.  Per HPI unless specifically indicated in ROS section below Review of Systems  Constitutional: Negative for activity change, appetite change, chills, fatigue, fever and unexpected weight change.  HENT: Negative for hearing loss.   Eyes: Negative for visual disturbance.  Respiratory: Negative for cough, chest tightness, shortness of breath and wheezing.   Cardiovascular: Negative for chest pain, palpitations and leg swelling.  Gastrointestinal: Negative for abdominal distention, abdominal pain, blood in stool, constipation, diarrhea, nausea and vomiting.  Genitourinary: Negative for difficulty urinating and hematuria.  Musculoskeletal: Negative for  arthralgias, myalgias and neck pain.  Skin: Negative for rash.  Neurological: Negative for dizziness, seizures, syncope and headaches.  Hematological: Negative for adenopathy. Does not bruise/bleed easily.  Psychiatric/Behavioral: Negative for dysphoric mood. The patient is not nervous/anxious.        Objective:    BP 108/62   Pulse 87   Temp 97.9 F (36.6 C) (Oral)   Ht 4\' 10"  (1.473 m)   Wt 120 lb (54.4 kg)   SpO2 98%   BMI 25.08 kg/m   Wt Readings from Last 3 Encounters:  04/05/17 120 lb (54.4 kg)  03/27/17 120 lb 8 oz (54.7 kg)  01/17/17 129 lb 12 oz (58.9 kg)    Ht Readings from Last 3 Encounters:  04/05/17 4\' 10"  (1.473 m)  03/27/17 4' 10.25" (1.48 m)  01/17/17 4' 10.5" (1.486 m)    BP Readings from Last 3 Encounters:  04/05/17 108/62  03/27/17 102/64  01/17/17 106/72    Physical Exam  Constitutional: She is oriented to person, place, and time. She appears well-developed and well-nourished. No distress.  HENT:  Head: Normocephalic and atraumatic.  Right Ear: Hearing, tympanic membrane, external ear and ear canal normal.  Left Ear: Hearing, tympanic membrane, external ear and ear canal normal.  Nose: Nose normal.  Mouth/Throat: Uvula is midline, oropharynx is clear and moist and mucous membranes are normal. No oropharyngeal exudate, posterior oropharyngeal edema or posterior oropharyngeal erythema.  Eyes: Pupils are equal, round, and reactive to light. Conjunctivae and EOM are normal. No scleral icterus.  Neck: Normal range of motion. Neck supple. No thyromegaly present.  Cardiovascular: Normal rate, regular rhythm, normal heart sounds and intact distal pulses.   No murmur heard. Pulses:      Radial pulses are 2+ on the right side, and 2+ on the left side.  Pulmonary/Chest: Effort normal and breath sounds normal. No respiratory distress. She has no wheezes. She has no rales.  Abdominal: Soft. Bowel sounds are normal. She exhibits no distension and no mass. There  is no tenderness. There is no rebound and no guarding.  Musculoskeletal: Normal range of motion. She exhibits no edema.  Lymphadenopathy:    She has no cervical adenopathy.  Neurological: She is alert and oriented to person, place, and time.  CN grossly intact, station and gait intact  Skin: Skin is warm and dry. No rash noted.  Psychiatric: She has a normal mood and affect. Her behavior is normal. Judgment and thought content normal.  Nursing note and vitals reviewed.  Results for orders placed or performed in visit on 03/27/17  Lipid panel  Result Value Ref Range   Cholesterol 160 0 - 200 mg/dL   Triglycerides 65.0 0.0 - 149.0 mg/dL   HDL 67.90 >39.00 mg/dL   VLDL 13.0 0.0 - 40.0 mg/dL   LDL Cholesterol 79 0 - 99 mg/dL   Total CHOL/HDL Ratio 2    NonHDL 81.01   Basic metabolic panel  Result Value Ref  Range   Sodium 138 135 - 145 mEq/L   Potassium 3.7 3.5 - 5.1 mEq/L   Chloride 102 96 - 112 mEq/L   CO2 32 19 - 32 mEq/L   Glucose, Bld 97 70 - 99 mg/dL   BUN 16 6 - 23 mg/dL   Creatinine, Ser 0.82 0.40 - 1.20 mg/dL   Calcium 9.6 8.4 - 10.5 mg/dL   GFR 73.34 >60.00 mL/min  CBC with Differential/Platelet  Result Value Ref Range   WBC 5.8 4.0 - 10.5 K/uL   RBC 4.19 3.87 - 5.11 Mil/uL   Hemoglobin 12.3 12.0 - 15.0 g/dL   HCT 37.5 36.0 - 46.0 %   MCV 89.6 78.0 - 100.0 fl   MCHC 32.8 30.0 - 36.0 g/dL   RDW 13.8 11.5 - 15.5 %   Platelets 243.0 150.0 - 400.0 K/uL   Neutrophils Relative % 59.5 43.0 - 77.0 %   Lymphocytes Relative 28.0 12.0 - 46.0 %   Monocytes Relative 7.9 3.0 - 12.0 %   Eosinophils Relative 3.6 0.0 - 5.0 %   Basophils Relative 1.0 0.0 - 3.0 %   Neutro Abs 3.5 1.4 - 7.7 K/uL   Lymphs Abs 1.6 0.7 - 4.0 K/uL   Monocytes Absolute 0.5 0.1 - 1.0 K/uL   Eosinophils Absolute 0.2 0.0 - 0.7 K/uL   Basophils Absolute 0.1 0.0 - 0.1 K/uL  Vitamin B12  Result Value Ref Range   Vitamin B-12 215 211 - 911 pg/mL  VITAMIN D 25 Hydroxy (Vit-D Deficiency, Fractures)  Result  Value Ref Range   VITD 24.16 (L) 30.00 - 100.00 ng/mL      Assessment & Plan:   Problem List Items Addressed This Visit    Advanced care planning/counseling discussion    Advanced directives: has at home. HCPOA is daughter Courtney Thomas. To bring me copy of living will.      B12 deficiency    b12 shot today. rec monthly b12 shots then return in 6 months to recheck levels. Endorses intermittent fatigue.       Health care maintenance - Primary    Preventative protocols reviewed and updated unless pt declined. Discussed healthy diet and lifestyle.       History of allergy to eggs    We don't have egg-free flu-bloc yet. She will return for flu clinic for this.       Hyperlipidemia    Chronic, stable. Continue current regimen.  The 10-year ASCVD risk score Mikey Bussing DC Brooke Bonito., et al., 2013) is: 7.2%   Values used to calculate the score:     Age: 87 years     Sex: Female     Is Non-Hispanic African American: No     Diabetic: No     Tobacco smoker: No     Systolic Blood Pressure: 601 mmHg     Is BP treated: Yes     HDL Cholesterol: 67.9 mg/dL     Total Cholesterol: 160 mg/dL       Relevant Medications   atorvastatin (LIPITOR) 10 MG tablet   lisinopril-hydrochlorothiazide (PRINZIDE,ZESTORETIC) 10-12.5 MG tablet   Hypertension    Chronic, stable. Continue lower lisinopril hctz dose.       Relevant Medications   atorvastatin (LIPITOR) 10 MG tablet   lisinopril-hydrochlorothiazide (PRINZIDE,ZESTORETIC) 10-12.5 MG tablet   Insomnia    Continue ambien PRN.       Osteoporosis    Stable to slightly improved DEXA 05/2016, reviewed with patient.  Fosamax started around 03/2014. rec continue for  full 5 yrs as long as tolerated well and no upcoming dental work, then reassess.       Relevant Medications   alendronate (FOSAMAX) 70 MG tablet   Vitamin D, Ergocalciferol, (DRISDOL) 50000 units CAPS capsule   Vitamin D deficiency    She has been taking 5000 units once weekly. Advised to  restart 50,000 IU weekly - Rx refilled.           Follow up plan: Return in about 1 year (around 04/05/2018) for annual exam, prior fasting for blood work.  Ria Bush, MD

## 2017-04-05 NOTE — Assessment & Plan Note (Signed)
We don't have egg-free flu-bloc yet. She will return for flu clinic for this.

## 2017-04-05 NOTE — Assessment & Plan Note (Signed)
b12 shot today. rec monthly b12 shots then return in 6 months to recheck levels. Endorses intermittent fatigue.

## 2017-04-05 NOTE — Assessment & Plan Note (Signed)
Chronic, stable. Continue current regimen.  The 10-year ASCVD risk score Mikey Bussing DC Brooke Bonito., et al., 2013) is: 7.2%   Values used to calculate the score:     Age: 71 years     Sex: Female     Is Non-Hispanic African American: No     Diabetic: No     Tobacco smoker: No     Systolic Blood Pressure: 004 mmHg     Is BP treated: Yes     HDL Cholesterol: 67.9 mg/dL     Total Cholesterol: 160 mg/dL

## 2017-04-05 NOTE — Addendum Note (Signed)
Addended by: Modena Nunnery on: 04/05/2017 05:09 PM   Modules accepted: Orders

## 2017-04-05 NOTE — Assessment & Plan Note (Signed)
She has been taking 5000 units once weekly. Advised to restart 50,000 IU weekly - Rx refilled.

## 2017-04-06 ENCOUNTER — Encounter: Payer: Medicare HMO | Admitting: Family Medicine

## 2017-04-27 ENCOUNTER — Other Ambulatory Visit: Payer: Self-pay | Admitting: Family Medicine

## 2017-04-28 NOTE — Telephone Encounter (Signed)
Last filled:  03/28/17, #30 Last OV (CPE):  04/05/17 Next OV:  04/10/18

## 2017-04-28 NOTE — Telephone Encounter (Signed)
Phoned in rx refill to pharmacy.

## 2017-04-28 NOTE — Telephone Encounter (Signed)
plz phone in. 

## 2017-05-05 ENCOUNTER — Telehealth: Payer: Self-pay | Admitting: Family Medicine

## 2017-05-05 NOTE — Telephone Encounter (Signed)
Patient called to schedule for her flu shot.  She needs the egg free vaccine.  Can you save one for her nurse visit appointment on 05/09/17?

## 2017-05-05 NOTE — Telephone Encounter (Signed)
I will make sure her name is on 1. I am sure you noted that in the appt note.

## 2017-05-09 ENCOUNTER — Ambulatory Visit (INDEPENDENT_AMBULATORY_CARE_PROVIDER_SITE_OTHER): Payer: Medicare HMO

## 2017-05-09 DIAGNOSIS — Z23 Encounter for immunization: Secondary | ICD-10-CM

## 2017-05-09 DIAGNOSIS — E538 Deficiency of other specified B group vitamins: Secondary | ICD-10-CM

## 2017-05-09 MED ORDER — CYANOCOBALAMIN 1000 MCG/ML IJ SOLN
1000.0000 ug | Freq: Once | INTRAMUSCULAR | Status: AC
  Administered 2017-05-09: 1000 ug via INTRAMUSCULAR

## 2017-06-13 ENCOUNTER — Ambulatory Visit (INDEPENDENT_AMBULATORY_CARE_PROVIDER_SITE_OTHER): Payer: Medicare HMO

## 2017-06-13 DIAGNOSIS — E538 Deficiency of other specified B group vitamins: Secondary | ICD-10-CM | POA: Diagnosis not present

## 2017-06-13 MED ORDER — CYANOCOBALAMIN 1000 MCG/ML IJ SOLN
1000.0000 ug | Freq: Once | INTRAMUSCULAR | Status: AC
  Administered 2017-06-13: 1000 ug via INTRAMUSCULAR

## 2017-07-18 ENCOUNTER — Ambulatory Visit (INDEPENDENT_AMBULATORY_CARE_PROVIDER_SITE_OTHER): Payer: Medicare HMO

## 2017-07-18 DIAGNOSIS — E538 Deficiency of other specified B group vitamins: Secondary | ICD-10-CM

## 2017-07-18 MED ORDER — CYANOCOBALAMIN 1000 MCG/ML IJ SOLN
1000.0000 ug | Freq: Once | INTRAMUSCULAR | Status: AC
  Administered 2017-07-18: 1000 ug via INTRAMUSCULAR

## 2017-08-22 ENCOUNTER — Ambulatory Visit (INDEPENDENT_AMBULATORY_CARE_PROVIDER_SITE_OTHER): Payer: Medicare HMO

## 2017-08-22 DIAGNOSIS — E538 Deficiency of other specified B group vitamins: Secondary | ICD-10-CM

## 2017-08-22 MED ORDER — CYANOCOBALAMIN 1000 MCG/ML IJ SOLN
1000.0000 ug | Freq: Once | INTRAMUSCULAR | Status: AC
  Administered 2017-08-22: 1000 ug via INTRAMUSCULAR

## 2017-08-24 ENCOUNTER — Other Ambulatory Visit: Payer: Self-pay | Admitting: Family Medicine

## 2017-08-25 NOTE — Telephone Encounter (Signed)
plz phone in. 

## 2017-08-25 NOTE — Telephone Encounter (Signed)
Last filled:  07/26/17, #30 Last OV (CPE):  04/05/17 Next OV:  04/10/18

## 2017-08-25 NOTE — Telephone Encounter (Signed)
Called in refill to [facility] per Dr. Darnell Level.

## 2017-09-02 DIAGNOSIS — B9689 Other specified bacterial agents as the cause of diseases classified elsewhere: Secondary | ICD-10-CM | POA: Diagnosis not present

## 2017-09-02 DIAGNOSIS — J019 Acute sinusitis, unspecified: Secondary | ICD-10-CM | POA: Diagnosis not present

## 2017-09-02 DIAGNOSIS — R6889 Other general symptoms and signs: Secondary | ICD-10-CM | POA: Diagnosis not present

## 2017-09-02 DIAGNOSIS — J209 Acute bronchitis, unspecified: Secondary | ICD-10-CM | POA: Diagnosis not present

## 2017-09-17 ENCOUNTER — Other Ambulatory Visit: Payer: Self-pay | Admitting: Family Medicine

## 2017-09-17 DIAGNOSIS — E559 Vitamin D deficiency, unspecified: Secondary | ICD-10-CM

## 2017-09-17 DIAGNOSIS — E538 Deficiency of other specified B group vitamins: Secondary | ICD-10-CM

## 2017-09-18 ENCOUNTER — Other Ambulatory Visit: Payer: Self-pay

## 2017-09-18 ENCOUNTER — Ambulatory Visit: Payer: Medicare HMO | Admitting: Family Medicine

## 2017-09-18 ENCOUNTER — Other Ambulatory Visit (INDEPENDENT_AMBULATORY_CARE_PROVIDER_SITE_OTHER): Payer: Medicare HMO

## 2017-09-18 ENCOUNTER — Encounter: Payer: Self-pay | Admitting: Family Medicine

## 2017-09-18 VITALS — BP 90/60 | HR 99 | Temp 98.7°F | Ht <= 58 in | Wt 123.2 lb

## 2017-09-18 DIAGNOSIS — E538 Deficiency of other specified B group vitamins: Secondary | ICD-10-CM

## 2017-09-18 DIAGNOSIS — N3000 Acute cystitis without hematuria: Secondary | ICD-10-CM | POA: Diagnosis not present

## 2017-09-18 DIAGNOSIS — R3915 Urgency of urination: Secondary | ICD-10-CM

## 2017-09-18 DIAGNOSIS — E559 Vitamin D deficiency, unspecified: Secondary | ICD-10-CM | POA: Diagnosis not present

## 2017-09-18 LAB — POC URINALSYSI DIPSTICK (AUTOMATED)
Bilirubin, UA: NEGATIVE
Glucose, UA: NEGATIVE
Ketones, UA: NEGATIVE
NITRITE UA: NEGATIVE
SPEC GRAV UA: 1.025 (ref 1.010–1.025)
UROBILINOGEN UA: 0.2 U/dL
pH, UA: 6 (ref 5.0–8.0)

## 2017-09-18 LAB — VITAMIN D 25 HYDROXY (VIT D DEFICIENCY, FRACTURES): VITD: 21.18 ng/mL — AB (ref 30.00–100.00)

## 2017-09-18 LAB — VITAMIN B12: VITAMIN B 12: 352 pg/mL (ref 211–911)

## 2017-09-18 MED ORDER — SULFAMETHOXAZOLE-TRIMETHOPRIM 800-160 MG PO TABS: 1.0000 | ORAL_TABLET | Freq: Two times a day (BID) | ORAL | 0 refills | Status: AC

## 2017-09-18 NOTE — Progress Notes (Signed)
Dr. Frederico Hamman T. Curry Dulski, MD, [facility] [place], [address] Phone: [phone] Fax: [phone]  09/18/2017  Patient: Courtney Thomas, MRN: [id number], DOB: [birth year], 72 y.o.  Primary Physician:  Ria Bush, MD   Chief Complaint  Patient presents with  . Urinary Urgency   Subjective:   This 72 y.o. female patient presents with burning, urgency. No vaginal discharge or external irritation.  No STD exposure. No abd pain, no flank pain.  The PMH, PSH, Social History, Family History, Medications, and allergies have been reviewed in [facility], and have been updated if relevant.  Patient Active Problem List   Diagnosis Date Noted  . Trapezius strain 08/24/2016  . History of allergy to eggs 04/01/2016  . Tremor 12/08/2015  . Low back pain radiating to left lower extremity 08/06/2015  . Advanced care planning/counseling discussion 03/17/2015  . Health care maintenance 03/17/2015  . Osteoarthritis of lumbar spine 09/08/2014  . Medicare annual wellness visit, subsequent 02/21/2014  . Osteoporosis   . Vitamin D deficiency 02/13/2014  . B12 deficiency   . Hypertension   . Hyperlipidemia   . Insomnia   . Seasonal allergic rhinitis   . Adjustment disorder with depressed mood     Past Medical History:  Diagnosis Date  . Adjustment disorder with depressed mood   . Asthma   . B12 deficiency anemia 09-13-12   mild  . Frequent headaches   . History of chicken pox   . Hyperlipidemia   . Hypertension   . Insomnia   . Osteoporosis    latest dexa 05/2016 T -3.3 spine (unchanged), -2.0 hip - on fosamax since 13-Sep-2013  . Seasonal allergic rhinitis     Past Surgical History:  Procedure Laterality Date  . BREAST CYST ASPIRATION    . BREAST EXCISIONAL BIOPSY Right 1971   neg  . COLONOSCOPY  07/2013   WNL per patient ([place])  . dexa  03/2014   T score spine -3.5, hip -2.0  . NASAL SINUS SURGERY     deviated septum  .  TONSILLECTOMY  1970  . TOTAL ABDOMINAL HYSTERECTOMY W/ BILATERAL SALPINGOOPHORECTOMY  1994   uterine cyst with heavy bleeding    Social History   Socioeconomic History  . Marital status: Married    Spouse name: Not on file  . Number of children: Not on file  . Years of education: Not on file  . Highest education level: Not on file  Social Needs  . Financial resource strain: Not on file  . Food insecurity - worry: Not on file  . Food insecurity - inability: Not on file  . Transportation needs - medical: Not on file  . Transportation needs - non-medical: Not on file  Occupational History  . Not on file  Tobacco Use  . Smoking status: Former Research scientist (life sciences)  . Smokeless tobacco: Never Used  . Tobacco comment: minimal smoking as a teenager  Substance and Sexual Activity  . Alcohol use: No  . Drug use: No  . Sexual activity: No  Other Topics Concern  . Not on file  Social History Narrative   Lives alone - husband passed away 09-13-13. 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 Cancer Mother 24  lung met to brain, smoker  . Stroke Mother   . Alzheimer's disease Mother   . Arthritis Father   . Arthritis Sister   . Cancer Brother 18       pancreatic  . Diabetes Maternal Grandmother   . Breast cancer Neg Hx     Allergies  Allergen Reactions  . Eggs Or Egg-Derived Products Anaphylaxis and Swelling  . Codeine Nausea Only    Medication list reviewed and updated in full in [place].  GEN:  no fevers, chills. GI: No n/v/d, eating normally Otherwise, ROS is as per the HPI.  Objective:   Blood pressure 90/60, pulse 99, temperature 98.7 F (37.1 C), temperature source Oral, height _0  (1.473 m), weight 123 lb 4 oz (55.9 kg).  GEN: WDWN, A&Ox4,NAD. Non-toxic HEENT: Atraumatc,  normocephalic. CV: RRR, No M/G/R PULM: CTA B, No wheezes, crackles, or rhonchi ABD: S, NT, ND, +BS, no rebound. No CVAT. No suprapubic tenderness. EXT: No c/c/e  Objective Data: Results for orders placed or performed in visit on 09/18/17  POCT Urinalysis Dipstick (Automated)  Result Value Ref Range   Color, UA yellow    Clarity, UA hazy    Glucose, UA negative    Bilirubin, UA negative    Ketones, UA negative    Spec Grav, UA 1.025 1.010 - 1.025   Blood, UA trace    pH, UA 6.0 5.0 - 8.0   Protein, UA 1+    Urobilinogen, UA 0.2 0.2 or 1.0 E.U./dL   Nitrite, UA negative    Leukocytes, UA Moderate (2+) (A) Negative    Assessment and Plan:   Acute cystitis without hematuria  Urinary urgency - Plan: POCT Urinalysis Dipstick (Automated), Urine Culture  Rx with ABX as below. Drink plenty of fluids and supportive care.  Follow-up: No Follow-up on file.  Meds ordered this encounter  Medications  . sulfamethoxazole-trimethoprim (BACTRIM DS,SEPTRA DS) 800-160 MG tablet    Sig: Take 1 tablet by mouth 2 (two) times daily for 7 days.    Dispense:  14 tablet    Refill:  0   Orders Placed This Encounter  Procedures  . Urine Culture  . POCT Urinalysis Dipstick (Automated)    Signed,  Karol Skarzynski T. Kamora Vossler, MD   Patient's Medications  New Prescriptions   SULFAMETHOXAZOLE-TRIMETHOPRIM (BACTRIM DS,SEPTRA DS) 800-160 MG TABLET    Take 1 tablet by mouth 2 (two) times daily for 7 days.  Previous Medications   ALENDRONATE (FOSAMAX) 70 MG TABLET    Take 1 tablet (70 mg total) by mouth every 7 (seven) days. Take with a full glass of water on an empty stomach.   ATORVASTATIN (LIPITOR) 10 MG TABLET    Take one tablet oral daily   BUPROPION (WELLBUTRIN SR) 100 MG 12 HR TABLET    Take 1 tablet (100 mg total) by mouth daily.   CALCIUM CARBONATE-VITAMIN D (CALCIUM 600/VITAMIN D) 600-400 MG-UNIT CHEW TABLET    Chew 1 tablet by mouth daily.   CETIRIZINE (ZYRTEC) 10 MG TABLET    Take 1 tablet  (10 mg total) by mouth daily.   CYANOCOBALAMIN (,VITAMIN B-12,) 1000 MCG/ML INJECTION    Inject 1,000 mcg into the muscle every 30 (thirty) days.   DULOXETINE (CYMBALTA) 60 MG CAPSULE    Take 1 capsule (60 mg total) by mouth daily.   IBUPROFEN (ADVIL,MOTRIN) 400 MG TABLET    Take 1.5 tablets (600 mg total) by mouth every 6 (six) hours as needed for moderate pain.  LISINOPRIL-HYDROCHLOROTHIAZIDE (PRINZIDE,ZESTORETIC) 10-12.5 MG TABLET    Take 1 tablet by mouth daily.   NAPROXEN SODIUM (ANAPROX) 220 MG TABLET    Take 220 mg by mouth every 4 (four) hours as needed. For arthritis.   VITAMIN D, ERGOCALCIFEROL, (DRISDOL) 50000 UNITS CAPS CAPSULE    Take 1 capsule (50,000 Units total) by mouth every 7 (seven) days.   ZOLPIDEM (AMBIEN) 10 MG TABLET    TAKE ONE TABLET BY MOUTH AT BEDTIME  Modified Medications   No medications on file  Discontinued Medications   No medications on file

## 2017-09-20 ENCOUNTER — Other Ambulatory Visit: Payer: Self-pay | Admitting: Family Medicine

## 2017-09-20 MED ORDER — VITAMIN B-12 1000 MCG SL SUBL: 1.0000 | SUBLINGUAL_TABLET | Freq: Every day | SUBLINGUAL | 0 refills | Status: DC

## 2017-09-21 LAB — URINE CULTURE
MICRO NUMBER:: 90212550
SPECIMEN QUALITY: ADEQUATE

## 2017-10-16 DIAGNOSIS — H524 Presbyopia: Secondary | ICD-10-CM | POA: Diagnosis not present

## 2017-11-18 ENCOUNTER — Other Ambulatory Visit: Payer: Self-pay | Admitting: Family Medicine

## 2017-11-20 ENCOUNTER — Other Ambulatory Visit: Payer: Self-pay | Admitting: Family Medicine

## 2017-11-20 NOTE — Telephone Encounter (Signed)
Copied from [place] [id number] [id number]. Topic: Quick Communication - Rx Refill/Question >> Nov 20, 2017 12:25 PM Oliver Pila B wrote: Medication: zolpidem (AMBIEN) 10 MG tablet [[id number]]  Has the patient contacted their pharmacy? Yes.   (Agent: If no, request that the patient contact the pharmacy for the refill.) Preferred Pharmacy (with phone number or street name): [email or web address] Agent: Please be advised that RX refills may take up to 3 business days. We ask that you follow-up with your pharmacy.

## 2017-11-20 NOTE — Telephone Encounter (Signed)
Eprescribed.

## 2017-11-20 NOTE — Telephone Encounter (Signed)
Refill  Request Ambien  LOV 09/18/2017  Pharmacy on File

## 2017-11-20 NOTE — Telephone Encounter (Signed)
Requesting refill ambien to [facility]; last refilled # 30 x 2 on 08/25/17. Last annual 04/05/17.

## 2017-11-20 NOTE — Telephone Encounter (Signed)
Electronic refill request Last office visit 09/18/17-acute Last refill 08/25/17 #30/2

## 2018-01-19 ENCOUNTER — Other Ambulatory Visit: Payer: Self-pay | Admitting: Family Medicine

## 2018-01-19 NOTE — Telephone Encounter (Signed)
Eprescribed.

## 2018-01-19 NOTE — Telephone Encounter (Signed)
Last filled:  12/22/17, #30 Last OV (CPE):  04/05/17 Next OV:A  none

## 2018-01-26 ENCOUNTER — Telehealth: Payer: Self-pay | Admitting: Family Medicine

## 2018-01-26 MED ORDER — LORAZEPAM 0.5 MG PO TABS: 0.2500 mg | ORAL_TABLET | Freq: Two times a day (BID) | ORAL | 0 refills | Status: DC | PRN

## 2018-01-26 NOTE — Telephone Encounter (Signed)
I spoke with pt; pt is going thru a stressful time with her daughter.pt presently taking wellbutrin SR 100 mg daily and cymbalta 60 mg daily. Pt wants to change to different anxiety meds. Pt feels wired, pts daughter was engaged and had to go to behavioral health and when was discharged moved in with a different man. Pt is dealing with her daughter and the daughters fiancee and she needs something more for her anxiety. Courtney Thomas. No SI/HI. Pt last saw Dr Darnell Level on 04/05/17 for annual exam.Please advise.

## 2018-01-26 NOTE — Telephone Encounter (Signed)
Copied from [place] [id number]. Topic: General - Other >> Jan 26, 2018 10:14 AM Lennox Solders wrote: Reason for CRM: pt is calling her anxiety med is not working and pt would kim or dr g nurse  to return her call

## 2018-01-26 NOTE — Telephone Encounter (Signed)
Spoke with patient.  rec office visit to discuss cross taper/titration of medications.  in interim, may try lorazepam 1/2-1 tab 0.5mg  BID PRN anxiety/stress. Discussed habit forming nature of medication, to take sparingly.

## 2018-02-02 ENCOUNTER — Other Ambulatory Visit: Payer: Self-pay

## 2018-02-02 ENCOUNTER — Other Ambulatory Visit: Payer: Self-pay | Admitting: Family Medicine

## 2018-02-02 DIAGNOSIS — Z1231 Encounter for screening mammogram for malignant neoplasm of breast: Secondary | ICD-10-CM

## 2018-02-19 ENCOUNTER — Other Ambulatory Visit: Payer: Self-pay | Admitting: Family Medicine

## 2018-02-20 NOTE — Telephone Encounter (Signed)
Pharmacy requests refill on zolpidem.  Last OV (Annual): 04/05/17  Next OV: 03/05/18 Last refill: #30 on 01/19/18  Will pend for Dr. Damita Dunnings to review and advise.

## 2018-02-20 NOTE — Telephone Encounter (Signed)
Sent. Thanks.   

## 2018-03-05 ENCOUNTER — Ambulatory Visit: Payer: Medicare HMO | Admitting: Family Medicine

## 2018-03-07 ENCOUNTER — Ambulatory Visit: Payer: Medicare HMO | Admitting: Family Medicine

## 2018-03-07 DIAGNOSIS — Z0289 Encounter for other administrative examinations: Secondary | ICD-10-CM

## 2018-03-13 ENCOUNTER — Encounter: Payer: Self-pay | Admitting: Family Medicine

## 2018-03-13 ENCOUNTER — Ambulatory Visit (INDEPENDENT_AMBULATORY_CARE_PROVIDER_SITE_OTHER)
Admission: RE | Admit: 2018-03-13 | Discharge: 2018-03-13 | Disposition: A | Discharge status: Home / Self Care | Payer: Medicare HMO | Source: Ambulatory Visit | Attending: Family Medicine | Admitting: Family Medicine

## 2018-03-13 ENCOUNTER — Ambulatory Visit
Admission: RE | Admit: 2018-03-13 | Discharge: 2018-03-13 | Disposition: A | Discharge status: Home / Self Care | Payer: Medicare HMO | Source: Ambulatory Visit | Attending: Family Medicine | Admitting: Family Medicine

## 2018-03-13 ENCOUNTER — Telehealth: Payer: Self-pay

## 2018-03-13 ENCOUNTER — Ambulatory Visit (INDEPENDENT_AMBULATORY_CARE_PROVIDER_SITE_OTHER): Payer: Medicare HMO | Admitting: Family Medicine

## 2018-03-13 VITALS — BP 120/72 | HR 85 | Temp 98.4°F | Ht <= 58 in | Wt 129.0 lb

## 2018-03-13 DIAGNOSIS — R1011 Right upper quadrant pain: Secondary | ICD-10-CM

## 2018-03-13 DIAGNOSIS — R051 Acute cough: Secondary | ICD-10-CM | POA: Insufficient documentation

## 2018-03-13 DIAGNOSIS — R05 Cough: Secondary | ICD-10-CM

## 2018-03-13 DIAGNOSIS — R059 Cough, unspecified: Secondary | ICD-10-CM

## 2018-03-13 LAB — CBC WITH DIFFERENTIAL/PLATELET
BASOS ABS: 0.1 10*3/uL (ref 0.0–0.1)
Basophils Relative: 0.9 % (ref 0.0–3.0)
EOS ABS: 0.3 10*3/uL (ref 0.0–0.7)
EOS PCT: 4.6 % (ref 0.0–5.0)
HCT: 34.3 % — ABNORMAL LOW (ref 36.0–46.0)
HEMOGLOBIN: 11.4 g/dL — AB (ref 12.0–15.0)
Lymphocytes Relative: 22.1 % (ref 12.0–46.0)
Lymphs Abs: 1.3 10*3/uL (ref 0.7–4.0)
MCHC: 33.3 g/dL (ref 30.0–36.0)
MCV: 87.5 fl (ref 78.0–100.0)
MONO ABS: 0.6 10*3/uL (ref 0.1–1.0)
Monocytes Relative: 10 % (ref 3.0–12.0)
Neutro Abs: 3.8 10*3/uL (ref 1.4–7.7)
Neutrophils Relative %: 62.4 % (ref 43.0–77.0)
Platelets: 259 10*3/uL (ref 150.0–400.0)
RBC: 3.92 Mil/uL (ref 3.87–5.11)
RDW: 14.7 % (ref 11.5–15.5)
WBC: 6 10*3/uL (ref 4.0–10.5)

## 2018-03-13 LAB — COMPREHENSIVE METABOLIC PANEL
ALBUMIN: 4 g/dL (ref 3.5–5.2)
ALK PHOS: 85 U/L (ref 39–117)
ALT: 14 U/L (ref 0–35)
AST: 7 U/L (ref 0–37)
BUN: 19 mg/dL (ref 6–23)
CO2: 31 mEq/L (ref 19–32)
Calcium: 9.4 mg/dL (ref 8.4–10.5)
Chloride: 101 mEq/L (ref 96–112)
Creatinine, Ser: 0.77 mg/dL (ref 0.40–1.20)
GFR: 78.64 mL/min (ref >60.00)
Glucose, Bld: 74 mg/dL (ref 70–99)
Potassium: 3.4 mEq/L — ABNORMAL LOW (ref 3.5–5.1)
SODIUM: 139 meq/L (ref 135–145)
TOTAL PROTEIN: 6.8 g/dL (ref 6.0–8.3)
Total Bilirubin: 0.3 mg/dL (ref 0.2–1.2)

## 2018-03-13 LAB — LIPASE: LIPASE: 22 U/L (ref 11.0–59.0)

## 2018-03-13 NOTE — Assessment & Plan Note (Signed)
Pt endorses worsening cough over last few months - since increased stress with daughter. Exam with RLL rales. ?PNA as cause of R sided pain. Check CXR today.

## 2018-03-13 NOTE — Progress Notes (Addendum)
BP 120/72 (BP Location: Left Arm, Patient Position: Sitting, Cuff Size: Normal)   Pulse 85   Temp 98.4 F (36.9 C) (Oral)   Ht 4\' 10"  (1.473 m)   Wt 129 lb (58.5 kg)   SpO2 96%   BMI 26.96 kg/m    CC: abd pain Subjective:    Patient ID: Courtney Thomas, female    DOB: 1945-09-23, 72 y.o.   MRN: [id number]  HPI: Courtney Thomas is a 72 y.o. female presenting on 03/13/2018 for Abdominal Pain (C/o RUQ pain radiating around to the back. States pain is constant and sharp. Started 03/10/18.)   4d h/o R rib and RUQ pain with radiation to R flank. Sharp stabbing pain constant throughout the day. Worse pain with deep breath, coughing, laying on right side. Some bloating. Treating with icy hot, heating pad, pillow, aleve with mild benefit. Woke up Saturday with this pain.   No fevers/chills, nausea/vomiting, bowel changes, urinary changes.  Increase in dry cough noted since June. Mild dyspnea.   Denies inciting trauma/injury or falls. No new exercises, exertion, or heavy lifting.  Remote h/o kidney stones.   Patient taking whole tablet cymbalta, half tablet buproprion. Advised to take whole buproprion.   Relevant past medical, surgical, family and social history reviewed and updated as indicated. Interim medical history since our last visit reviewed. Allergies and medications reviewed and updated. Outpatient Medications Prior to Visit  Medication Sig Dispense Refill  . alendronate (FOSAMAX) 70 MG tablet Take 1 tablet (70 mg total) by mouth every 7 (seven) days. Take with a full glass of water on an empty stomach. 12 tablet 3  . atorvastatin (LIPITOR) 10 MG tablet Take one tablet oral daily 90 tablet 3  . budesonide-formoterol (SYMBICORT) 80-4.5 MCG/ACT inhaler Inhale 1 puff into the lungs 2 (two) times daily. 1 Inhaler   . buPROPion (WELLBUTRIN SR) 100 MG 12 hr tablet Take 1 tablet (100 mg total) by mouth daily. 90 tablet 3  . Calcium Carbonate-Vitamin D (CALCIUM 600/VITAMIN D) 600-400  MG-UNIT chew tablet Chew 1 tablet by mouth daily.    . cetirizine (ZYRTEC) 10 MG tablet Take 1 tablet (10 mg total) by mouth daily. 90 tablet 3  . cyanocobalamin (,VITAMIN B-12,) 1000 MCG/ML injection Inject 1,000 mcg into the muscle every 30 (thirty) days.    . Cyanocobalamin (VITAMIN B-12) 1000 MCG SUBL Place 1 tablet (1,000 mcg total) under the tongue daily.  0  . DULoxetine (CYMBALTA) 60 MG capsule Take 1 capsule (60 mg total) by mouth daily. 90 capsule 3  . ibuprofen (ADVIL,MOTRIN) 400 MG tablet Take 1.5 tablets (600 mg total) by mouth every 6 (six) hours as needed for moderate pain. (Patient taking differently: Take 200 mg by mouth every 6 (six) hours as needed for moderate pain. ) 20 tablet 0  . lisinopril-hydrochlorothiazide (PRINZIDE,ZESTORETIC) 10-12.5 MG tablet Take 1 tablet by mouth daily. 90 tablet 3  . naproxen sodium (ANAPROX) 220 MG tablet Take 220 mg by mouth every 4 (four) hours as needed. For arthritis.    . Vitamin D, Ergocalciferol, (DRISDOL) 50000 units CAPS capsule Take 1 capsule (50,000 Units total) by mouth every 7 (seven) days. 12 capsule 3  . zolpidem (AMBIEN) 10 MG tablet TAKE 1 TABLET BY MOUTH AT BEDTIME 30 tablet 0  . budesonide-formoterol (SYMBICORT) 80-4.5 MCG/ACT inhaler Inhale 2 puffs into the lungs 2 (two) times daily.    Marland Kitchen LORazepam (ATIVAN) 0.5 MG tablet Take 0.5-1 tablets (0.25-0.5 mg total) by mouth 2 (two)  times daily as needed for anxiety. 20 tablet 0   No facility-administered medications prior to visit.      Per HPI unless specifically indicated in ROS section below Review of Systems     Objective:    BP 120/72 (BP Location: Left Arm, Patient Position: Sitting, Cuff Size: Normal)   Pulse 85   Temp 98.4 F (36.9 C) (Oral)   Ht 4\' 10"  (1.473 m)   Wt 129 lb (58.5 kg)   SpO2 96%   BMI 26.96 kg/m   Wt Readings from Last 3 Encounters:  03/13/18 129 lb (58.5 kg)  09/18/17 123 lb 4 oz (55.9 kg)  04/05/17 120 lb (54.4 kg)    Physical Exam    Constitutional: She appears well-developed and well-nourished. No distress.  HENT:  Mouth/Throat: Oropharynx is clear and moist. No oropharyngeal exudate.  Eyes: Pupils are equal, round, and reactive to light. EOM are normal.  Cardiovascular: Normal rate, regular rhythm and normal heart sounds.  No murmur heard. Pulmonary/Chest: Effort normal. No respiratory distress. She has no wheezes. She has rales (RLL).    Abdominal: Normal appearance and bowel sounds are normal. She exhibits no distension and no mass. There is hepatomegaly. There is no splenomegaly. There is tenderness in the right upper quadrant. There is guarding (mild) and positive Murphy's sign. There is no rigidity, no rebound and no CVA tenderness. No hernia.  Musculoskeletal: She exhibits no edema.  Skin: Skin is warm and dry. There is erythema.  Erythematous blotch R upper lateral side below axilla, non tender or pruritic No vesicular rash  Psychiatric: She has a normal mood and affect.  Nursing note and vitals reviewed.  Results for orders placed or performed in visit on 09/18/17  VITAMIN D 25 Hydroxy (Vit-D Deficiency, Fractures)  Result Value Ref Range   VITD 21.18 (L) 30.00 - 100.00 ng/mL  Vitamin B12  Result Value Ref Range   Vitamin B-12 352 211 - 911 pg/mL      Assessment & Plan:   Problem List Items Addressed This Visit    RUQ abdominal pain - Primary    New, of 4d duration, marked pain with palpation at RUQ and possible hepatomegaly. No fever, nausea/vomiting, jaundice. ?liver or gallbladder disease, ?early shingles, ?RLL PNA.  Check CBC, CMP, lipase.  Check RUQ abd [place].  Check CXR.       Relevant Orders   DG Chest 2 View   CBC with Differential/Platelet   Comprehensive metabolic panel   Lipase   US Abdomen Limited RUQ   Cough    Pt endorses worsening cough over last few months - since increased stress with daughter. Exam with RLL rales. ?PNA as cause of R sided pain. Check CXR today.        Relevant Orders   DG Chest 2 View       No orders of the defined types were placed in this encounter.  Orders Placed This Encounter  Procedures  . DG Chest 2 View    Standing Status:   Future    Number of Occurrences:   1    Standing Expiration Date:   05/14/2019    Order Specific Question:   Reason for Exam (SYMPTOM  OR DIAGNOSIS REQUIRED)    Answer:   cough, RUQ pain, RLL rales    Order Specific Question:   Preferred imaging location?    Answer:   [facility]    Order Specific Question:   Radiology Contrast Protocol - do NOT remove  file path    Answer:   [email or web address]  . US Abdomen Limited RUQ    Standing Status:   Future    Standing Expiration Date:   05/14/2019    Order Specific Question:   Reason for Exam (SYMPTOM  OR DIAGNOSIS REQUIRED)    Answer:   RUQ pain, ?HM    Order Specific Question:   Preferred imaging location?    Answer:   [facility]  . CBC with Differential/Platelet  . Comprehensive metabolic panel  . Lipase    Follow up plan: Return if symptoms worsen or fail to improve.  Ria Bush, MD

## 2018-03-13 NOTE — Assessment & Plan Note (Addendum)
New, of 4d duration, marked pain with palpation at RUQ and possible hepatomegaly. No fever, nausea/vomiting, jaundice. ?liver or gallbladder disease, ?early shingles, ?RLL PNA.  Check CBC, CMP, lipase.  Check RUQ abd [place].  Check CXR.

## 2018-03-13 NOTE — Patient Instructions (Addendum)
Xray today Labs today We will schedule abdominal ultrasound to check liver.  We will be in touch with results.  Watch for new blistering rash developing.

## 2018-03-13 NOTE — Addendum Note (Signed)
Addended by: Ria Bush on: 03/13/2018 10:52 AM   Modules accepted: Orders

## 2018-03-13 NOTE — Telephone Encounter (Addendum)
Courtney Thomas with [facility] [place] called report on [place] abd RUQ which is in epic and I am taking report to Dr Darnell Level; pt is waiting.Dr Darnell Level said to tell pt [place] was normal and pt can go home. Verdis Frederickson voiced understanding and will let pt know.

## 2018-03-17 ENCOUNTER — Encounter: Payer: Self-pay | Admitting: Family Medicine

## 2018-03-17 DIAGNOSIS — J439 Emphysema, unspecified: Secondary | ICD-10-CM | POA: Insufficient documentation

## 2018-03-17 DIAGNOSIS — I700 Atherosclerosis of aorta: Secondary | ICD-10-CM | POA: Insufficient documentation

## 2018-03-21 ENCOUNTER — Ambulatory Visit (INDEPENDENT_AMBULATORY_CARE_PROVIDER_SITE_OTHER): Payer: Medicare HMO | Admitting: Family Medicine

## 2018-03-21 ENCOUNTER — Encounter: Payer: Self-pay | Admitting: Family Medicine

## 2018-03-21 ENCOUNTER — Other Ambulatory Visit: Payer: Self-pay | Admitting: Family Medicine

## 2018-03-21 VITALS — BP 130/76 | HR 88 | Temp 98.2°F | Ht <= 58 in | Wt 129.5 lb

## 2018-03-21 DIAGNOSIS — F4321 Adjustment disorder with depressed mood: Secondary | ICD-10-CM

## 2018-03-21 DIAGNOSIS — F5101 Primary insomnia: Secondary | ICD-10-CM | POA: Diagnosis not present

## 2018-03-21 DIAGNOSIS — R05 Cough: Secondary | ICD-10-CM | POA: Diagnosis not present

## 2018-03-21 DIAGNOSIS — R0789 Other chest pain: Secondary | ICD-10-CM

## 2018-03-21 DIAGNOSIS — R1011 Right upper quadrant pain: Secondary | ICD-10-CM

## 2018-03-21 DIAGNOSIS — R059 Cough, unspecified: Secondary | ICD-10-CM

## 2018-03-21 MED ORDER — DULOXETINE HCL 60 MG PO CPEP: 60.0000 mg | ORAL_CAPSULE | Freq: Every day | ORAL | 3 refills | Status: DC

## 2018-03-21 MED ORDER — ZOLPIDEM TARTRATE 10 MG PO TABS: 5.0000 mg | ORAL_TABLET | Freq: Every evening | ORAL | 0 refills | Status: DC | PRN

## 2018-03-21 MED ORDER — BUPROPION HCL ER (SR) 150 MG PO TB12: 150.0000 mg | ORAL_TABLET | Freq: Every day | ORAL | 3 refills | Status: DC

## 2018-03-21 MED ORDER — TRAZODONE HCL 50 MG PO TABS: 25.0000 mg | ORAL_TABLET | Freq: Every evening | ORAL | 1 refills | Status: DC | PRN

## 2018-03-21 MED ORDER — AZITHROMYCIN 250 MG PO TABS: ORAL_TABLET | ORAL | 0 refills | Status: DC

## 2018-03-21 NOTE — Assessment & Plan Note (Addendum)
Longstanding, currently using ambien 10mg  nightly. Discussed concerns with longterm use of non benzo hypnotics including increased mortality risk. Did recommend slow taper off medication - to start taking 1/2 tab at a time. Will Rx trazodone 25-50mg  nightly PRN sleep in its place.

## 2018-03-21 NOTE — Assessment & Plan Note (Signed)
This has resolved.

## 2018-03-21 NOTE — Assessment & Plan Note (Signed)
Support provided. Ongoing stress, anxiety, overwhelmed feeling. Encouraged she work on healthy stress relieving strategies. Will increase wellbutrin back to 150mg  SL daily, continue cymbalta 60mg  at night.

## 2018-03-21 NOTE — Patient Instructions (Addendum)
Increase wellbutrin to 150mg  in the morning. Continue cymbalta.  Ambien refilled - but I'd like [place] to work towards sparing use and slow titration off. May try trazodone 25-50mg  at bedtime in place of [place]. Update me with effect.  Sleep hygiene checklist: 1. Avoid naps during the day 2. Avoid stimulants such as caffeine and nicotine. Avoid bedtime alcohol (it can speed onset of sleep but the body's metabolism can cause awakenings). 3. All forms of exercise help ensure sound sleep - limit vigorous exercise to morning or late afternoon 4. Avoid food too close to bedtime including chocolate (which contains caffeine) 5. Soak up natural light 6. Establish regular bedtime routine. 7. Associate bed with sleep - avoid TV, computer or phone, reading while in bed. 8. Ensure pleasant, relaxing sleep environment - quiet, dark, cool room.

## 2018-03-21 NOTE — Progress Notes (Signed)
BP 130/76 (BP Location: Left Arm, Patient Position: Sitting, Cuff Size: Normal)   Pulse 88   Temp 98.2 F (36.8 C) (Oral)   Ht 4\' 10"  (1.473 m)   Wt 129 lb 8 oz (58.7 kg)   SpO2 96%   BMI 27.07 kg/m    CC: med refill visit Subjective:    Patient ID: NISHTHA RAIDER, female    DOB: 1946-01-10, 72 y.o.   MRN: [id number]  HPI: [place] COHICK is a 72 y.o. female presenting on 03/21/2018 for Discuss Medications   See prior note for details. Seen here last week with right sided abdominal pain. Workup (RUQ [place], CXR, labs) unrevealing. Pain is better, still present with cough, ?rib pain. Ongoing cough - present for 3 months.   Anxiety - worsening recently. Stress with daughters, stress over job. Works in [place]. Seeing someone - he is supportive. Has girl's night every Friday night. New puppy. Not depressed, but feels overwhelmed.   Insomnia - failed melatonin and benadryl.  Reviewed sleep hygiene.   Relevant past medical, surgical, family and social history reviewed and updated as indicated. Interim medical history since our last visit reviewed. Allergies and medications reviewed and updated. Outpatient Medications Prior to Visit  Medication Sig Dispense Refill  . alendronate (FOSAMAX) 70 MG tablet Take 1 tablet (70 mg total) by mouth every 7 (seven) days. Take with a full glass of water on an empty stomach. 12 tablet 3  . atorvastatin (LIPITOR) 10 MG tablet Take one tablet oral daily 90 tablet 3  . budesonide-formoterol (SYMBICORT) 80-4.5 MCG/ACT inhaler Inhale 1 puff into the lungs 2 (two) times daily. 1 Inhaler   . Calcium Carbonate-Vitamin D (CALCIUM 600/VITAMIN D) 600-400 MG-UNIT chew tablet Chew 1 tablet by mouth daily.    . cetirizine (ZYRTEC) 10 MG tablet Take 1 tablet (10 mg total) by mouth daily. 90 tablet 3  . cyanocobalamin (,VITAMIN B-12,) 1000 MCG/ML injection Inject 1,000 mcg into the muscle every 30 (thirty) days.    . Cyanocobalamin (VITAMIN B-12) 1000 MCG SUBL Place 1 tablet  (1,000 mcg total) under the tongue daily.  0  . ibuprofen (ADVIL,MOTRIN) 400 MG tablet Take 1.5 tablets (600 mg total) by mouth every 6 (six) hours as needed for moderate pain. (Patient taking differently: Take 200 mg by mouth every 6 (six) hours as needed for moderate pain. ) 20 tablet 0  . lisinopril-hydrochlorothiazide (PRINZIDE,ZESTORETIC) 10-12.5 MG tablet Take 1 tablet by mouth daily. 90 tablet 3  . naproxen sodium (ANAPROX) 220 MG tablet Take 220 mg by mouth every 4 (four) hours as needed. For arthritis.    . Vitamin D, Ergocalciferol, (DRISDOL) 50000 units CAPS capsule Take 1 capsule (50,000 Units total) by mouth every 7 (seven) days. 12 capsule 3  . buPROPion (WELLBUTRIN SR) 100 MG 12 hr tablet Take 1 tablet (100 mg total) by mouth daily. 90 tablet 3  . DULoxetine (CYMBALTA) 60 MG capsule Take 1 capsule (60 mg total) by mouth daily. 90 capsule 3  . zolpidem (AMBIEN) 10 MG tablet TAKE 1 TABLET BY MOUTH AT BEDTIME 30 tablet 0   No facility-administered medications prior to visit.      Per HPI unless specifically indicated in ROS section below Review of Systems     Objective:    BP 130/76 (BP Location: Left Arm, Patient Position: Sitting, Cuff Size: Normal)   Pulse 88   Temp 98.2 F (36.8 C) (Oral)   Ht 4\' 10"  (1.473 m)   Wt 129  lb 8 oz (58.7 kg)   SpO2 96%   BMI 27.07 kg/m   Wt Readings from Last 3 Encounters:  03/21/18 129 lb 8 oz (58.7 kg)  03/13/18 129 lb (58.5 kg)  09/18/17 123 lb 4 oz (55.9 kg)    Physical Exam  Constitutional: She appears well-developed and well-nourished. No distress.  HENT:  Mouth/Throat: Oropharynx is clear and moist. No oropharyngeal exudate.  Cardiovascular: Normal rate, regular rhythm and normal heart sounds.  No murmur heard. Pulmonary/Chest: Effort normal. No respiratory distress. She has no wheezes. She has rales (coarse RLL). She exhibits tenderness (R lateral lower ribcage tender to palpation).  LLL crackles clear with deep breath    Psychiatric: She has a normal mood and affect.  Nursing note and vitals reviewed.  Results for orders placed or performed in visit on 03/13/18  CBC with Differential/Platelet  Result Value Ref Range   WBC 6.0 4.0 - 10.5 K/uL   RBC 3.92 3.87 - 5.11 Mil/uL   Hemoglobin 11.4 (L) 12.0 - 15.0 g/dL   HCT 34.3 (L) 36.0 - 46.0 %   MCV 87.5 78.0 - 100.0 fl   MCHC 33.3 30.0 - 36.0 g/dL   RDW 14.7 11.5 - 15.5 %   Platelets 259.0 150.0 - 400.0 K/uL   Neutrophils Relative % 62.4 43.0 - 77.0 %   Lymphocytes Relative 22.1 12.0 - 46.0 %   Monocytes Relative 10.0 3.0 - 12.0 %   Eosinophils Relative 4.6 0.0 - 5.0 %   Basophils Relative 0.9 0.0 - 3.0 %   Neutro Abs 3.8 1.4 - 7.7 K/uL   Lymphs Abs 1.3 0.7 - 4.0 K/uL   Monocytes Absolute 0.6 0.1 - 1.0 K/uL   Eosinophils Absolute 0.3 0.0 - 0.7 K/uL   Basophils Absolute 0.1 0.0 - 0.1 K/uL  Comprehensive metabolic panel  Result Value Ref Range   Sodium 139 135 - 145 mEq/L   Potassium 3.4 (L) 3.5 - 5.1 mEq/L   Chloride 101 96 - 112 mEq/L   CO2 31 19 - 32 mEq/L   Glucose, Bld 74 70 - 99 mg/dL   BUN 19 6 - 23 mg/dL   Creatinine, Ser 0.77 0.40 - 1.20 mg/dL   Total Bilirubin 0.3 0.2 - 1.2 mg/dL   Alkaline Phosphatase 85 39 - 117 U/L   AST 7 0 - 37 U/L   ALT 14 0 - 35 U/L   Total Protein 6.8 6.0 - 8.3 g/dL   Albumin 4.0 3.5 - 5.2 g/dL   Calcium 9.4 8.4 - 10.5 mg/dL   GFR 78.64 >60.00 mL/min  Lipase  Result Value Ref Range   Lipase 22.0 11.0 - 59.0 U/L  DG Chest 2 View CLINICAL DATA:  Cough.  EXAM: CHEST - 2 VIEW  COMPARISON:  Radiographs of Dec 05, 2005.  FINDINGS: The heart size and mediastinal contours are within normal limits. No pneumothorax or pleural effusion is noted. Atherosclerosis of thoracic aorta is noted. Emphysematous disease is noted in the right upper lobe. No consolidative process is noted. The visualized skeletal structures are unremarkable.  IMPRESSION: No active cardiopulmonary disease.  Aortic Atherosclerosis  (ICD10-I70.0) and Emphysema (ICD10-J43.9).  Electronically Signed   By: Marijo Conception, M.D.   On: 03/13/2018 15:53 US Abdomen Limited RUQ CLINICAL DATA:  Right upper quadrant pain for the past 2 days.  EXAM: ULTRASOUND ABDOMEN LIMITED RIGHT UPPER QUADRANT  COMPARISON:  None.  FINDINGS: Gallbladder:  No gallstones or wall thickening visualized. No sonographic Murphy sign noted by  sonographer.  Common bile duct:  Diameter: 4 mm, normal.  Liver:  No focal lesion identified. Within normal limits in parenchymal echogenicity. Portal vein is patent on color Doppler imaging with normal direction of blood flow towards the liver.  IMPRESSION: Normal right upper quadrant ultrasound.  Electronically Signed   By: Titus Dubin M.D.   On: 03/13/2018 14:50      Assessment & Plan:   Problem List Items Addressed This Visit    RESOLVED: RUQ abdominal pain    This has resolved.       Right-sided chest wall pain    Anticipate rib strain that is slowly improving - see above for cough plan.       Insomnia    Longstanding, currently using ambien 10mg  nightly. Discussed concerns with longterm use of non benzo hypnotics including increased mortality risk. Did recommend slow taper off medication - to start taking 1/2 tab at a time. Will Rx trazodone 25-50mg  nightly PRN sleep in its place.      Cough    Persistent for 3 months. Recent CXR reassuring. Will Rx zpack to cover atypical infection. 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 Ongoing stress, anxiety, overwhelmed feeling. Encouraged she work on healthy stress relieving strategies. Will increase wellbutrin back to 150mg  SL daily, continue cymbalta 60mg  at night.           Meds ordered this encounter  Medications  . zolpidem (AMBIEN) 10 MG tablet    Sig: Take 0.5-1 tablets (5-10 mg total) by mouth at bedtime as  needed for sleep.    Dispense:  30 tablet    Refill:  0  . buPROPion (WELLBUTRIN SR) 150 MG 12 hr tablet    Sig: Take 1 tablet (150 mg total) by mouth daily.    Dispense:  90 tablet    Refill:  3  . DULoxetine (CYMBALTA) 60 MG capsule    Sig: Take 1 capsule (60 mg total) by mouth daily.    Dispense:  90 capsule    Refill:  3  . traZODone (DESYREL) 50 MG tablet    Sig: Take 0.5-1 tablets (25-50 mg total) by mouth at bedtime as needed for sleep.    Dispense:  30 tablet    Refill:  1  . azithromycin (ZITHROMAX) 250 MG tablet    Sig: Take two tablets on day one followed by one tablet on days 2-5    Dispense:  6 each    Refill:  0   No orders of the defined types were placed in this encounter.   Follow up plan: No follow-ups on file.  Ria Bush, MD

## 2018-03-21 NOTE — Assessment & Plan Note (Addendum)
Anticipate rib strain that is slowly improving - see above for cough plan.

## 2018-03-21 NOTE — Assessment & Plan Note (Signed)
Persistent for 3 months. Recent CXR reassuring. Will Rx zpack to cover atypical infection. If persistent cough, I asked pt to let me know and we will order CT scan

## 2018-03-22 ENCOUNTER — Ambulatory Visit
Admission: RE | Admit: 2018-03-22 | Discharge: 2018-03-22 | Disposition: A | Discharge status: Home / Self Care | Payer: Medicare HMO | Source: Ambulatory Visit | Attending: Family Medicine | Admitting: Family Medicine

## 2018-03-22 DIAGNOSIS — Z1231 Encounter for screening mammogram for malignant neoplasm of breast: Secondary | ICD-10-CM | POA: Diagnosis not present

## 2018-03-28 ENCOUNTER — Other Ambulatory Visit: Payer: Self-pay | Admitting: Family Medicine

## 2018-03-28 ENCOUNTER — Ambulatory Visit (INDEPENDENT_AMBULATORY_CARE_PROVIDER_SITE_OTHER): Payer: Medicare HMO

## 2018-03-28 VITALS — BP 116/78 | HR 73 | Temp 98.0°F | Ht 58.5 in | Wt 126.2 lb

## 2018-03-28 DIAGNOSIS — Z0000 Encounter for general adult medical examination without abnormal findings: Secondary | ICD-10-CM | POA: Diagnosis not present

## 2018-03-28 DIAGNOSIS — E785 Hyperlipidemia, unspecified: Secondary | ICD-10-CM

## 2018-03-28 DIAGNOSIS — E559 Vitamin D deficiency, unspecified: Secondary | ICD-10-CM

## 2018-03-28 DIAGNOSIS — E538 Deficiency of other specified B group vitamins: Secondary | ICD-10-CM

## 2018-03-28 LAB — LIPID PANEL
CHOL/HDL RATIO: 2
Cholesterol: 141 mg/dL (ref 0–200)
HDL: 67 mg/dL (ref >39.00)
LDL CALC: 63 mg/dL (ref 0–99)
NonHDL: 73.91
Triglycerides: 57 mg/dL (ref 0.0–149.0)
VLDL: 11.4 mg/dL (ref 0.0–40.0)

## 2018-03-28 LAB — VITAMIN D 25 HYDROXY (VIT D DEFICIENCY, FRACTURES): VITD: 15.61 ng/mL — AB (ref 30.00–100.00)

## 2018-03-28 LAB — VITAMIN B12: VITAMIN B 12: 379 pg/mL (ref 211–911)

## 2018-03-28 NOTE — Progress Notes (Signed)
Subjective:   Courtney Thomas is a 72 y.o. female who presents for Medicare Annual (Subsequent) preventive examination.  Review of Systems:  N/A Cardiac Risk Factors include: advanced age (>2mn, >>11women);dyslipidemia;hypertension     Objective:     Vitals: BP 116/78 (BP Location: Right Arm, Patient Position: Sitting, Cuff Size: Normal)   Pulse 73   Temp 98 F (36.7 C) (Oral)   Ht 4' 10.5" (1.486 m) Comment: no shoes  Wt 126 lb 4 oz (57.3 kg)   SpO2 95%   BMI 25.94 kg/m   Body mass index is 25.94 kg/m.  Advanced Directives 03/28/2018 03/27/2017 03/25/2016 12/06/2015  Does Patient Have a Medical Advance Directive? Yes Yes Yes Yes  Type of [place]aramed[place]of [place]Living will Living will;Healthcare Power of [place] will [place] will  Does patient want to make changes to medical advance directive? - - No - Patient declined No - Patient declined  Copy of [place] Chart? No - copy requested No - copy requested No - copy requested No - copy requested    Tobacco Social History   Tobacco Use  Smoking Status Former Smoker  Smokeless Tobacco Never Used  Tobacco Comment   minimal smoking as a teenager     Counseling given: No Comment: minimal smoking as a teenager   Clinical Intake:  Pre-visit preparation completed: Yes  Pain : No/denies pain Pain Score: 0-No pain(rib pain when coughing)     Nutritional Status: BMI 25 -29 Overweight Nutritional Risks: None Diabetes: No  How often do you need to have someone help you when you read instructions, pamphlets, or other written materials from your doctor or pharmacy?: 1 - Never What is the last grade level you completed in school?: Bachelors degree  Interpreter Needed?: No  Comments: pt is a widow and lives alone Information entered by :: LPinson, LPN  Past Medical History:  Diagnosis Date  . Adjustment  disorder with depressed mood   . Asthma   . B12 deficiency anemia 2014   mild  . Frequent headaches   . History of chicken pox   . Hyperlipidemia   . Hypertension   . Insomnia   . 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 Seasonal allergic rhinitis    Past Surgical History:  Procedure Laterality Date  . ABDOMINAL HYSTERECTOMY    . BREAST CYST ASPIRATION Left 80s   benign  . BREAST EXCISIONAL BIOPSY Right 1971   neg  . COLONOSCOPY  07/2013   WNL per patient ([place])  . dexa  03/2014   T score spine -3.5, hip -2.0  . NASAL SINUS SURGERY     deviated septum  . TONSILLECTOMY  1970  . TOTAL ABDOMINAL HYSTERECTOMY W/ BILATERAL SALPINGOOPHORECTOMY  1994   uterine cyst with heavy bleeding   Family History  Problem Relation Age of Onset  . Cancer Mother 664      lung met to brain, smoker  . Stroke Mother   . Alzheimer's disease Mother   . Arthritis Father   . Arthritis Sister   . Cancer Brother 779      pancreatic  . Diabetes Maternal Grandmother   . Breast cancer Neg Hx    Social History   Socioeconomic History  . Marital status: Married    Spouse name: Not on file  . Number of  children: Not on file  . Years of education: Not on file  . Highest education level: Not on file  Occupational History  . Not on file  Social Needs  . Financial resource strain: Not on file  . Food insecurity:    Worry: Not on file    Inability: Not on file  . Transportation needs:    Medical: Not on file    Non-medical: Not on file  Tobacco Use  . Smoking status: Former Research scientist (life sciences)  . Smokeless tobacco: Never Used  . Tobacco comment: minimal smoking as a teenager  Substance and Sexual Activity  . Alcohol use: No  . Drug use: No  . Sexual activity: Never  Lifestyle  . Physical activity:    Days per week: Not on file    Minutes per session: Not on file  . Stress: Not on file  Relationships  . Social connections:    Talks on phone:  Not on file    Gets together: Not on file    Attends religious service: Not on file    Active member of club or organization: Not on file    Attends meetings of clubs or organizations: Not on file    Relationship status: Not on file  Other Topics Concern  . Not on file  Social History Narrative   Lives alone - husband passed away 2013/10/13. 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 alendronate (FOSAMAX) 70 MG tablet Take 1 tablet (70 mg total) by mouth every 7 (seven) days. Take with a full glass of water on an empty stomach.  Marland Kitchen atorvastatin (LIPITOR) 10 MG tablet Take one tablet oral daily  . azithromycin (ZITHROMAX) 250 MG tablet Take two tablets on day one followed by one tablet on days 2-5  . budesonide-formoterol (SYMBICORT) 80-4.5 MCG/ACT inhaler Inhale 1 puff into the lungs 2 (two) times daily.  Marland Kitchen buPROPion (WELLBUTRIN SR) 150 MG 12 hr tablet Take 1 tablet (150 mg total) by mouth daily.  . Calcium Carbonate-Vitamin D (CALCIUM 600/VITAMIN D) 600-400 MG-UNIT chew tablet Chew 1 tablet by mouth daily.  . cetirizine (ZYRTEC) 10 MG tablet Take 1 tablet (10 mg total) by mouth daily.  . cyanocobalamin (,VITAMIN B-12,) 1000 MCG/ML injection Inject 1,000 mcg into the muscle every 30 (thirty) days.  . Cyanocobalamin (VITAMIN B-12) 1000 MCG SUBL Place 1 tablet (1,000 mcg total) under the tongue daily.  . DULoxetine (CYMBALTA) 60 MG capsule Take 1 capsule (60 mg total) by mouth daily.  Marland Kitchen ibuprofen (ADVIL,MOTRIN) 400 MG tablet Take 1.5 tablets (600 mg total) by mouth every 6 (six) hours as needed for moderate pain. (Patient taking differently: Take 200 mg by mouth every 6 (six) hours as needed for moderate pain. )  . lisinopril-hydrochlorothiazide (PRINZIDE,ZESTORETIC) 10-12.5 MG tablet Take 1  tablet by mouth daily.  . naproxen sodium (ANAPROX) 220 MG tablet Take 220 mg by mouth every 4 (four) hours as needed. For arthritis.  Marland Kitchen traZODone (DESYREL) 50 MG tablet Take 0.5-1 tablets (25-50 mg total) by mouth at bedtime as needed for sleep.  . Vitamin D, Ergocalciferol, (DRISDOL) 50000 units CAPS capsule Take 1 capsule (50,000 Units total) by mouth every 7 (seven) days.  Marland Kitchen zolpidem (AMBIEN) 10 MG tablet Take 0.5-1 tablets (5-10 mg total) by mouth at bedtime  as needed for sleep.   No facility-administered encounter medications on file as of 03/28/2018.     Activities of Daily Living In your present state of health, do you have any difficulty performing the following activities: 03/28/2018  Hearing? N  Vision? N  Difficulty concentrating or making decisions? N  Walking or climbing stairs? N  Dressing or bathing? N  Doing errands, shopping? N  Preparing Food and eating ? N  Using the Toilet? N  In the past six months, have you accidently leaked urine? N  Do you have problems with loss of bowel control? N  Managing your Medications? N  Managing your Finances? N  Housekeeping or managing your Housekeeping? N  Some recent data might be hidden    Patient Care Team: Ria Bush, MD as PCP - General (Family Medicine) Lorelee Cover., MD as Consulting Physician (Ophthalmology) Mellissa Kohut, DDS, PA as Consulting Physician (Dentistry)    Assessment:   This is a routine wellness examination for [place].   Hearing Screening   '125Hz'  '250Hz'  '500Hz'  '1000Hz'  '2000Hz'  '3000Hz'  '4000Hz'  '6000Hz'  '8000Hz'   Right ear:   0 0 40  0    Left ear:   40 40 40  40    Vision Screening Comments: Vision exam in April 2019 with Dr. Gloriann Loan   Exercise Activities and Dietary recommendations Current Exercise Habits: The patient does not participate in regular exercise at present, Exercise limited by: Other - see comments(fatigued after work)  Goals    . Patient Stated     Starting 03/28/2018, I will continue to  take medications as prescribed.        Fall Risk Fall Risk  03/28/2018 03/27/2017 03/25/2016 03/17/2015 02/21/2014  Falls in the past year? No No No No Yes  Number falls in past yr: - - - - 1  Comment - - - - tripped over her pants at home  Injury with Fall? - - - - Yes  Comment - - - - Broke nose 11/2013   Depression Screen PHQ 2/9 Scores 03/28/2018 03/21/2018 03/27/2017 03/25/2016  PHQ - 2 Score 0 0 0 1  PHQ- 9 Score 0 4 1 -     Cognitive Function MMSE - Mini Mental State Exam 03/28/2018 03/27/2017 03/25/2016  Orientation to time '5 5 5  ' Orientation to Place '5 5 5  ' Registration '3 3 3  ' Attention/ Calculation 0 0 0  Recall '2 3 3  ' Recall-comments unable to recall 1 of 3 words - -  Language- name 2 objects 0 0 0  Language- repeat '1 1 1  ' Language- follow 3 step command '3 3 3  ' Language- read & follow direction 0 0 0  Write a sentence 0 0 0  Copy design 0 0 0  Total score '19 20 20     ' PLEASE NOTE: A Mini-Cog screen was completed. Maximum score is 20. A value of 0 denotes this part of [place]stein MMSE was not completed or the patient failed this part of the Mini-Cog screening.   Mini-Cog Screening Orientation to Time - Max 5 pts Orientation to Place - Max 5 pts Registration - Max 3 pts Recall - Max 3 pts Language Repeat - Max 1 pts Language Follow 3 Step Command - Max 3 pts     Immunization History  Administered Date(s) Administered  . Influenza, Quadrivalent, Recombinant, Inj, Pf 04/01/2016, 05/09/2017  . Pneumococcal Conjugate-13 08/27/2015  . Pneumococcal Polysaccharide-23 04/05/2017  . Tdap 08/02/2011    Screening Tests Health Maintenance  Topic Date Due  . INFLUENZA VACCINE  10/31/2018 (Originally 03/01/2018)  . DTaP/Tdap/Td (2 - Td) 03/25/2024 (Originally 08/01/2021)  . MAMMOGRAM  03/22/2020  . TETANUS/TDAP  08/01/2021  . COLONOSCOPY  08/02/2023  . DEXA SCAN  Completed  . Hepatitis C Screening  Completed  . PNA vac Low Risk Adult  Completed       Plan:   I have  personally reviewed, addressed, and noted the following in the patient's chart:  A. Medical and social history B. Use of alcohol, tobacco or illicit drugs  C. Current medications and supplements D. Functional ability and status E.  Nutritional status F.  Physical activity G. Advance directives H. List of other physicians I.  Hospitalizations, surgeries, and ER visits in previous 12 months J.  [place] to include hearing, vision, cognitive, depression L. Referrals and appointments - none  In addition, I have reviewed and discussed with patient certain preventive protocols, quality metrics, and best practice recommendations. A written personalized care plan for preventive services as well as general preventive health recommendations were provided to patient.  See attached scanned questionnaire for additional information.   Signed,   Lindell Noe, MHA, BS, LPN Health Coach

## 2018-03-28 NOTE — Progress Notes (Signed)
PCP notes:   Health maintenance:  Flu vaccine - addressed  Abnormal screenings:   Hearing - failed  Hearing Screening   125Hz  250Hz  500Hz  1000Hz  2000Hz  3000Hz  4000Hz  6000Hz  8000Hz   Right ear:   0 0 40  0    Left ear:   40 40 40  40     Mini-Cog score: 19/20 MMSE - Mini Mental State Exam 03/28/2018 03/27/2017 03/25/2016  Orientation to time 5 5 5   Orientation to Place 5 5 5   Registration 3 3 3   Attention/ Calculation 0 0 0  Recall 2 3 3   Recall-comments unable to recall 1 of 3 words - -  Language- name 2 objects 0 0 0  Language- repeat 1 1 1   Language- follow 3 step command 3 3 3   Language- read & follow direction 0 0 0  Write a sentence 0 0 0  Copy design 0 0 0  Total score 19 20 20     Patient concerns:   Patient reports concerns with chronic cough which causes rib pain. PCP is aware due to previous office visit on 03/21/18.   Nurse concerns:  None  Next PCP appt:   04/10/18 @ 1500  I reviewed health advisor's note, was available for consultation, and agree with documentation and plan. Loura Pardon MD

## 2018-03-28 NOTE — Patient Instructions (Signed)
Courtney Thomas , Thank you for taking time to come for your Medicare Wellness Visit. I appreciate your ongoing commitment to your health goals. Please review the following plan we discussed and let me know if I can assist you in the future.   These are the goals we discussed: Goals    . Patient Stated     Starting 03/28/2018, I will continue to take medications as prescribed.        This is a list of the screening recommended for you and due dates:  Health Maintenance  Topic Date Due  . Flu Shot  10/31/2018*  . DTaP/Tdap/Td vaccine (2 - Td) 03/25/2024*  . Mammogram  03/22/2020  . Tetanus Vaccine  08/01/2021  . Colon Cancer Screening  08/02/2023  . DEXA scan (bone density measurement)  Completed  .  Hepatitis C: One time screening is recommended by Center for Disease Control  (CDC) for  adults born from [birth year] through 1963.   Completed  . Pneumonia vaccines  Completed  *Topic was postponed. The date shown is not the original due date.   Preventive Care for Adults  A healthy lifestyle and preventive care can promote health and wellness. Preventive health guidelines for adults include the following key practices.  . A routine yearly physical is a good way to check with your health care provider about your health and preventive screening. It is a chance to share any concerns and updates on your health and to receive a thorough exam.  . Visit your dentist for a routine exam and preventive care every 6 months. Brush your teeth twice a day and floss once a day. Good oral hygiene prevents tooth decay and gum disease.  . The frequency of eye exams is based on your age, health, family medical history, use  of contact lenses, and other factors. Follow your health care provider's recommendations for frequency of eye exams.  . Eat a healthy diet. Foods like vegetables, fruits, whole grains, low-fat dairy products, and lean protein foods contain the nutrients you need without too many calories. Decrease  your intake of foods high in solid fats, added sugars, and salt. Eat the right amount of calories for you. Get information about a proper diet from your health care provider, if necessary.  . Regular physical exercise is one of the most important things you can do for your health. Most adults should get at least 150 minutes of moderate-intensity exercise (any activity that increases your heart rate and causes you to sweat) each week. In addition, most adults need muscle-strengthening exercises on 2 or more days a week.  Silver Sneakers may be a benefit available to you. To determine eligibility, you may visit the website: www.silversneakers.com or contact program at (416)428-4107 Mon-Fri between 8AM-8PM.   . Maintain a healthy weight. The body mass index (BMI) is a screening tool to identify possible weight problems. It provides an estimate of body fat based on height and weight. Your health care provider can find your BMI and can help you achieve or maintain a healthy weight.   For adults 20 years and older: ? A BMI below 18.5 is considered underweight. ? A BMI of 18.5 to 24.9 is normal. ? A BMI of 25 to 29.9 is considered overweight. ? A BMI of 30 and above is considered obese.   . Maintain normal blood lipids and cholesterol levels by exercising and minimizing your intake of saturated fat. Eat a balanced diet with plenty of fruit and vegetables. Blood  tests for lipids and cholesterol should begin at age 38 and be repeated every 5 years. If your lipid or cholesterol levels are high, you are over 50, or you are at high risk for heart disease, you may need your cholesterol levels checked more frequently. Ongoing high lipid and cholesterol levels should be treated with medicines if diet and exercise are not working.  . If you smoke, find out from your health care provider how to quit. If you do not use tobacco, please do not start.  . If you choose to drink alcohol, please do not consume more than  2 drinks per day. One drink is considered to be 12 ounces (355 mL) of beer, 5 ounces (148 mL) of wine, or 1.5 ounces (44 mL) of liquor.  . If you are 68-74 years old, ask your health care provider if you should take aspirin to prevent strokes.  . Use sunscreen. Apply sunscreen liberally and repeatedly throughout the day. You should seek shade when your shadow is shorter than you. Protect yourself by wearing long sleeves, pants, a wide-brimmed hat, and sunglasses year round, whenever you are outdoors.  . Once a month, do a whole body skin exam, using a mirror to look at the skin on your back. Tell your health care provider of new moles, moles that have irregular borders, moles that are larger than a pencil eraser, or moles that have changed in shape or color.

## 2018-04-06 ENCOUNTER — Ambulatory Visit: Payer: Medicare HMO

## 2018-04-10 ENCOUNTER — Encounter: Payer: Self-pay | Admitting: Family Medicine

## 2018-04-10 ENCOUNTER — Ambulatory Visit (INDEPENDENT_AMBULATORY_CARE_PROVIDER_SITE_OTHER): Payer: Medicare HMO | Admitting: Family Medicine

## 2018-04-10 VITALS — BP 120/62 | HR 84 | Temp 98.0°F | Ht 58.5 in | Wt 124.0 lb

## 2018-04-10 DIAGNOSIS — Z0000 Encounter for general adult medical examination without abnormal findings: Secondary | ICD-10-CM

## 2018-04-10 DIAGNOSIS — Z7189 Other specified counseling: Secondary | ICD-10-CM

## 2018-04-10 DIAGNOSIS — I700 Atherosclerosis of aorta: Secondary | ICD-10-CM

## 2018-04-10 DIAGNOSIS — R0789 Other chest pain: Secondary | ICD-10-CM

## 2018-04-10 DIAGNOSIS — M810 Age-related osteoporosis without current pathological fracture: Secondary | ICD-10-CM

## 2018-04-10 DIAGNOSIS — E538 Deficiency of other specified B group vitamins: Secondary | ICD-10-CM

## 2018-04-10 DIAGNOSIS — E559 Vitamin D deficiency, unspecified: Secondary | ICD-10-CM | POA: Diagnosis not present

## 2018-04-10 DIAGNOSIS — E785 Hyperlipidemia, unspecified: Secondary | ICD-10-CM | POA: Diagnosis not present

## 2018-04-10 DIAGNOSIS — J439 Emphysema, unspecified: Secondary | ICD-10-CM

## 2018-04-10 DIAGNOSIS — F5101 Primary insomnia: Secondary | ICD-10-CM | POA: Diagnosis not present

## 2018-04-10 DIAGNOSIS — I10 Essential (primary) hypertension: Secondary | ICD-10-CM | POA: Diagnosis not present

## 2018-04-10 DIAGNOSIS — F4321 Adjustment disorder with depressed mood: Secondary | ICD-10-CM

## 2018-04-10 MED ORDER — LISINOPRIL-HYDROCHLOROTHIAZIDE 10-12.5 MG PO TABS: 1.0000 | ORAL_TABLET | Freq: Every day | ORAL | 3 refills | Status: DC

## 2018-04-10 MED ORDER — BUDESONIDE-FORMOTEROL FUMARATE 80-4.5 MCG/ACT IN AERO: 1.0000 | INHALATION_SPRAY | Freq: Two times a day (BID) | RESPIRATORY_TRACT | 3 refills | Status: DC

## 2018-04-10 MED ORDER — VITAMIN D3 1.25 MG (50000 UT) PO TABS: 1.0000 | ORAL_TABLET | ORAL | 1 refills | Status: DC

## 2018-04-10 MED ORDER — ATORVASTATIN CALCIUM 10 MG PO TABS: ORAL_TABLET | ORAL | 3 refills | Status: DC

## 2018-04-10 NOTE — Patient Instructions (Addendum)
If interested, check with pharmacy about new 2 shot shingles series (shingrix).  Bring [place] copy of your advanced directive. Start b12 500-1068mg daily, dissolvable tablets if you can find them. Start vitamin D 50,000 units weekly (prescription strength sent to pharmacy) Return in 6 months for follow up visit with labs.  Health Maintenance, Female Adopting a healthy lifestyle and getting preventive care can go a long way to promote health and wellness. Talk with your health care provider about what schedule of regular examinations is right for you. This is a good chance for you to check in with your provider about disease prevention and staying healthy. In between checkups, there are plenty of things you can do on your own. Experts have done a lot of research about which lifestyle changes and preventive measures are most likely to keep you healthy. Ask your health care provider for more information. Weight and diet Eat a healthy diet  Be sure to include plenty of vegetables, fruits, low-fat dairy products, and lean protein.  Do not eat a lot of foods high in solid fats, added sugars, or salt.  Get regular exercise. This is one of the most important things you can do for your health. ? Most adults should exercise for at least 150 minutes each week. The exercise should increase your heart rate and make you sweat (moderate-intensity exercise). ? Most adults should also do strengthening exercises at least twice a week. This is in addition to the moderate-intensity exercise.  Maintain a healthy weight  Body mass index (BMI) is a measurement that can be used to identify possible weight problems. It estimates body fat based on height and weight. Your health care provider can help determine your BMI and help you achieve or maintain a healthy weight.  For females 272years of age and older: ? A BMI below 18.5 is considered underweight. ? A BMI of 18.5 to 24.9 is normal. ? A BMI of 25 to 29.9 is  considered overweight. ? A BMI of 30 and above is considered obese.  Watch levels of cholesterol and blood lipids  You should start having your blood tested for lipids and cholesterol at 70years of age, then have this test every 5 years.  You may need to have your cholesterol levels checked more often if: ? Your lipid or cholesterol levels are high. ? You are older than 70years of age. ? You are at high risk for heart disease.  Cancer screening Lung Cancer  Lung cancer screening is recommended for adults 534825years old who are at high risk for lung cancer because of a history of smoking.  A yearly low-dose CT scan of the lungs is recommended for people who: ? Currently smoke. ? Have quit within the past 15 years. ? Have at least a 30-pack-year history of smoking. A pack year is smoking an average of one pack of cigarettes a day for 1 year.  Yearly screening should continue until it has been 15 years since you quit.  Yearly screening should stop if you develop a health problem that would prevent you from having lung cancer treatment.  Breast Cancer  Practice breast self-awareness. This means understanding how your breasts normally appear and feel.  It also means doing regular breast self-exams. Let your health care provider know about any changes, no matter how small.  If you are in your 20s or 30s, you should have a clinical breast exam (CBE) by a health care provider every 1-3 years as  part of a regular health exam.  If you are 40 or older, have a CBE every year. Also consider having a breast X-ray (mammogram) every year.  If you have a family history of breast cancer, talk to your health care provider about genetic screening.  If you are at high risk for breast cancer, talk to your health care provider about having an MRI and a mammogram every year.  Breast cancer gene (BRCA) assessment is recommended for women who have family members with BRCA-related cancers.  BRCA-related cancers include: ? Breast. ? Ovarian. ? Tubal. ? Peritoneal cancers.  Results of the assessment will determine the need for genetic counseling and BRCA1 and BRCA2 testing.  Cervical Cancer Your health care provider may recommend that you be screened regularly for cancer of the pelvic organs (ovaries, uterus, and vagina). This screening involves a pelvic examination, including checking for microscopic changes to the surface of your cervix (Pap test). You may be encouraged to have this screening done every 3 years, beginning at age 73.  For women ages 67-65, health care providers may recommend pelvic exams and Pap testing every 3 years, or they may recommend the Pap and pelvic exam, combined with testing for human papilloma virus (HPV), every 5 years. Some types of HPV increase your risk of cervical cancer. Testing for HPV may also be done on women of any age with unclear Pap test results.  Other health care providers may not recommend any screening for nonpregnant women who are considered low risk for pelvic cancer and who do not have symptoms. Ask your health care provider if a screening pelvic exam is right for you.  If you have had past treatment for cervical cancer or a condition that could lead to cancer, you need Pap tests and screening for cancer for at least 20 years after your treatment. If Pap tests have been discontinued, your risk factors (such as having a new sexual partner) need to be reassessed to determine if screening should resume. Some women have medical problems that increase the chance of getting cervical cancer. In these cases, your health care provider may recommend more frequent screening and Pap tests.  Colorectal Cancer  This type of cancer can be detected and often prevented.  Routine colorectal cancer screening usually begins at 70 years of age and continues through 70 years of age.  Your health care provider may recommend screening at an earlier age if  you have risk factors for colon cancer.  Your health care provider may also recommend using home test kits to check for hidden blood in the stool.  A small camera at the end of a tube can be used to examine your colon directly (sigmoidoscopy or colonoscopy). This is done to check for the earliest forms of colorectal cancer.  Routine screening usually begins at age 52.  Direct examination of the colon should be repeated every 5-10 years through 70 years of age. However, you may need to be screened more often if early forms of precancerous polyps or small growths are found.  Skin Cancer  Check your skin from head to toe regularly.  Tell your health care provider about any new moles or changes in moles, especially if there is a change in a mole's shape or color.  Also tell your health care provider if you have a mole that is larger than the size of a pencil eraser.  Always use sunscreen. Apply sunscreen liberally and repeatedly throughout the day.  Protect yourself by wearing  long sleeves, pants, a wide-brimmed hat, and sunglasses whenever you are outside.  Heart disease, diabetes, and high blood pressure  High blood pressure causes heart disease and increases the risk of stroke. High blood pressure is more likely to develop in: ? People who have blood pressure in the high end of the normal range (130-139/85-89 mm Hg). ? People who are overweight or obese. ? People who are African American.  If you are 30-49 years of age, have your blood pressure checked every 3-5 years. If you are 21 years of age or older, have your blood pressure checked every year. You should have your blood pressure measured twice-once when you are at a hospital or clinic, and once when you are not at a hospital or clinic. Record the average of the two measurements. To check your blood pressure when you are not at a hospital or clinic, you can use: ? An automated blood pressure machine at a pharmacy. ? A home blood  pressure monitor.  If you are between 65 years and 83 years old, ask your health care provider if you should take aspirin to prevent strokes.  Have regular diabetes screenings. This involves taking a blood sample to check your fasting blood sugar level. ? If you are at a normal weight and have a low risk for diabetes, have this test once every three years after 70 years of age. ? If you are overweight and have a high risk for diabetes, consider being tested at a younger age or more often. Preventing infection Hepatitis B  If you have a higher risk for hepatitis B, you should be screened for this virus. You are considered at high risk for hepatitis B if: ? You were born in a country where hepatitis B is common. Ask your health care provider which countries are considered high risk. ? Your parents were born in a high-risk country, and you have not been immunized against hepatitis B (hepatitis B vaccine). ? You have HIV or AIDS. ? You use needles to inject street drugs. ? You live with someone who has hepatitis B. ? You have had sex with someone who has hepatitis B. ? You get hemodialysis treatment. ? You take certain medicines for conditions, including cancer, organ transplantation, and autoimmune conditions.  Hepatitis C  Blood testing is recommended for: ? Everyone born from [birth year] through 1963. ? Anyone with known risk factors for hepatitis C.  Sexually transmitted infections (STIs)  You should be screened for sexually transmitted infections (STIs) including gonorrhea and chlamydia if: ? You are sexually active and are younger than 70 years of age. ? You are older than 70 years of age and your health care provider tells you that you are at risk for this type of infection. ? Your sexual activity has changed since you were last screened and you are at an increased risk for chlamydia or gonorrhea. Ask your health care provider if you are at risk.  If you do not have HIV, but are at risk,  it may be recommended that you take a prescription medicine daily to prevent HIV infection. This is called pre-exposure prophylaxis (PrEP). You are considered at risk if: ? You are sexually active and do not regularly use condoms or know the HIV status of your partner(s). ? You take drugs by injection. ? You are sexually active with a partner who has HIV.  Talk with your health care provider about whether you are at high risk of being infected with HIV.  If you choose to begin PrEP, you should first be tested for HIV. You should then be tested every 3 months for as long as you are taking PrEP. Pregnancy  If you are premenopausal and you may become pregnant, ask your health care provider about preconception counseling.  If you may become pregnant, take 400 to 800 micrograms (mcg) of folic acid every day.  If you want to prevent pregnancy, talk to your health care provider about birth control (contraception). Osteoporosis and menopause  Osteoporosis is a disease in which the bones lose minerals and strength with aging. This can result in serious bone fractures. Your risk for osteoporosis can be identified using a bone density scan.  If you are 18 years of age or older, or if you are at risk for osteoporosis and fractures, ask your health care provider if you should be screened.  Ask your health care provider whether you should take a calcium or vitamin D supplement to lower your risk for osteoporosis.  Menopause may have certain physical symptoms and risks.  Hormone replacement therapy may reduce some of these symptoms and risks. Talk to your health care provider about whether hormone replacement therapy is right for you. Follow these instructions at home:  Schedule regular health, dental, and eye exams.  Stay current with your immunizations.  Do not use any tobacco products including cigarettes, chewing tobacco, or electronic cigarettes.  If you are pregnant, do not drink  alcohol.  If you are breastfeeding, limit how much and how often you drink alcohol.  Limit alcohol intake to no more than 1 drink per day for nonpregnant women. One drink equals 12 ounces of beer, 5 ounces of wine, or 1 ounces of hard liquor.  Do not use street drugs.  Do not share needles.  Ask your health care provider for help if you need support or information about quitting drugs.  Tell your health care provider if you often feel depressed.  Tell your health care provider if you have ever been abused or do not feel safe at home. This information is not intended to replace advice given to you by your health care provider. Make sure you discuss any questions you have with your health care provider. Document Released: 01/31/2011 Document Revised: 12/24/2015 Document Reviewed: 04/21/2015 [employer] Interactive Patient Education  [employer].

## 2018-04-10 NOTE — Progress Notes (Signed)
BP 120/62 (BP Location: Left Arm, Patient Position: Sitting, Cuff Size: Normal)   Pulse 84   Temp 98 F (36.7 C) (Oral)   Ht 4' 10.5" (1.486 m)   Wt 124 lb (56.2 kg)   SpO2 95%   BMI 25.47 kg/m    CC: CPE Subjective:    Patient ID: Courtney Thomas, female    DOB: 05-Dec-1945, 72 y.o.   MRN: [id number]  HPI: Courtney Thomas is a 72 y.o. female presenting on 04/10/2018 for Annual Exam (Pt 2.)   Saw Katha Cabal last week for medicare wellness visit. Note reviewed. Failed hearing screen on right. She denies trouble at home. Cough is improving- rib pain is improving.   Preventative: Colonoscopy - 07/2013 WNL 10 yr f/u ([place]) Well woman - with GYN, states pap smear 2016-11-24 WNL ([facility] in [place] - to retire). S/p hysterectomy and BSO.  Mammogram at [facility] 03/2018 WNL. She does breast exams at home.  DEXA 05/2016 T -3.5 --> 3.3 spine, -2.0 --> -1.7 hip, on fosamax since prior to 03/2014. No jaw or hip pain.  Egg free flu shot yearly.  Prevnar 2015/11/25. Pneumovax 24-Nov-2016.  Tdap 11/25/11 with bad reaction  Shingrix - discussed Advanced directives: has at home. HCPOA is daughter [name]. To bring me copy of living will. Seat belt use discussed Sunscreen use discussed. No changing moles on skin. Non smoker Alcohol - none Dentist Q6 mo Eye exam yearly  Lives alone - husband passed away 11/24/2013. LBD + parkinson disease Started seeing new partner Nov 24, 2016.  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 Interim medical history since our last visit reviewed. Allergies and medications reviewed and updated. Outpatient Medications Prior to Visit  Medication Sig Dispense Refill  . alendronate (FOSAMAX) 70 MG tablet Take 1 tablet (70 mg total) by mouth every 7 (seven) days. Take with a full glass of water on an empty  stomach. 12 tablet 3  . buPROPion (WELLBUTRIN SR) 150 MG 12 hr tablet Take 1 tablet (150 mg total) by mouth daily. 90 tablet 3  . Calcium Carbonate-Vitamin D (CALCIUM 600/VITAMIN D) 600-400 MG-UNIT chew tablet Chew 1 tablet by mouth daily.    . cetirizine (ZYRTEC) 10 MG tablet Take 1 tablet (10 mg total) by mouth daily. 90 tablet 3  . cyanocobalamin (,VITAMIN B-12,) 1000 MCG/ML injection Inject 1,000 mcg into the muscle every 30 (thirty) days.    . Cyanocobalamin (VITAMIN B-12) 1000 MCG SUBL Place 1 tablet (1,000 mcg total) under the tongue daily.  0  . DULoxetine (CYMBALTA) 60 MG capsule Take 1 capsule (60 mg total) by mouth daily. 90 capsule 3  . ibuprofen (ADVIL,MOTRIN) 400 MG tablet Take 1.5 tablets (600 mg total) by mouth every 6 (six) hours as needed for moderate pain. (Patient taking differently: Take 200 mg by mouth every 6 (six) hours as needed for moderate pain. ) 20 tablet 0  . Vitamin D, Ergocalciferol, (DRISDOL) 50000 units CAPS capsule Take 1 capsule (50,000 Units total) by mouth every 7 (seven) days. 12 capsule 3  . zolpidem (AMBIEN) 10 MG tablet Take 0.5-1 tablets (5-10 mg total) by mouth at bedtime as needed for sleep. 30 tablet 0  . atorvastatin (LIPITOR) 10 MG tablet Take one tablet oral daily 90 tablet 3  . budesonide-formoterol (SYMBICORT) 80-4.5 MCG/ACT inhaler Inhale  1 puff into the lungs 2 (two) times daily. 1 Inhaler   . lisinopril-hydrochlorothiazide (PRINZIDE,ZESTORETIC) 10-12.5 MG tablet Take 1 tablet by mouth daily. 90 tablet 3  . traZODone (DESYREL) 50 MG tablet Take 0.5-1 tablets (25-50 mg total) by mouth at bedtime as needed for sleep. (Patient not taking: Reported on 04/10/2018) 30 tablet 1  . azithromycin (ZITHROMAX) 250 MG tablet Take two tablets on day one followed by one tablet on days 2-5 6 each 0  . naproxen sodium (ANAPROX) 220 MG tablet Take 220 mg by mouth every 4 (four) hours as needed. For arthritis.     No facility-administered medications prior to visit.       Per HPI unless specifically indicated in ROS section below Review of Systems  Constitutional: Negative for activity change, appetite change, chills, fatigue, fever and unexpected weight change.  HENT: Negative for hearing loss.   Eyes: Negative for visual disturbance.  Respiratory: Negative for cough, chest tightness, shortness of breath and wheezing.   Cardiovascular: Negative for chest pain, palpitations and leg swelling.  Gastrointestinal: Negative for abdominal distention, abdominal pain, blood in stool, constipation, diarrhea, nausea and vomiting.  Genitourinary: Negative for difficulty urinating and hematuria.  Musculoskeletal: Negative for arthralgias, myalgias and neck pain.  Skin: Negative for rash.  Neurological: Negative for dizziness, seizures, syncope and headaches.  Hematological: Negative for adenopathy. Bruises/bleeds easily.  Psychiatric/Behavioral: Negative for dysphoric mood. The patient is not nervous/anxious.        Objective:    BP 120/62 (BP Location: Left Arm, Patient Position: Sitting, Cuff Size: Normal)   Pulse 84   Temp 98 F (36.7 C) (Oral)   Ht 4' 10.5" (1.486 m)   Wt 124 lb (56.2 kg)   SpO2 95%   BMI 25.47 kg/m   Wt Readings from Last 3 Encounters:  04/10/18 124 lb (56.2 kg)  03/28/18 126 lb 4 oz (57.3 kg)  03/21/18 129 lb 8 oz (58.7 kg)    Physical Exam  Constitutional: She is oriented to person, place, and time. She appears well-developed and well-nourished. No distress.  HENT:  Head: Normocephalic and atraumatic.  Right Ear: Hearing, tympanic membrane, external ear and ear canal normal.  Left Ear: Hearing, tympanic membrane, external ear and ear canal normal.  Nose: Nose normal.  Mouth/Throat: Uvula is midline, oropharynx is clear and moist and mucous membranes are normal. No oropharyngeal exudate, posterior oropharyngeal edema or posterior oropharyngeal erythema.  Eyes: Pupils are equal, round, and reactive to light. Conjunctivae and  EOM are normal. No scleral icterus.  Neck: Normal range of motion. Neck supple. No thyromegaly present.  Cardiovascular: Normal rate, regular rhythm, normal heart sounds and intact distal pulses.  No murmur heard. Pulses:      Radial pulses are 2+ on the right side, and 2+ on the left side.  Pulmonary/Chest: Effort normal. No respiratory distress. She has decreased breath sounds (bibasilarly). She has no wheezes. She has no rhonchi. She has no rales.  Bibasilar crackles, don't clear with inspiration  Abdominal: Soft. Bowel sounds are normal. She exhibits no distension and no mass. There is no tenderness. There is no rebound and no guarding.  Musculoskeletal: Normal range of motion. She exhibits no edema.  Lymphadenopathy:    She has no cervical adenopathy.  Neurological: She is alert and oriented to person, place, and time.  CN grossly intact, station and gait intact  Skin: Skin is warm and dry. No rash noted.  Psychiatric: She has a normal mood and affect. Her  behavior is normal. Judgment and thought content normal.  Nursing note and vitals reviewed.  Results for orders placed or performed in visit on 03/28/18  Lipid panel  Result Value Ref Range   Cholesterol 141 0 - 200 mg/dL   Triglycerides 57.0 0.0 - 149.0 mg/dL   HDL 67.00 >39.00 mg/dL   VLDL 11.4 0.0 - 40.0 mg/dL   LDL Cholesterol 63 0 - 99 mg/dL   Total CHOL/HDL Ratio 2    NonHDL 73.91   VITAMIN D 25 Hydroxy (Vit-D Deficiency, Fractures)  Result Value Ref Range   VITD 15.61 (L) 30.00 - 100.00 ng/mL  Vitamin B12  Result Value Ref Range   Vitamin B-12 379 211 - 911 pg/mL      Assessment & Plan:   Problem List Items Addressed This Visit    Vitamin D deficiency    Low readings - will restart 50,000 IU weekly. May need indefinitely. Recheck at 6 mo f/u visit.       Thoracic aorta atherosclerosis ([place])    Found incidentally on CXR. Continue statin.      Relevant Medications   atorvastatin (LIPITOR) 10 MG tablet    lisinopril-hydrochlorothiazide (PRINZIDE,ZESTORETIC) 10-12.5 MG tablet   Right-sided chest wall pain    This has largely resolved.       Osteoporosis    Continue fosamax, restart vit D weekly Rx strength.       Relevant Medications   Cholecalciferol (VITAMIN D3) 50000 units TABS   Insomnia    Longstanding. Continue sparing ambien use.       Hypertension    Chronic, stable continue lisinopril hctz       Relevant Medications   atorvastatin (LIPITOR) 10 MG tablet   lisinopril-hydrochlorothiazide (PRINZIDE,ZESTORETIC) 10-12.5 MG tablet   Hyperlipidemia    Chronic, stable on low dose atorvastatin. The 10-year ASCVD risk score Mikey Bussing DC Brooke Bonito., et al., 2013) is: 9.7%   Values used to calculate the score:     Age: 73 years     Sex: Female     Is Non-Hispanic African American: No     Diabetic: No     Tobacco smoker: No     Systolic Blood Pressure: 102 mmHg     Is BP treated: Yes     HDL Cholesterol: 67 mg/dL     Total Cholesterol: 141 mg/dL       Relevant Medications   atorvastatin (LIPITOR) 10 MG tablet   lisinopril-hydrochlorothiazide (PRINZIDE,ZESTORETIC) 10-12.5 MG tablet   Health care maintenance - Primary    Preventative protocols reviewed and updated unless pt declined. Discussed healthy diet and lifestyle.       Emphysema of lung (H[facility])    Persistent bibasilar crackles on exam today. Xray from last month was reassuring. Denies significant symptoms of dyspnea or cough. Will continue to monitor.       Relevant Medications   budesonide-formoterol (SYMBICORT) 80-4.5 MCG/ACT inhaler   B12 deficiency    No recent shots, not taking supplement - advised start oral supplementation.       Advanced care planning/counseling discussion    Advanced directives: has at home. HCPOA is daughter [name]. To bring me copy of living will.      Adjustment disorder with depressed mood    Improved on cymbalta - continue this.           Meds ordered this encounter  Medications    . atorvastatin (LIPITOR) 10 MG tablet    Sig: Take one tablet oral daily  Dispense:  90 tablet    Refill:  3  . lisinopril-hydrochlorothiazide (PRINZIDE,ZESTORETIC) 10-12.5 MG tablet    Sig: Take 1 tablet by mouth daily.    Dispense:  90 tablet    Refill:  3  . budesonide-formoterol (SYMBICORT) 80-4.5 MCG/ACT inhaler    Sig: Inhale 1 puff into the lungs 2 (two) times daily.    Dispense:  3 Inhaler    Refill:  3  . Cholecalciferol (VITAMIN D3) 50000 units TABS    Sig: Take 1 tablet by mouth once a week.    Dispense:  12 tablet    Refill:  1   No orders of the defined types were placed in this encounter.   Follow up plan: Return in about 6 months (around 10/09/2018) for follow up visit.  Ria Bush, MD

## 2018-04-10 NOTE — Assessment & Plan Note (Addendum)
Low readings - will restart 50,000 IU weekly. May need indefinitely. Recheck at 6 mo f/u visit.

## 2018-04-10 NOTE — Assessment & Plan Note (Signed)
Advanced directives: has at home. HCPOA is daughter Samantha. To bring me copy of living will.  ?

## 2018-04-10 NOTE — Assessment & Plan Note (Signed)
Preventative protocols reviewed and updated unless pt declined. Discussed healthy diet and lifestyle.  

## 2018-04-10 NOTE — Assessment & Plan Note (Signed)
No recent shots, not taking supplement - advised start oral supplementation.

## 2018-04-11 NOTE — Assessment & Plan Note (Signed)
This has largely resolved.  

## 2018-04-11 NOTE — Assessment & Plan Note (Signed)
Persistent bibasilar crackles on exam today. Xray from last month was reassuring. Denies significant symptoms of dyspnea or cough. Will continue to monitor.

## 2018-04-11 NOTE — Assessment & Plan Note (Signed)
Longstanding. Continue sparing ambien use.

## 2018-04-11 NOTE — Assessment & Plan Note (Signed)
Improved on cymbalta - continue this.

## 2018-04-11 NOTE — Assessment & Plan Note (Signed)
Found incidentally on CXR. Continue statin.

## 2018-04-11 NOTE — Assessment & Plan Note (Signed)
Continue fosamax, restart vit D weekly Rx strength.

## 2018-04-11 NOTE — Assessment & Plan Note (Signed)
Chronic, stable on low dose atorvastatin. The 10-year ASCVD risk score Mikey Bussing DC Brooke Bonito., et al., 2013) is: 9.7%   Values used to calculate the score:     Age: 72 years     Sex: Female     Is Non-Hispanic African American: No     Diabetic: No     Tobacco smoker: No     Systolic Blood Pressure: 747 mmHg     Is BP treated: Yes     HDL Cholesterol: 67 mg/dL     Total Cholesterol: 141 mg/dL

## 2018-04-11 NOTE — Assessment & Plan Note (Signed)
Chronic, stable continue lisinopril hctz

## 2018-04-18 ENCOUNTER — Ambulatory Visit: Payer: Self-pay

## 2018-04-18 MED ORDER — ZOLPIDEM TARTRATE 10 MG PO TABS: 5.0000 mg | ORAL_TABLET | Freq: Every evening | ORAL | 0 refills | Status: DC | PRN

## 2018-04-18 NOTE — Telephone Encounter (Signed)
Recommend she stop trazodone. Can take ambien, recommend 5mg  at a time - sent to pharmacy.  Will discuss other options at future visits.

## 2018-04-18 NOTE — Telephone Encounter (Signed)
Per call dated 04/18/18 "Patient called in with concerns about her medication traZODone (DESYREL) 50 MG tablet , patient says she is having nightmares, dizziness, and wakes up with headaches every morning. Patient would like to be switched back to what she was taking prior". The pt says that she feels these effects within 5 minutes of taking the medication; she says that she would like to take ambien instead; pt was seen in office 04/10/18; will route to office for final disposition.  Reason for Disposition . [name] has NON-URGENT medication question about med that PCP prescribed and triager unable to answer question  Answer Assessment - Initial Assessment Questions 1. SYMPTOMS: "Do you have any symptoms?"     Yes, Patient called in with concerns about her medication traZODone (DESYREL) 50 MG tablet , patient says she is having nightmares, dizziness, and wakes up with headaches every morning. Patient would like to be switched back to what she was taking prior.  2. SEVERITY: If symptoms are present, ask "Are they mild, moderate or severe?"   moderate  Protocols used: MEDICATION QUESTION CALL-A-AH

## 2018-04-18 NOTE — Addendum Note (Signed)
Addended by: Ria Bush on: 04/18/2018 07:56 PM   Modules accepted: Orders

## 2018-04-19 NOTE — Telephone Encounter (Signed)
Spoke with pt relaying Dr. Synthia Innocent instructions and message. Pt verbalizes understanding and expresses her appreciation.

## 2018-05-21 ENCOUNTER — Other Ambulatory Visit: Payer: Self-pay | Admitting: Family Medicine

## 2018-05-21 NOTE — Telephone Encounter (Signed)
Name of Medication: Ambien Name of Pharmacy: [facility] or Written Date and Quantity: 04/19/18, #30 Last Office Visit and Type: 04/10/18, CPE Next Office Visit and Type: 04/16/19, CPE Last Controlled Substance Agreement Date: none Last UDS: none

## 2018-05-22 ENCOUNTER — Other Ambulatory Visit: Payer: Self-pay | Admitting: Family Medicine

## 2018-05-22 MED ORDER — CETIRIZINE HCL 10 MG PO TABS: 10.0000 mg | ORAL_TABLET | Freq: Every day | ORAL | 3 refills | Status: DC

## 2018-05-22 NOTE — Telephone Encounter (Signed)
Pt had annual exam on 04/10/18. Refilled per protocal

## 2018-05-22 NOTE — Telephone Encounter (Signed)
Copied from [place] [id number]. Topic: Quick Communication - Rx Refill/Question >> May 22, 2018  9:18 AM Judyann Munson wrote: Medication: cetirizine (ZYRTEC) 10 MG tablet [[id number]]    Preferred Pharmacy (with phone number or street name): [place], [place] - [address] [phone] (Phone) [phone] (Fax)    Agent: Please be advised that RX refills may take up to 3 business days. We ask that you follow-up with your pharmacy.

## 2018-05-22 NOTE — Telephone Encounter (Signed)
Eprescribed.

## 2018-06-21 ENCOUNTER — Other Ambulatory Visit: Payer: Self-pay | Admitting: Family Medicine

## 2018-06-22 NOTE — Telephone Encounter (Signed)
Name of Medication:  Ambien Name of Pharmacy: [facility] Last Fill or Written Date and Quantity: 05/22/18, #30/0 Last Office Visit and Type: 04/10/18, CPE Next Office Visit and Type: 04/16/19, CPE Last Controlled Substance Agreement Date: none Last UDS: none

## 2018-06-25 NOTE — Telephone Encounter (Signed)
Eprescribed.

## 2018-07-23 ENCOUNTER — Telehealth: Payer: Self-pay | Admitting: Family Medicine

## 2018-07-23 MED ORDER — ZOLPIDEM TARTRATE 10 MG PO TABS: 5.0000 mg | ORAL_TABLET | Freq: Every evening | ORAL | 0 refills | Status: DC | PRN

## 2018-07-23 NOTE — Telephone Encounter (Signed)
Eprescribed.

## 2018-07-23 NOTE — Telephone Encounter (Signed)
Name of Medication: Ambien Name of Pharmacy: [facility] or Written Date and Quantity: 06/25/18, #30 Last Office Visit and Type: 04/10/18, CPE Next Office Visit and Type: 04/16/19, CPE Last Controlled Substance Agreement Date: none Last UDS: none

## 2018-07-23 NOTE — Telephone Encounter (Signed)
Pt has switch pharmacy and [facility] pt need a refill for zolpidem 10 mg   Sent to [place]

## 2018-08-17 DIAGNOSIS — C44319 Basal cell carcinoma of skin of other parts of face: Secondary | ICD-10-CM | POA: Diagnosis not present

## 2018-08-17 DIAGNOSIS — Z85828 Personal history of other malignant neoplasm of skin: Secondary | ICD-10-CM | POA: Diagnosis not present

## 2018-08-17 DIAGNOSIS — L821 Other seborrheic keratosis: Secondary | ICD-10-CM | POA: Diagnosis not present

## 2018-08-17 DIAGNOSIS — L570 Actinic keratosis: Secondary | ICD-10-CM | POA: Diagnosis not present

## 2018-08-17 DIAGNOSIS — D485 Neoplasm of uncertain behavior of skin: Secondary | ICD-10-CM | POA: Diagnosis not present

## 2018-08-17 DIAGNOSIS — Z08 Encounter for follow-up examination after completed treatment for malignant neoplasm: Secondary | ICD-10-CM | POA: Diagnosis not present

## 2018-08-17 DIAGNOSIS — X32XXXA Exposure to sunlight, initial encounter: Secondary | ICD-10-CM | POA: Diagnosis not present

## 2018-08-24 ENCOUNTER — Other Ambulatory Visit: Payer: Self-pay | Admitting: *Deleted

## 2018-08-24 NOTE — Telephone Encounter (Signed)
Last Rx 07/23/2018 #30. Last OV 04/2018.

## 2018-08-25 ENCOUNTER — Other Ambulatory Visit: Payer: Self-pay | Admitting: Family Medicine

## 2018-08-25 NOTE — Telephone Encounter (Signed)
Eprescribed.

## 2018-09-14 ENCOUNTER — Other Ambulatory Visit: Payer: Self-pay

## 2018-09-14 ENCOUNTER — Encounter: Payer: Self-pay | Admitting: Internal Medicine

## 2018-09-14 ENCOUNTER — Ambulatory Visit (INDEPENDENT_AMBULATORY_CARE_PROVIDER_SITE_OTHER): Payer: Medicare HMO | Admitting: Internal Medicine

## 2018-09-14 VITALS — BP 116/66 | HR 89 | Temp 98.0°F | Resp 18 | Ht 58.5 in | Wt 126.0 lb

## 2018-09-14 DIAGNOSIS — J441 Chronic obstructive pulmonary disease with (acute) exacerbation: Secondary | ICD-10-CM | POA: Diagnosis not present

## 2018-09-14 MED ORDER — PREDNISONE 20 MG PO TABS: 40.0000 mg | ORAL_TABLET | Freq: Every day | ORAL | 0 refills | Status: DC

## 2018-09-14 MED ORDER — DOXYCYCLINE HYCLATE 100 MG PO TABS: 100.0000 mg | ORAL_TABLET | Freq: Two times a day (BID) | ORAL | 0 refills | Status: DC

## 2018-09-14 MED ORDER — FLUCONAZOLE 150 MG PO TABS: 150.0000 mg | ORAL_TABLET | Freq: Once | ORAL | 1 refills | Status: AC

## 2018-09-14 NOTE — Assessment & Plan Note (Signed)
Discussed starting the symbicort at the start of respiratory infection Will give prednisone burst Albuterol prn Doxy just in case Fluconazole for propensity to vaginal yeast

## 2018-09-14 NOTE — Progress Notes (Signed)
Subjective:    Patient ID: Courtney Thomas, female    DOB: 05/13/1946, 73 y.o.   MRN: [id number]  HPI Here due to respiratory symptoms  Started a week ago Suddenly awoke with cough and is worsening Very tired Keeping her up Still dry Low grade fever with chills. No sweats Some SOB---with the cough (not regular with her symbicort but is using the albuterol)  No sore throat No head congestion No ear pain Symptoms all in chest  Tried advil, cough drops, zycam---not clearly helpful  Current Outpatient Medications on File Prior to Visit  Medication Sig Dispense Refill  . alendronate (FOSAMAX) 70 MG tablet Take 1 tablet (70 mg total) by mouth every 7 (seven) days. Take with a full glass of water on an empty stomach. 12 tablet 3  . atorvastatin (LIPITOR) 10 MG tablet Take one tablet oral daily 90 tablet 3  . budesonide-formoterol (SYMBICORT) 80-4.5 MCG/ACT inhaler Inhale 1 puff into the lungs 2 (two) times daily. 3 Inhaler 3  . buPROPion (WELLBUTRIN SR) 150 MG 12 hr tablet Take 1 tablet (150 mg total) by mouth daily. 30 tablet 4  . Calcium Carbonate-Vitamin D (CALCIUM 600/VITAMIN D) 600-400 MG-UNIT chew tablet Chew 1 tablet by mouth daily.    . cetirizine (ZYRTEC) 10 MG tablet Take 1 tablet (10 mg total) by mouth daily. 90 tablet 3  . Cholecalciferol (VITAMIN D3) 50000 units TABS Take 1 tablet by mouth once a week. 12 tablet 1  . cyanocobalamin (,VITAMIN B-12,) 1000 MCG/ML injection Inject 1,000 mcg into the muscle every 30 (thirty) days.    . Cyanocobalamin (VITAMIN B-12) 1000 MCG SUBL Place 1 tablet (1,000 mcg total) under the tongue daily.  0  . DULoxetine (CYMBALTA) 60 MG capsule Take 1 capsule (60 mg total) by mouth daily. 90 capsule 3  . ibuprofen (ADVIL,MOTRIN) 400 MG tablet Take 1.5 tablets (600 mg total) by mouth every 6 (six) hours as needed for moderate pain. (Patient taking differently: Take 200 mg by mouth every 6 (six) hours as needed for moderate pain. ) 20 tablet 0  .  lisinopril-hydrochlorothiazide (PRINZIDE,ZESTORETIC) 10-12.5 MG tablet Take 1 tablet by mouth daily. 90 tablet 3  . Vitamin D, Ergocalciferol, (DRISDOL) 50000 units CAPS capsule Take 1 capsule (50,000 Units total) by mouth every 7 (seven) days. 12 capsule 3  . zolpidem (AMBIEN) 10 MG tablet TAKE 1/2 TO 1 TABLET AT BEDTIME AS NEEDED FOR SLEEP 30 tablet 0   No current facility-administered medications on file prior to visit.     Allergies  Allergen Reactions  . Eggs Or Egg-Derived Products Anaphylaxis and Swelling  . Codeine Nausea Only    Past Medical History:  Diagnosis Date  . Adjustment disorder with depressed mood   . Asthma   . B12 deficiency anemia 2014   mild  . Frequent headaches   . History of chicken pox   . Hyperlipidemia   . Hypertension   . Insomnia   . 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 Seasonal allergic rhinitis     Past Surgical History:  Procedure Laterality Date  . ABDOMINAL HYSTERECTOMY    . BREAST CYST ASPIRATION Left 80s   benign  . BREAST EXCISIONAL BIOPSY Right 1971   neg  . COLONOSCOPY  07/2013   WNL per patient ([place])  . dexa  03/2014   T score spine -3.5, hip -2.0  . NASAL SINUS SURGERY  deviated septum  . TONSILLECTOMY  1970  . TOTAL ABDOMINAL HYSTERECTOMY W/ BILATERAL SALPINGOOPHORECTOMY  1994   uterine cyst with heavy bleeding    Family History  Problem Relation Age of Onset  . Cancer Mother 29       lung met to brain, smoker  . Stroke Mother   . Alzheimer's disease Mother   . Arthritis Father   . Arthritis Sister   . Cancer Brother 82       pancreatic  . Diabetes Maternal Grandmother   . Breast cancer Neg Hx     Social History   Socioeconomic History  . Marital status: Married    Spouse name: Not on file  . Number of children: Not on file  . Years of education: Not on file  . Highest education level: Not on file  Occupational History  . Not on file    Social Needs  . Financial resource strain: Not on file  . Food insecurity:    Worry: Not on file    Inability: Not on file  . Transportation needs:    Medical: Not on file    Non-medical: Not on file  Tobacco Use  . Smoking status: Former Research scientist (life sciences)  . Smokeless tobacco: Never Used  . Tobacco comment: minimal smoking as a teenager  Substance and Sexual Activity  . Alcohol use: No  . Drug use: No  . Sexual activity: Never  Lifestyle  . Physical activity:    Days per week: Not on file    Minutes per session: Not on file  . Stress: Not on file  Relationships  . Social connections:    Talks on phone: Not on file    Gets together: Not on file    Attends religious service: Not on file    Active member of club or organization: Not on file    Attends meetings of clubs or organizations: Not on file    Relationship status: Not on file  . Intimate partner violence:    Fear of current or ex partner: Not on file    Emotionally abused: Not on file    Physically abused: Not on file    Forced sexual activity: Not on file  Other Topics Concern  . Not on file  Social History Narrative   Lives alone - husband passed away 10-06-13. 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 No distress.  HENT:  Mouth/Throat: Oropharynx is clear and moist. No oropharyngeal exudate.  No sinus tenderness Mild nasal congestion --some green mucus on right TMs normal  Neck: No thyromegaly present.  Respiratory: Effort normal. She has no rales.  Slightly decreased breath sounds Mild prolonged expiratory phase Mild exp rhonchi and wheeze  Lymphadenopathy:    She has no cervical adenopathy.            Assessment & Plan:

## 2018-09-27 ENCOUNTER — Other Ambulatory Visit: Payer: Self-pay | Admitting: Family Medicine

## 2018-09-28 NOTE — Telephone Encounter (Signed)
Eprescribed.

## 2018-09-28 NOTE — Telephone Encounter (Signed)
Name of Medication: Ambien Name of Pharmacy: [place] or Written Date and Quantity: 08/25/18, #30 Last Office Visit and Type: 09/14/18, acute w/ Dr. Silvio Pate Next Office Visit and Type: 04/16/19, CPE Pt 2 Last Controlled Substance Agreement Date: none Last UDS: none

## 2018-10-09 DIAGNOSIS — R062 Wheezing: Secondary | ICD-10-CM | POA: Diagnosis not present

## 2018-10-09 DIAGNOSIS — J40 Bronchitis, not specified as acute or chronic: Secondary | ICD-10-CM | POA: Diagnosis not present

## 2018-10-17 ENCOUNTER — Other Ambulatory Visit: Payer: Self-pay | Admitting: Family Medicine

## 2018-10-17 DIAGNOSIS — C44319 Basal cell carcinoma of skin of other parts of face: Secondary | ICD-10-CM | POA: Diagnosis not present

## 2018-11-27 ENCOUNTER — Ambulatory Visit (INDEPENDENT_AMBULATORY_CARE_PROVIDER_SITE_OTHER): Payer: Medicare HMO | Admitting: Family Medicine

## 2018-11-27 ENCOUNTER — Telehealth: Payer: Self-pay | Admitting: Family Medicine

## 2018-11-27 ENCOUNTER — Encounter: Payer: Self-pay | Admitting: Family Medicine

## 2018-11-27 VITALS — BP 119/61 | HR 93 | Temp 99.6°F | Ht 58.5 in

## 2018-11-27 DIAGNOSIS — J439 Emphysema, unspecified: Secondary | ICD-10-CM | POA: Diagnosis not present

## 2018-11-27 DIAGNOSIS — J029 Acute pharyngitis, unspecified: Secondary | ICD-10-CM | POA: Diagnosis not present

## 2018-11-27 DIAGNOSIS — J22 Unspecified acute lower respiratory infection: Secondary | ICD-10-CM

## 2018-11-27 NOTE — Telephone Encounter (Signed)
Work note mailed to pt.

## 2018-11-27 NOTE — Assessment & Plan Note (Signed)
No wheezing or respiratory distress over phone - doubt copd exacerbation at this time.

## 2018-11-27 NOTE — Progress Notes (Signed)
Virtual visit completed through [employer]. Due to national recommendations of social distancing due to Harper 19, a virtual visit is felt to be most appropriate for this patient at this time. Interactive audio and video telecommunications were attempted between myself and Courtney Thomas, however failed due to patient not having access to video capability. We continued and completed visit with audio only (telephone). 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   BP 119/61 (BP Location: Left Arm, Patient Position: Sitting, Cuff Size: Normal)   Pulse 93   Temp 99.6 F (37.6 C) (Oral)   Ht 4' 10.5" (1.486 m)   BMI 25.89 kg/m    CC: ST, fever Subjective:    Patient ID: Courtney Thomas, female    DOB: [birth year], 73 y.o.   MRN: [id number]  HPI: Courtney Thomas is a 73 y.o. female presenting on 11/27/2018 for Sore Throat (C/o sore throat, fever-max 100.4, sore gums, sore areas on tongue and muscule weakness. Sxs started 2 days )   Fever started yesterday 100.4 associated with ST and trouble swallowing, tongue and gum soreness, generalized weakness, chills. White sores on tongue and gums. Ongoing chronic cough. Feels very tight in back of throat. Some headache and mild earache. Appetite down. Sense of taste diminished. Unsure if smell affected. Some nausea.   Treating at home with aleve. Has been using albuterol inhaler as well. Treating sores with peroxide.  No myalgias, body aches, dyspnea. No PNdrainage. No wheezing, tooth pain. No significant head or chest congestion. No diarrhea. No urinary changes or bowel changes.  No sick contacts at home.   Seen 09/2018 for COPD flare treated with prednisone, doxy, symbicort and albuterol.  Seen 09/2018 at [facility] for bronchitis, treated again with prednisone and doxy course.   S/p tonsillectomy.  Has continued going to work. 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      Relevant past medical, surgical, family and social history reviewed and updated as indicated. Interim medical history since our last visit reviewed. Allergies and medications reviewed and updated. Outpatient Medications Prior to Visit  Medication Sig Dispense Refill  . alendronate (FOSAMAX) 70 MG tablet Take 1 tablet (70 mg total) by mouth every 7 (seven) days. Take with a full glass of water on an empty stomach. 12 tablet 3  . atorvastatin (LIPITOR) 10 MG tablet Take one tablet oral daily 90 tablet 3  . budesonide-formoterol (SYMBICORT) 80-4.5 MCG/ACT inhaler Inhale 1 puff into the lungs 2 (two) times daily. 3 Inhaler 3  . buPROPion (WELLBUTRIN SR) 150 MG 12 hr tablet TAKE ONE TABLET BY MOUTH EVERY DAY 30 tablet 5  . Calcium Carbonate-Vitamin D (CALCIUM 600/VITAMIN D) 600-400 MG-UNIT chew tablet Chew 1 tablet by mouth daily.    . cetirizine (ZYRTEC) 10 MG tablet Take 1 tablet (10 mg total) by mouth daily. 90 tablet 3  . Cholecalciferol (VITAMIN D3) 50000 units TABS Take 1 tablet by mouth once a week. 12 tablet 1  . cyanocobalamin (,VITAMIN B-12,) 1000 MCG/ML injection Inject 1,000 mcg into the muscle every 30 (thirty) days.    . Cyanocobalamin (VITAMIN B-12) 1000 MCG SUBL Place 1 tablet (1,000 mcg total) under the tongue daily.  0  .  DULoxetine (CYMBALTA) 60 MG capsule Take 1 capsule (60 mg total) by mouth daily. 90 capsule 3  . ibuprofen (ADVIL,MOTRIN) 400 MG tablet Take 1.5 tablets (600 mg total) by mouth every 6 (six) hours as needed for moderate pain. (Patient taking differently: Take 200 mg by mouth every 6 (six) hours as needed for moderate pain. ) 20 tablet 0  . lisinopril-hydrochlorothiazide (PRINZIDE,ZESTORETIC) 10-12.5 MG tablet Take 1 tablet by mouth daily. 90 tablet  3  . predniSONE (DELTASONE) 20 MG tablet Take 2 tablets (40 mg total) by mouth daily. For 3 days, then 1 tab daily for 3 days 9 tablet 0  . Vitamin D, Ergocalciferol, (DRISDOL) 50000 units CAPS capsule Take 1 capsule (50,000 Units total) by mouth every 7 (seven) days. 12 capsule 3  . zolpidem (AMBIEN) 10 MG tablet TAKE 1/2 TO 1 TABLET BY MOUTH AT BEDTIMEAS NEEDED FOR SLEEP 30 tablet 3  . albuterol (VENTOLIN HFA) 108 (90 Base) MCG/ACT inhaler Inhale 2 puffs into the lungs every 6 (six) hours as needed for wheezing or shortness of breath.    . doxycycline (VIBRA-TABS) 100 MG tablet Take 1 tablet (100 mg total) by mouth 2 (two) times daily. 14 tablet 0   No facility-administered medications prior to visit.      Per HPI unless specifically indicated in ROS section below Review of Systems Objective:    BP 119/61 (BP Location: Left Arm, Patient Position: Sitting, Cuff Size: Normal)   Pulse 93   Temp 99.6 F (37.6 C) (Oral)   Ht 4' 10.5" (1.486 m)   BMI 25.89 kg/m   Wt Readings from Last 3 Encounters:  09/14/18 126 lb (57.2 kg)  04/10/18 124 lb (56.2 kg)  03/28/18 126 lb 4 oz (57.3 kg)     Physical exam: Pulm: speaks in complete sentences without increased work of breathing Psych: normal mood, normal thought content       Assessment & Plan:   Problem List Items Addressed This Visit    Emphysema of lung ([place])    No wheezing or respiratory distress over phone - doubt copd exacerbation at this time.       Relevant Medications   albuterol (VENTOLIN HFA) 108 (90 Base) MCG/ACT inhaler   Acute respiratory infection - Primary    Constellation of symptoms suggesting viral respiratory infection, in setting of current Covid19 concern for this. Recommended out of work for at least 7 days, reviewed 3 step criteria to be able to return to work. Will monitor closely, letter for work provided today.        Other Visit Diagnoses    Sore throat           No orders of the defined types were  placed in this encounter.  No orders of the defined types were placed in this encounter.   Follow up plan: No follow-ups on file.  Ria Bush, MD

## 2018-11-27 NOTE — Assessment & Plan Note (Signed)
Constellation of symptoms suggesting viral respiratory infection, in setting of current Covid19 concern for this. Recommended out of work for at least 7 days, reviewed 3 step criteria to be able to return to work. Will monitor closely, letter for work provided today.

## 2018-11-27 NOTE — Telephone Encounter (Signed)
Seen Tuesday with URI sxs. Recommended out of work through Monday.  plz call on Thursday for an update on symptoms.

## 2018-11-30 ENCOUNTER — Telehealth: Payer: Self-pay

## 2018-11-30 NOTE — Telephone Encounter (Signed)
Called and spoke with pt. Pt advised and voiced understanding.  

## 2018-11-30 NOTE — Telephone Encounter (Signed)
Pt left v/m that she has not heard if she can return to work on Mon. May 4,2020. Pt request cb  Today.pt was seen on 11/27/18.

## 2018-11-30 NOTE — Telephone Encounter (Signed)
Patient can return to work once below criteria has been met: . at least 7 days since symptoms onset . AND 3 days fever free without antipyretics (Tylenol or Ibuprofen) . AND improvement in respiratory symptoms.

## 2018-11-30 NOTE — Telephone Encounter (Signed)
Spoke with pt informing her Dr. Darnell Level has her out of work through Mon, 12/03/18 and that her work note is in the mail to her.  Verbalizes understanding and expresses her thanks for the call.

## 2018-11-30 NOTE — Telephone Encounter (Signed)
Sent to PCP to advise last OV 11/27/2018 please advise

## 2018-12-12 ENCOUNTER — Telehealth: Payer: Self-pay

## 2018-12-12 NOTE — Telephone Encounter (Signed)
Spoke with pt asking how she was doing with URI sxs she had been experiencing.  States she is doing much better. Pt expresses her thanks for the call.

## 2018-12-12 NOTE — Telephone Encounter (Signed)
Noted. Thanks.

## 2019-01-18 ENCOUNTER — Other Ambulatory Visit: Payer: Self-pay | Admitting: Family Medicine

## 2019-01-24 ENCOUNTER — Other Ambulatory Visit: Payer: Self-pay | Admitting: Family Medicine

## 2019-01-28 ENCOUNTER — Other Ambulatory Visit: Payer: Self-pay | Admitting: Family Medicine

## 2019-01-28 NOTE — Telephone Encounter (Signed)
Eprescribed.

## 2019-02-27 ENCOUNTER — Other Ambulatory Visit: Payer: Self-pay | Admitting: Family Medicine

## 2019-02-27 NOTE — Telephone Encounter (Signed)
Name of Medication: Ambien Name of Pharmacy: [facility] or Written Date and Quantity: 01/28/19, #30 Last Office Visit and Type: 11/27/18, acute Next Office Visit and Type: 04/16/19, CPE Pt 2 Last Controlled Substance Agreement Date: none Last UDS: none

## 2019-02-28 NOTE — Telephone Encounter (Signed)
Eprescribed.

## 2019-04-01 ENCOUNTER — Other Ambulatory Visit: Payer: Self-pay | Admitting: Family Medicine

## 2019-04-01 NOTE — Telephone Encounter (Signed)
Last filled on 02/28/2019 with 0 refills. LOV 11/27/2018, next appointment 04/16/2019

## 2019-04-02 NOTE — Telephone Encounter (Signed)
Eprescribed.

## 2019-04-04 ENCOUNTER — Other Ambulatory Visit: Payer: Self-pay | Admitting: Family Medicine

## 2019-04-04 ENCOUNTER — Ambulatory Visit: Payer: Medicare HMO

## 2019-04-04 DIAGNOSIS — E538 Deficiency of other specified B group vitamins: Secondary | ICD-10-CM

## 2019-04-04 DIAGNOSIS — E559 Vitamin D deficiency, unspecified: Secondary | ICD-10-CM

## 2019-04-04 DIAGNOSIS — D649 Anemia, unspecified: Secondary | ICD-10-CM

## 2019-04-04 DIAGNOSIS — E785 Hyperlipidemia, unspecified: Secondary | ICD-10-CM

## 2019-04-05 ENCOUNTER — Other Ambulatory Visit: Payer: Self-pay | Admitting: Family Medicine

## 2019-04-05 ENCOUNTER — Ambulatory Visit (INDEPENDENT_AMBULATORY_CARE_PROVIDER_SITE_OTHER): Payer: Medicare HMO

## 2019-04-05 ENCOUNTER — Other Ambulatory Visit (INDEPENDENT_AMBULATORY_CARE_PROVIDER_SITE_OTHER): Payer: Medicare HMO

## 2019-04-05 VITALS — BP 105/65 | HR 80 | Ht 59.5 in | Wt 122.0 lb

## 2019-04-05 DIAGNOSIS — D649 Anemia, unspecified: Secondary | ICD-10-CM | POA: Diagnosis not present

## 2019-04-05 DIAGNOSIS — E559 Vitamin D deficiency, unspecified: Secondary | ICD-10-CM

## 2019-04-05 DIAGNOSIS — Z0000 Encounter for general adult medical examination without abnormal findings: Secondary | ICD-10-CM

## 2019-04-05 DIAGNOSIS — E538 Deficiency of other specified B group vitamins: Secondary | ICD-10-CM | POA: Diagnosis not present

## 2019-04-05 DIAGNOSIS — E785 Hyperlipidemia, unspecified: Secondary | ICD-10-CM

## 2019-04-05 DIAGNOSIS — Z1231 Encounter for screening mammogram for malignant neoplasm of breast: Secondary | ICD-10-CM

## 2019-04-05 LAB — COMPREHENSIVE METABOLIC PANEL
ALT: 12 U/L (ref 0–35)
AST: 9 U/L (ref 0–37)
Albumin: 3.9 g/dL (ref 3.5–5.2)
Alkaline Phosphatase: 82 U/L (ref 39–117)
BUN: 28 mg/dL — ABNORMAL HIGH (ref 6–23)
CO2: 30 mEq/L (ref 19–32)
Calcium: 9.1 mg/dL (ref 8.4–10.5)
Chloride: 103 mEq/L (ref 96–112)
Creatinine, Ser: 0.91 mg/dL (ref 0.40–1.20)
GFR: 60.83 mL/min (ref >60.00)
Glucose, Bld: 87 mg/dL (ref 70–99)
Potassium: 3.7 mEq/L (ref 3.5–5.1)
Sodium: 140 mEq/L (ref 135–145)
Total Bilirubin: 0.4 mg/dL (ref 0.2–1.2)
Total Protein: 6.5 g/dL (ref 6.0–8.3)

## 2019-04-05 LAB — CBC WITH DIFFERENTIAL/PLATELET
Basophils Absolute: 0 10*3/uL (ref 0.0–0.1)
Basophils Relative: 0.7 % (ref 0.0–3.0)
Eosinophils Absolute: 0.4 10*3/uL (ref 0.0–0.7)
Eosinophils Relative: 6.2 % — ABNORMAL HIGH (ref 0.0–5.0)
HCT: 36.9 % (ref 36.0–46.0)
Hemoglobin: 12 g/dL (ref 12.0–15.0)
Lymphocytes Relative: 26.4 % (ref 12.0–46.0)
Lymphs Abs: 1.7 10*3/uL (ref 0.7–4.0)
MCHC: 32.6 g/dL (ref 30.0–36.0)
MCV: 88.9 fl (ref 78.0–100.0)
Monocytes Absolute: 0.5 10*3/uL (ref 0.1–1.0)
Monocytes Relative: 8.2 % (ref 3.0–12.0)
Neutro Abs: 3.8 10*3/uL (ref 1.4–7.7)
Neutrophils Relative %: 58.5 % (ref 43.0–77.0)
Platelets: 246 10*3/uL (ref 150.0–400.0)
RBC: 4.15 Mil/uL (ref 3.87–5.11)
RDW: 13.9 % (ref 11.5–15.5)
WBC: 6.5 10*3/uL (ref 4.0–10.5)

## 2019-04-05 LAB — LIPID PANEL
Cholesterol: 142 mg/dL (ref 0–200)
HDL: 66.9 mg/dL (ref >39.00)
LDL Cholesterol: 65 mg/dL (ref 0–99)
NonHDL: 75.25
Total CHOL/HDL Ratio: 2
Triglycerides: 50 mg/dL (ref 0.0–149.0)
VLDL: 10 mg/dL (ref 0.0–40.0)

## 2019-04-05 LAB — VITAMIN D 25 HYDROXY (VIT D DEFICIENCY, FRACTURES): VITD: 11.52 ng/mL — ABNORMAL LOW (ref 30.00–100.00)

## 2019-04-05 LAB — VITAMIN B12: Vitamin B-12: 178 pg/mL — ABNORMAL LOW (ref 211–911)

## 2019-04-05 NOTE — Progress Notes (Signed)
PCP notes:  Health Maintenance:  Flu vaccine is due. Needs egg free formulation.  Abnormal Screenings:  None  Patient concerns:  None  Nurse concerns:  None  Next PCP appt.: 04/16/2019 at 4:00

## 2019-04-05 NOTE — Progress Notes (Signed)
Subjective:   Courtney Thomas is a 73 y.o. female who presents for Medicare Annual (Subsequent) preventive examination.  This visit type was conducted due to national recommendations for restrictions regarding the COVID-19 Pandemic (e.g. social distancing). This format is felt to be most appropriate for this patient at this time. All issues noted in this document were discussed and addressed. No physical exam was performed (except for noted visual exam findings with Video Visits). This patient, [name], has given permission to perform this visit via telephone. Vital signs may be absent or patient reported.  Patient location:  At home  Nurse location:  At home     Review of Systems:  n/a Cardiac Risk Factors include: advanced age (>19mn, >>29women);dyslipidemia;hypertension;sedentary lifestyle     Objective:     Vitals: BP 105/65 Comment: per patient  Pulse 80 Comment: per patient  Ht 4' 11.5" (1.511 m) Comment: per patient  Wt 122 lb (55.3 kg) Comment: per patient  BMI 24.23 kg/m   Body mass index is 24.23 kg/m.  Advanced Directives 04/05/2019 03/28/2018 03/27/2017 03/25/2016 12/06/2015  Does Patient Have a Medical Advance Directive? _0   Type of Advance Directive Living will [place] Living will;Healthcare [place] of [place]  Does patient want to make changes to medical advance directive? - - - No - Patient declined No - Patient declined  Copy of [place]? - No - copy requested No - copy requested No - copy requested No - copy requested    Tobacco Social History   Tobacco Use  Smoking Status Former Smoker  Smokeless Tobacco Never Used  Tobacco Comment   minimal smoking as a teenager     Counseling given: Not Answered Comment: minimal smoking as a teenager   Clinical Intake:  Pre-visit preparation  completed: Yes  Pain : No/denies pain     Nutritional Status: BMI of 19-24  Normal Nutritional Risks: None Diabetes: No  How often do you need to have someone help you when you read instructions, pamphlets, or other written materials from your doctor or pharmacy?: 1 - Never What is the last grade level you completed in school?: college  Interpreter Needed?: No  Information entered by :: NAllen LPN  Past Medical History:  Diagnosis Date  . Adjustment disorder with depressed mood   . Asthma   . B12 deficiency anemia 2014   mild  . Frequent headaches   . History of chicken pox   . Hyperlipidemia   . Hypertension   . Insomnia   . 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 Seasonal allergic rhinitis    Past Surgical History:  Procedure Laterality Date  . ABDOMINAL HYSTERECTOMY    . BREAST CYST ASPIRATION Left 80s   benign  . BREAST EXCISIONAL BIOPSY Right 1971   neg  . COLONOSCOPY  07/2013   WNL per patient ([place])  . dexa  03/2014   T score spine -3.5, hip -2.0  . NASAL SINUS SURGERY     deviated septum  . TONSILLECTOMY  1970  . TOTAL ABDOMINAL HYSTERECTOMY W/ BILATERAL SALPINGOOPHORECTOMY  1994   uterine cyst with heavy bleeding   Family History  Problem Relation Age of Onset  . Cancer Mother 613      lung met to brain, smoker  .  Stroke Mother   . Alzheimer's disease Mother   . Arthritis Father   . Arthritis Sister   . Cancer Brother 60       pancreatic  . Diabetes Maternal Grandmother   . Breast cancer Neg Hx    Social History   Socioeconomic History  . Marital status: Widowed    Spouse name: Not on file  . Number of children: Not on file  . Years of education: Not on file  . Highest education level: Not on file  Occupational History  . Not on file  Social Needs  . Financial resource strain: Not hard at all  . Food insecurity    Worry: Never true    Inability: Never true  . Transportation  needs    Medical: No    Non-medical: No  Tobacco Use  . Smoking status: Former Research scientist (life sciences)  . Smokeless tobacco: Never Used  . Tobacco comment: minimal smoking as a teenager  Substance and Sexual Activity  . Alcohol use: No  . Drug use: No  . Sexual activity: Not Currently  Lifestyle  . Physical activity    Days per week: 0 days    Minutes per session: 0 min  . Stress: Not at all  Relationships  . Social Herbalist on phone: Not on file    Gets together: Not on file    Attends religious service: Not on file    Active member of club or organization: Not on file    Attends meetings of clubs or organizations: Not on file    Relationship status: Not on file  Other Topics Concern  . Not on file  Social History Narrative   Lives alone - husband passed away 10/03/13. 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 albuterol (VENTOLIN HFA) 108 (90 Base) MCG/ACT inhaler Inhale 2 puffs into the lungs every 6 (six) hours as needed for wheezing or shortness of breath.  Marland Kitchen alendronate (FOSAMAX) 70 MG tablet Take 1 tablet (70 mg total) by mouth every 7 (seven) days. Take with a full glass of water on an empty stomach.  Marland Kitchen atorvastatin (LIPITOR) 10 MG tablet TAKE ONE TABLET BY MOUTH EVERY DAY  . budesonide-formoterol (SYMBICORT) 80-4.5 MCG/ACT inhaler Inhale 1 puff into the lungs 2 (two) times daily.  Marland Kitchen buPROPion (WELLBUTRIN SR) 150 MG 12 hr tablet TAKE ONE TABLET BY MOUTH EVERY DAY  . Calcium Carbonate-Vitamin D (CALCIUM 600/VITAMIN D) 600-400 MG-UNIT chew tablet Chew 1 tablet by mouth daily.  . cetirizine (ZYRTEC) 10 MG tablet Take 1 tablet (10 mg total) by mouth daily.  . Cholecalciferol (VITAMIN D3) 50000 units TABS Take 1 tablet by mouth once a week.  . DULoxetine (CYMBALTA) 60 MG  capsule TAKE 1 CAPSULE BY MOUTH EVERY DAY  . ibuprofen (ADVIL,MOTRIN) 400 MG tablet Take 1.5 tablets (600 mg total) by mouth every 6 (six) hours as needed for moderate pain. (Patient taking differently: Take 200 mg by mouth every 6 (six) hours as needed for moderate pain. )  . lisinopril-hydrochlorothiazide (PRINZIDE,ZESTORETIC) 10-12.5 MG tablet Take 1 tablet by mouth daily.  . Vitamin D, Ergocalciferol, (DRISDOL) 50000 units CAPS capsule Take 1 capsule (50,000 Units total) by mouth every 7 (seven) days.  Marland Kitchen zolpidem (AMBIEN) 10 MG tablet TAKE 1/2 TO  1 TABLET BY MOUTH AT BEDTIMEAS NEEDED FOR SLEEP  . cyanocobalamin (,VITAMIN B-12,) 1000 MCG/ML injection Inject 1,000 mcg into the muscle every 30 (thirty) days.  . Cyanocobalamin (VITAMIN B-12) 1000 MCG SUBL Place 1 tablet (1,000 mcg total) under the tongue daily. (Patient not taking: Reported on 04/05/2019)  . predniSONE (DELTASONE) 20 MG tablet Take 2 tablets (40 mg total) by mouth daily. For 3 days, then 1 tab daily for 3 days (Patient not taking: Reported on 04/05/2019)   No facility-administered encounter medications on file as of 04/05/2019.     Activities of Daily Living In your present state of health, do you have any difficulty performing the following activities: 04/05/2019  Hearing? N  Vision? N  Difficulty concentrating or making decisions? N  Walking or climbing stairs? N  Dressing or bathing? N  Doing errands, shopping? N  Preparing Food and eating ? N  Using the Toilet? N  In the past six months, have you accidently leaked urine? N  Do you have problems with loss of bowel control? N  Managing your Medications? N  Managing your Finances? N  Housekeeping or managing your Housekeeping? N  Some recent data might be hidden    Patient Care Team: Ria Bush, MD as PCP - General (Family Medicine) Lorelee Cover., MD as Consulting Physician (Ophthalmology) Mellissa Kohut, DDS, PA as Consulting Physician (Dentistry)    Assessment:    This is a routine wellness examination for Courtney Thomas.  Exercise Activities and Dietary recommendations Current Exercise Habits: The patient does not participate in regular exercise at present  Goals    . Patient Stated     Starting 03/28/2018, I will continue to take medications as prescribed.     . Weight (lb) < 200 lb (90.7 kg)     04/05/2019, wants to weigh 110 pounds       Fall Risk Fall Risk  04/05/2019 03/28/2018 03/27/2017 03/25/2016 03/17/2015  Falls in the past year? 0 No No No No  Number falls in past yr: - - - - -  Comment - - - - -  Injury with Fall? - - - - -  Comment - - - - -  Risk for fall due to : Medication side effect - - - -  Follow up Falls evaluation completed;Falls prevention discussed - - - -   Is the patient's home free of loose throw rugs in walkways, pet beds, electrical cords, etc?   yes      Grab bars in the bathroom? yes      Handrails on the stairs?   yes      Adequate lighting?   yes  Timed Get Up and Go performed: n/a  Depression Screen PHQ 2/9 Scores 04/05/2019 03/28/2018 03/21/2018 03/27/2017  PHQ - 2 Score 0 0 0 0  PHQ- 9 Score 3 0 4 1     Cognitive Function MMSE - Mini Mental State Exam 04/05/2019 03/28/2018 03/27/2017 03/25/2016  Orientation to time _0 Orientation to Place _1 Registration _2 Attention/ Calculation 5 0 0 0  Recall _3 Recall-comments - unable to recall 1 of 3 words - -  Language- name 2 objects 0 0 0 0  Language- repeat _4 Language- follow 3 step command 0 _5 Language- read & follow direction 0 0 0 0  Write a sentence 0 0 0 0  Copy  design 0 0 0 0  Total score _0 Mini Cog  Mini-Cog screen was completed. Maximum score is 22. A value of 0 denotes this part of the MMSE was not completed or the patient failed this part of the Mini-Cog screening.       Immunization History  Administered Date(s) Administered  . Influenza, Quadrivalent, Recombinant, Inj, Pf 04/01/2016, 05/09/2017  .  Pneumococcal Conjugate-13 08/27/2015  . Pneumococcal Polysaccharide-23 04/05/2017  . Tdap 08/02/2011    Qualifies for Shingles Vaccine? yes  Screening Tests Health Maintenance  Topic Date Due  . INFLUENZA VACCINE  03/02/2019  . DTaP/Tdap/Td (2 - Td) 03/25/2024 (Originally 08/01/2021)  . MAMMOGRAM  03/22/2020  . TETANUS/TDAP  08/01/2021  . COLONOSCOPY  08/02/2023  . DEXA SCAN  Completed  . Hepatitis C Screening  Completed  . PNA vac Low Risk Adult  Completed    Cancer Screenings: Lung: Low Dose CT Chest recommended if Age 75-80 years, 30 pack-year currently smoking OR have quit w/in 15years. Patient does not qualify. Breast:  Up to date on Mammogram? Yes   Up to date of Bone Density/Dexa? Yes Colorectal: up to date  Additional Screenings: : Hepatitis C Screening: 03/2016     Plan:    patient wants to weigh 110 pounds.   I have personally reviewed and noted the following in the patient's chart:   . Medical and social history . Use of alcohol, tobacco or illicit drugs  . Current medications and supplements . Functional ability and status . Nutritional status . Physical activity . Advanced directives . List of other physicians . Hospitalizations, surgeries, and ER visits in previous 12 months . Vitals . Screenings to include cognitive, depression, and falls . Referrals and appointments  In addition, I have reviewed and discussed with patient certain preventive protocols, quality metrics, and best practice recommendations. A written personalized care plan for preventive services as well as general preventive health recommendations were provided to patient.     Kellie Simmering, LPN  02/05/339

## 2019-04-05 NOTE — Patient Instructions (Signed)
Courtney Thomas , Thank you for taking time to come for your Medicare Wellness Visit. I appreciate your ongoing commitment to your health goals. Please review the following plan we discussed and let me know if I can assist you in the future.   Screening recommendations/referrals: Colonoscopy: 08/2013 Mammogram: 03/2018 Bone Density: 05/2016 Recommended yearly ophthalmology/optometry visit for glaucoma screening and checkup Recommended yearly dental visit for hygiene and checkup  Vaccinations: Influenza vaccine: 05/2017 Pneumococcal vaccine: 04/2017 Tdap vaccine: 08/2011 Shingles vaccine: discussed    Advanced directives: Please bring a copy of your POA (Power of [place]) and/or Living Will to your next appointment.    Conditions/risks identified: HTN  Next appointment: 04/16/2019 at 4:00   Preventive Care 71 Years and Older, Female Preventive care refers to lifestyle choices and visits with your health care provider that can promote health and wellness. What does preventive care include?  A yearly physical exam. This is also called an annual well check.  Dental exams once or twice a year.  Routine eye exams. Ask your health care provider how often you should have your eyes checked.  Personal lifestyle choices, including:  Daily care of your teeth and gums.  Regular physical activity.  Eating a healthy diet.  Avoiding tobacco and drug use.  Limiting alcohol use.  Practicing safe sex.  Taking low-dose aspirin every day.  Taking vitamin and mineral supplements as recommended by your health care provider. What happens during an annual well check? The services and screenings done by your health care provider during your annual well check will depend on your age, overall health, lifestyle risk factors, and family history of disease. Counseling  Your health care provider may ask you questions about your:  Alcohol use.  Tobacco use.  Drug use.  Emotional well-being.  Home  and relationship well-being.  Sexual activity.  Eating habits.  History of falls.  Memory and ability to understand (cognition).  Work and work Statistician.  Reproductive health. Screening  You may have the following tests or measurements:  Height, weight, and BMI.  Blood pressure.  Lipid and cholesterol levels. These may be checked every 5 years, or more frequently if you are over 44 years old.  Skin check.  Lung cancer screening. You may have this screening every year starting at age 28 if you have a 30-pack-year history of smoking and currently smoke or have quit within the past 15 years.  Fecal occult blood test (FOBT) of the stool. You may have this test every year starting at age 42.  Flexible sigmoidoscopy or colonoscopy. You may have a sigmoidoscopy every 5 years or a colonoscopy every 10 years starting at age 84.  Hepatitis C blood test.  Hepatitis B blood test.  Sexually transmitted disease (STD) testing.  Diabetes screening. This is done by checking your blood sugar (glucose) after you have not eaten for a while (fasting). You may have this done every 1-3 years.  Bone density scan. This is done to screen for osteoporosis. You may have this done starting at age 64.  Mammogram. This may be done every 1-2 years. Talk to your health care provider about how often you should have regular mammograms. Talk with your health care provider about your test results, treatment options, and if necessary, the need for more tests. Vaccines  Your health care provider may recommend certain vaccines, such as:  Influenza vaccine. This is recommended every year.  Tetanus, diphtheria, and acellular pertussis (Tdap, Td) vaccine. You may need a Td booster  every 10 years.  Zoster vaccine. You may need this after age 37.  Pneumococcal 13-valent conjugate (PCV13) vaccine. One dose is recommended after age 36.  Pneumococcal polysaccharide (PPSV23) vaccine. One dose is recommended  after age 53. Talk to your health care provider about which screenings and vaccines you need and how often you need them. This information is not intended to replace advice given to you by your health care provider. Make sure you discuss any questions you have with your health care provider. Document Released: 08/14/2015 Document Revised: 04/06/2016 Document Reviewed: 05/19/2015 Elsevier Interactive Patient Education  2017 [place] Prevention in the Home Falls can cause injuries. They can happen to people of all ages. There are many things you can do to make your home safe and to help prevent falls. What can I do on the outside of my home?  Regularly fix the edges of walkways and driveways and fix any cracks.  Remove anything that might make you trip as you walk through a door, such as a raised step or threshold.  Trim any bushes or trees on the path to your home.  Use bright outdoor lighting.  Clear any walking paths of anything that might make someone trip, such as rocks or tools.  Regularly check to see if handrails are loose or broken. Make sure that both sides of any steps have handrails.  Any raised decks and porches should have guardrails on the edges.  Have any leaves, snow, or ice cleared regularly.  Use sand or salt on walking paths during winter.  Clean up any spills in your garage right away. This includes oil or grease spills. What can I do in the bathroom?  Use night lights.  Install grab bars by the toilet and in the tub and shower. Do not use towel bars as grab bars.  Use non-skid mats or decals in the tub or shower.  If you need to sit down in the shower, use a plastic, non-slip stool.  Keep the floor dry. Clean up any water that spills on the floor as soon as it happens.  Remove soap buildup in the tub or shower regularly.  Attach bath mats securely with double-sided non-slip rug tape.  Do not have throw rugs and other things on the floor  that can make you trip. What can I do in the bedroom?  Use night lights.  Make sure that you have a light by your bed that is easy to reach.  Do not use any sheets or blankets that are too big for your bed. They should not hang down onto the floor.  Have a firm chair that has side arms. You can use this for support while you get dressed.  Do not have throw rugs and other things on the floor that can make you trip. What can I do in the kitchen?  Clean up any spills right away.  Avoid walking on wet floors.  Keep items that you use a lot in easy-to-reach places.  If you need to reach something above you, use a strong step stool that has a grab bar.  Keep electrical cords out of the way.  Do not use floor polish or wax that makes floors slippery. If you must use wax, use non-skid floor wax.  Do not have throw rugs and other things on the floor that can make you trip. What can I do with my stairs?  Do not leave any items on the stairs.  Make  sure that there are handrails on both sides of the stairs and use them. Fix handrails that are broken or loose. Make sure that handrails are as long as the stairways.  Check any carpeting to make sure that it is firmly attached to the stairs. Fix any carpet that is loose or worn.  Avoid having throw rugs at the top or bottom of the stairs. If you do have throw rugs, attach them to the floor with carpet tape.  Make sure that you have a light switch at the top of the stairs and the bottom of the stairs. If you do not have them, ask someone to add them for you. What else can I do to help prevent falls?  Wear shoes that:  Do not have high heels.  Have rubber bottoms.  Are comfortable and fit you well.  Are closed at the toe. Do not wear sandals.  If you use a stepladder:  Make sure that it is fully opened. Do not climb a closed stepladder.  Make sure that both sides of the stepladder are locked into place.  Ask someone to hold it  for you, if possible.  Clearly mark and make sure that you can see:  Any grab bars or handrails.  First and last steps.  Where the edge of each step is.  Use tools that help you move around (mobility aids) if they are needed. These include:  Canes.  Walkers.  Scooters.  Crutches.  Turn on the lights when you go into a dark area. Replace any light bulbs as soon as they burn out.  Set up your furniture so you have a clear path. Avoid moving your furniture around.  If any of your floors are uneven, fix them.  If there are any pets around you, be aware of where they are.  Review your medicines with your doctor. Some medicines can make you feel dizzy. This can increase your chance of falling. Ask your doctor what other things that you can do to help prevent falls. This information is not intended to replace advice given to you by your health care provider. Make sure you discuss any questions you have with your health care provider. Document Released: 05/14/2009 Document Revised: 12/24/2015 Document Reviewed: 08/22/2014 Elsevier Interactive Patient Education  2017 [employer].

## 2019-04-12 ENCOUNTER — Other Ambulatory Visit: Payer: Self-pay | Admitting: Family Medicine

## 2019-04-16 ENCOUNTER — Ambulatory Visit (INDEPENDENT_AMBULATORY_CARE_PROVIDER_SITE_OTHER): Payer: Medicare HMO | Admitting: Family Medicine

## 2019-04-16 ENCOUNTER — Other Ambulatory Visit: Payer: Self-pay

## 2019-04-16 ENCOUNTER — Encounter: Payer: Self-pay | Admitting: Family Medicine

## 2019-04-16 VITALS — BP 112/64 | HR 77 | Temp 97.9°F | Ht <= 58 in | Wt 122.0 lb

## 2019-04-16 DIAGNOSIS — Z23 Encounter for immunization: Secondary | ICD-10-CM | POA: Diagnosis not present

## 2019-04-16 DIAGNOSIS — F4321 Adjustment disorder with depressed mood: Secondary | ICD-10-CM

## 2019-04-16 DIAGNOSIS — J439 Emphysema, unspecified: Secondary | ICD-10-CM | POA: Diagnosis not present

## 2019-04-16 DIAGNOSIS — E785 Hyperlipidemia, unspecified: Secondary | ICD-10-CM

## 2019-04-16 DIAGNOSIS — I10 Essential (primary) hypertension: Secondary | ICD-10-CM | POA: Diagnosis not present

## 2019-04-16 DIAGNOSIS — Z0000 Encounter for general adult medical examination without abnormal findings: Secondary | ICD-10-CM | POA: Diagnosis not present

## 2019-04-16 DIAGNOSIS — M810 Age-related osteoporosis without current pathological fracture: Secondary | ICD-10-CM

## 2019-04-16 DIAGNOSIS — E559 Vitamin D deficiency, unspecified: Secondary | ICD-10-CM

## 2019-04-16 DIAGNOSIS — Z7189 Other specified counseling: Secondary | ICD-10-CM | POA: Diagnosis not present

## 2019-04-16 DIAGNOSIS — E538 Deficiency of other specified B group vitamins: Secondary | ICD-10-CM

## 2019-04-16 DIAGNOSIS — I700 Atherosclerosis of aorta: Secondary | ICD-10-CM

## 2019-04-16 MED ORDER — CYANOCOBALAMIN 1000 MCG/ML IJ SOLN
1000.0000 ug | Freq: Once | INTRAMUSCULAR | Status: AC
  Administered 2019-04-16: 1000 ug via INTRAMUSCULAR

## 2019-04-16 MED ORDER — VITAMIN D3 1.25 MG (50000 UT) PO TABS: 1.0000 | ORAL_TABLET | ORAL | 3 refills | Status: DC

## 2019-04-16 MED ORDER — BUPROPION HCL ER (SR) 150 MG PO TB12: 150.0000 mg | ORAL_TABLET | Freq: Every day | ORAL | 3 refills | Status: DC

## 2019-04-16 MED ORDER — ATORVASTATIN CALCIUM 10 MG PO TABS: 10.0000 mg | ORAL_TABLET | Freq: Every day | ORAL | 3 refills | Status: DC

## 2019-04-16 MED ORDER — DULOXETINE HCL 60 MG PO CPEP: ORAL_CAPSULE | ORAL | 3 refills | Status: DC

## 2019-04-16 MED ORDER — LISINOPRIL-HYDROCHLOROTHIAZIDE 10-12.5 MG PO TABS: 1.0000 | ORAL_TABLET | Freq: Every day | ORAL | 3 refills | Status: DC

## 2019-04-16 NOTE — Assessment & Plan Note (Signed)
Chronic, stable. Continue atorvastatin. The 10-year ASCVD risk score Mikey Bussing DC Brooke Bonito., et al., 2013) is: 9.7%   Values used to calculate the score:     Age: 73 years     Sex: Female     Is Non-Hispanic African American: No     Diabetic: No     Tobacco smoker: No     Systolic Blood Pressure: [id number] mmHg     Is BP treated: Yes     HDL Cholesterol: 66.9 mg/dL     Total Cholesterol: 142 mg/dL

## 2019-04-16 NOTE — Patient Instructions (Addendum)
Flu shot today.  b12 shot today, then start monthly nurse visit for this.  I have refilled vitamin D weekly to pharmacy - restart this.  We will price out prolia injections in place of fosamax.  If interested, check with pharmacy about new 2 shot shingles series (shingrix).  Bring me copy of your advanced directive  Work on regular walking routine for weight bearing exercise for bone strength.  You are doing well today, return as needed or in 1 year for next wellness visit/physical.  Health Maintenance After Age 26 After age 44, you are at a higher risk for certain long-term diseases and infections as well as injuries from falls. Falls are a major cause of broken bones and head injuries in people who are older than age 22. Getting regular preventive care can help to keep you healthy and well. Preventive care includes getting regular testing and making lifestyle changes as recommended by your health care provider. Talk with your health care provider about:  Which screenings and tests you should have. A screening is a test that checks for a disease when you have no symptoms.  A diet and exercise plan that is right for you. What should I know about screenings and tests to prevent falls? Screening and testing are the best ways to find a health problem early. Early diagnosis and treatment give you the best chance of managing medical conditions that are common after age 46. Certain conditions and lifestyle choices may make you more likely to have a fall. Your health care provider may recommend:  Regular vision checks. Poor vision and conditions such as cataracts can make you more likely to have a fall. If you wear glasses, make sure to get your prescription updated if your vision changes.  Medicine review. Work with your health care provider to regularly review all of the medicines you are taking, including over-the-counter medicines. Ask your health care provider about any side effects that may make  you more likely to have a fall. Tell your health care provider if any medicines that you take make you feel dizzy or sleepy.  Osteoporosis screening. Osteoporosis is a condition that causes the bones to get weaker. This can make the bones weak and cause them to break more easily.  Blood pressure screening. Blood pressure changes and medicines to control blood pressure can make you feel dizzy.  Strength and balance checks. Your health care provider may recommend certain tests to check your strength and balance while standing, walking, or changing positions.  Foot health exam. Foot pain and numbness, as well as not wearing proper footwear, can make you more likely to have a fall.  Depression screening. You may be more likely to have a fall if you have a fear of falling, feel emotionally low, or feel unable to do activities that you used to do.  Alcohol use screening. Using too much alcohol can affect your balance and may make you more likely to have a fall. What actions can I take to lower my risk of falls? General instructions  Talk with your health care provider about your risks for falling. Tell your health care provider if: ? You fall. Be sure to tell your health care provider about all falls, even ones that seem minor. ? You feel dizzy, sleepy, or off-balance.  Take over-the-counter and prescription medicines only as told by your health care provider. These include any supplements.  Eat a healthy diet and maintain a healthy weight. A healthy diet includes low-fat  dairy products, low-fat (lean) meats, and fiber from whole grains, beans, and lots of fruits and vegetables. Home safety  Remove any tripping hazards, such as rugs, cords, and clutter.  Install safety equipment such as grab bars in bathrooms and safety rails on stairs.  Keep rooms and walkways well-lit. Activity   Follow a regular exercise program to stay fit. This will help you maintain your balance. Ask your health care  provider what types of exercise are appropriate for you.  If you need a cane or walker, use it as recommended by your health care provider.  Wear supportive shoes that have nonskid soles. Lifestyle  Do not drink alcohol if your health care provider tells you not to drink.  If you drink alcohol, limit how much you have: ? 0-1 drink a day for women. ? 0-2 drinks a day for men.  Be aware of how much alcohol is in your drink. In the [place] one drink equals one typical bottle of beer (12 oz), one-half glass of wine (5 oz), or one shot of hard liquor (1 oz).  Do not use any products that contain nicotine or tobacco, such as cigarettes and e-cigarettes. If you need help quitting, ask your health care provider. Summary  Having a healthy lifestyle and getting preventive care can help to protect your health and wellness after age 6.  Screening and testing are the best way to find a health problem early and help you avoid having a fall. Early diagnosis and treatment give you the best chance for managing medical conditions that are more common for people who are older than age 77.  Falls are a major cause of broken bones and head injuries in people who are older than age 36. Take precautions to prevent a fall at home.  Work with your health care provider to learn what changes you can make to improve your health and wellness and to prevent falls. This information is not intended to replace advice given to you by your health care provider. Make sure you discuss any questions you have with your health care provider. Document Released: 05/31/2017 Document Revised: 11/08/2018 Document Reviewed: 05/31/2017 Elsevier Patient Education  2020 [employer].

## 2019-04-16 NOTE — Assessment & Plan Note (Signed)
Marked deficiency. Restart weekly replacement for the next year.

## 2019-04-16 NOTE — Assessment & Plan Note (Addendum)
Faint L mid chest wheezing without other signs of COPD or asthma exacerbation. Discussed PRN albuterol use.

## 2019-04-16 NOTE — Assessment & Plan Note (Signed)
Continue statin. 

## 2019-04-16 NOTE — Assessment & Plan Note (Signed)
Chronic, stable. Continue current regimen. 

## 2019-04-16 NOTE — Progress Notes (Signed)
This visit was conducted in person.  BP 112/64 (BP Location: Left Arm, Patient Position: Sitting, Cuff Size: Normal)   Pulse 77   Temp 97.9 F (36.6 C) (Temporal)   Ht 4\' 10"  (1.473 m)   Wt 122 lb (55.3 kg)   SpO2 97%   BMI 25.50 kg/m    CC: CPE Subjective:    Patient ID: Courtney Thomas, female    DOB: [birth year], 73 y.o.   MRN: [id number]  HPI: Courtney Thomas is a 73 y.o. female presenting on 04/16/2019 for Annual Exam (Prt 2. )   Saw health advisor last week for medicare wellness visit, note reviewed.   Preventative: Colonoscopy - 07/2013 WNL 10 yr f/u ([place]) Well woman - with GYN, states pap smear Nov 11, 2016 WNL ([facility] in [place] - to retire). S/p hysterectomy and BSO. Mammogram at [facility]8/2019WNL.rpt pending next month. She does breast exams at home. DEXA10/2017T -3.5--> 3.3spine, -2.0 --> -1.7hip, on fosamax since prior to 03/2014. No jaw or hip pain. Discussed options - interested in prolia. Will update DEXA as well.  Egg free flu shotyearly.  Prevnar 11/12/15.Pneumovax 11-11-2016. Tdap 2011-11-12 with bad reaction Shingrix - discussed  Advanced directives:has at home.HCPOA is daughter [name]. To bring me copy of living will. Seat belt use discussed Sunscreen use discussed. No changing moles on skin. Non smoker  Alcohol - none  Dentist Q6 mo Eye exam yearly Bowel - no constipation Bladder - no incontinence  Lives alone - husband passed away 2013/11/11. LBD + parkinson disease Started seeing new partner 11-Nov-2016. 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 Interim medical history since our last visit reviewed. Allergies and medications reviewed and updated. Outpatient Medications Prior to Visit  Medication Sig Dispense Refill  . albuterol (VENTOLIN HFA) 108 (90 Base)  MCG/ACT inhaler Inhale 2 puffs into the lungs every 6 (six) hours as needed for wheezing or shortness of breath.    Marland Kitchen alendronate (FOSAMAX) 70 MG tablet Take 1 tablet (70 mg total) by mouth every 7 (seven) days. Take with a full glass of water on an empty stomach. 12 tablet 3  . cetirizine (ZYRTEC) 10 MG tablet Take 1 tablet (10 mg total) by mouth daily. 90 tablet 3  . zolpidem (AMBIEN) 10 MG tablet TAKE 1/2 TO 1 TABLET BY MOUTH AT BEDTIMEAS NEEDED FOR SLEEP 30 tablet 0  . atorvastatin (LIPITOR) 10 MG tablet TAKE ONE TABLET BY MOUTH EVERY DAY 90 tablet 0  . buPROPion (WELLBUTRIN SR) 150 MG 12 hr tablet TAKE ONE TABLET BY MOUTH EVERY DAY 30 tablet 5  . Cholecalciferol (VITAMIN D3 PO) Take 500 Units by mouth daily.    . DULoxetine (CYMBALTA) 60 MG capsule TAKE 1 CAPSULE BY MOUTH EVERY DAY 90 capsule 1  . lisinopril-hydrochlorothiazide (ZESTORETIC) 10-12.5 MG tablet TAKE ONE TABLET BY MOUTH EVERY DAY 90 tablet 0  . cyanocobalamin (,VITAMIN B-12,) 1000 MCG/ML injection Inject 1 mL (1,000 mcg total) into the muscle every 30 (thirty) days. 1 mL 0  . budesonide-formoterol (SYMBICORT) 80-4.5 MCG/ACT inhaler Inhale 1 puff into the lungs 2 (two) times daily. 3 Inhaler 3  . Calcium Carbonate-Vitamin D (CALCIUM 600/VITAMIN D) 600-400 MG-UNIT chew tablet Chew 1 tablet by mouth daily.    . Cholecalciferol (VITAMIN D3) 50000 units TABS Take 1 tablet by mouth once  a week. 12 tablet 1  . cyanocobalamin (,VITAMIN B-12,) 1000 MCG/ML injection Inject 1,000 mcg into the muscle every 30 (thirty) days.    . Cyanocobalamin (VITAMIN B-12) 1000 MCG SUBL Place 1 tablet (1,000 mcg total) under the tongue daily. (Patient not taking: Reported on 04/05/2019)  0  . ibuprofen (ADVIL,MOTRIN) 400 MG tablet Take 1.5 tablets (600 mg total) by mouth every 6 (six) hours as needed for moderate pain. (Patient taking differently: Take 200 mg by mouth every 6 (six) hours as needed for moderate pain. ) 20 tablet 0  . predniSONE (DELTASONE) 20 MG  tablet Take 2 tablets (40 mg total) by mouth daily. For 3 days, then 1 tab daily for 3 days (Patient not taking: Reported on 04/05/2019) 9 tablet 0  . Vitamin D, Ergocalciferol, (DRISDOL) 50000 units CAPS capsule Take 1 capsule (50,000 Units total) by mouth every 7 (seven) days. 12 capsule 3   No facility-administered medications prior to visit.      Per HPI unless specifically indicated in ROS section below Review of Systems  Constitutional: Negative for activity change, appetite change, chills, fatigue, fever and unexpected weight change.  HENT: Negative for hearing loss.   Eyes: Negative for visual disturbance.  Respiratory: Negative for cough, chest tightness, shortness of breath and wheezing.   Cardiovascular: Negative for chest pain, palpitations and leg swelling.  Gastrointestinal: Negative for abdominal distention, abdominal pain, blood in stool, constipation, diarrhea, nausea and vomiting.  Genitourinary: Negative for difficulty urinating and hematuria.  Musculoskeletal: Negative for arthralgias, myalgias and neck pain.  Skin: Negative for rash.  Neurological: Negative for dizziness, seizures, syncope and headaches.  Hematological: Negative for adenopathy. Bruises/bleeds easily.  Psychiatric/Behavioral: Negative for dysphoric mood. The patient is not nervous/anxious.    Objective:    BP 112/64 (BP Location: Left Arm, Patient Position: Sitting, Cuff Size: Normal)   Pulse 77   Temp 97.9 F (36.6 C) (Temporal)   Ht 4\' 10"  (1.473 m)   Wt 122 lb (55.3 kg)   SpO2 97%   BMI 25.50 kg/m   Wt Readings from Last 3 Encounters:  04/16/19 122 lb (55.3 kg)  04/05/19 122 lb (55.3 kg)  09/14/18 126 lb (57.2 kg)    Physical Exam Vitals signs and nursing note reviewed.  Constitutional:      General: She is not in acute distress.    Appearance: Normal appearance. She is well-developed. She is not ill-appearing.  HENT:     Head: Normocephalic and atraumatic.     Right Ear: Hearing,  tympanic membrane, ear canal and external ear normal.     Left Ear: Hearing, tympanic membrane, ear canal and external ear normal.     Nose: Nose normal.     Mouth/Throat:     Mouth: Mucous membranes are moist.     Pharynx: Uvula midline. No oropharyngeal exudate or posterior oropharyngeal erythema.  Eyes:     General: No scleral icterus.    Extraocular Movements: Extraocular movements intact.     Conjunctiva/sclera: Conjunctivae normal.     Pupils: Pupils are equal, round, and reactive to light.  Neck:     Musculoskeletal: Normal range of motion and neck supple.     Vascular: No carotid bruit.  Cardiovascular:     Rate and Rhythm: Normal rate and regular rhythm.     Pulses: Normal pulses.          Radial pulses are 2+ on the right side and 2+ on the left side.  Heart sounds: Normal heart sounds. No murmur.  Pulmonary:     Effort: Pulmonary effort is normal. No respiratory distress.     Breath sounds: Normal breath sounds. No wheezing, rhonchi or rales.  Abdominal:     General: Abdomen is flat. Bowel sounds are normal. There is no distension.     Palpations: Abdomen is soft. There is no mass.     Tenderness: There is no abdominal tenderness. There is no guarding or rebound.     Hernia: No hernia is present.  Musculoskeletal: Normal range of motion.  Lymphadenopathy:     Cervical: No cervical adenopathy.  Skin:    General: Skin is warm and dry.     Findings: No rash.  Neurological:     General: No focal deficit present.     Mental Status: She is alert and oriented to person, place, and time.     Comments: CN grossly intact, station and gait intact  Psychiatric:        Mood and Affect: Mood normal.        Behavior: Behavior normal.        Thought Content: Thought content normal.        Judgment: Judgment normal.       Results for orders placed or performed in visit on 04/05/19  CBC with Differential/Platelet  Result Value Ref Range   WBC 6.5 4.0 - 10.5 K/uL   RBC  4.15 3.87 - 5.11 Mil/uL   Hemoglobin 12.0 12.0 - 15.0 g/dL   HCT 36.9 36.0 - 46.0 %   MCV 88.9 78.0 - 100.0 fl   MCHC 32.6 30.0 - 36.0 g/dL   RDW 13.9 11.5 - 15.5 %   Platelets 246.0 150.0 - 400.0 K/uL   Neutrophils Relative % 58.5 43.0 - 77.0 %   Lymphocytes Relative 26.4 12.0 - 46.0 %   Monocytes Relative 8.2 3.0 - 12.0 %   Eosinophils Relative 6.2 (H) 0.0 - 5.0 %   Basophils Relative 0.7 0.0 - 3.0 %   Neutro Abs 3.8 1.4 - 7.7 K/uL   Lymphs Abs 1.7 0.7 - 4.0 K/uL   Monocytes Absolute 0.5 0.1 - 1.0 K/uL   Eosinophils Absolute 0.4 0.0 - 0.7 K/uL   Basophils Absolute 0.0 0.0 - 0.1 K/uL  Vitamin B12  Result Value Ref Range   Vitamin B-12 178 (L) 211 - 911 pg/mL  VITAMIN D 25 Hydroxy (Vit-D Deficiency, Fractures)  Result Value Ref Range   VITD 11.52 (L) 30.00 - 100.00 ng/mL  Comprehensive metabolic panel  Result Value Ref Range   Sodium 140 135 - 145 mEq/L   Potassium 3.7 3.5 - 5.1 mEq/L   Chloride 103 96 - 112 mEq/L   CO2 30 19 - 32 mEq/L   Glucose, Bld 87 70 - 99 mg/dL   BUN 28 (H) 6 - 23 mg/dL   Creatinine, Ser 0.91 0.40 - 1.20 mg/dL   Total Bilirubin 0.4 0.2 - 1.2 mg/dL   Alkaline Phosphatase 82 39 - 117 U/L   AST 9 0 - 37 U/L   ALT 12 0 - 35 U/L   Total Protein 6.5 6.0 - 8.3 g/dL   Albumin 3.9 3.5 - 5.2 g/dL   Calcium 9.1 8.4 - 10.5 mg/dL   GFR 60.83 >60.00 mL/min  Lipid panel  Result Value Ref Range   Cholesterol 142 0 - 200 mg/dL   Triglycerides 50.0 0.0 - 149.0 mg/dL   HDL 66.90 >39.00 mg/dL   VLDL 10.0 0.0 -  40.0 mg/dL   LDL Cholesterol 65 0 - 99 mg/dL   Total CHOL/HDL Ratio 2    NonHDL 75.25    Assessment & Plan:   Problem List Items Addressed This Visit    Vitamin D deficiency    Marked deficiency. Restart weekly replacement for the next year.       Vitamin B12 deficiency    Marked deficiency noted today - pt endorses ongoing fatigue. Will restart b12 shots and recommend monthly shots. Consider IF Ab next labwork.       Thoracic aorta  atherosclerosis (HCC)    Continue statin.       Relevant Medications   atorvastatin (LIPITOR) 10 MG tablet   lisinopril-hydrochlorothiazide (ZESTORETIC) 10-12.5 MG tablet   Osteoporosis    Has completed 5 yrs bisphosphonate. Discussed options. She is interested in prolia - update DEXA. Will price out prolia for her.       Relevant Medications   Cholecalciferol (VITAMIN D3) 1.25 MG (50000 UT) TABS   Other Relevant Orders   DG Bone Density   Hypertension    Chronic, stable. Continue current regimen.       Relevant Medications   atorvastatin (LIPITOR) 10 MG tablet   lisinopril-hydrochlorothiazide (ZESTORETIC) 10-12.5 MG tablet   Hyperlipidemia    Chronic, stable. Continue atorvastatin. The 10-year ASCVD risk score Mikey Bussing DC Brooke Bonito., et al., 2013) is: 9.7%   Values used to calculate the score:     Age: 41 years     Sex: Female     Is Non-Hispanic African American: No     Diabetic: No     Tobacco smoker: No     Systolic Blood Pressure: [id number] mmHg     Is BP treated: Yes     HDL Cholesterol: 66.9 mg/dL     Total Cholesterol: 142 mg/dL       Relevant Medications   atorvastatin (LIPITOR) 10 MG tablet   lisinopril-hydrochlorothiazide (ZESTORETIC) 10-12.5 MG tablet   Health care maintenance - Primary    Preventative protocols reviewed and updated unless pt declined. Discussed healthy diet and lifestyle.       Emphysema of lung (HCC)    Faint L mid chest wheezing without other signs of COPD or asthma exacerbation. Discussed PRN albuterol use.       Advanced care planning/counseling discussion    Advanced directives:has at home.HCPOA is daughter [name]. To bring me copy of living will.      Adjustment disorder with depressed mood    Stable period on cymbalta + wellbutrin. Desires to continue this.        Other Visit Diagnoses    Need for influenza vaccination       Relevant Orders   Flu Vaccine MDCK QUAD PF (Completed)       Meds ordered this encounter  Medications   . atorvastatin (LIPITOR) 10 MG tablet    Sig: Take 1 tablet (10 mg total) by mouth daily.    Dispense:  90 tablet    Refill:  3  . buPROPion (WELLBUTRIN SR) 150 MG 12 hr tablet    Sig: Take 1 tablet (150 mg total) by mouth daily.    Dispense:  90 tablet    Refill:  3  . DULoxetine (CYMBALTA) 60 MG capsule    Sig: TAKE 1 CAPSULE BY MOUTH EVERY DAY    Dispense:  90 capsule    Refill:  3  . lisinopril-hydrochlorothiazide (ZESTORETIC) 10-12.5 MG tablet    Sig: Take 1 tablet by mouth  daily.    Dispense:  90 tablet    Refill:  3  . Cholecalciferol (VITAMIN D3) 1.25 MG (50000 UT) TABS    Sig: Take 1 tablet by mouth once a week.    Dispense:  12 tablet    Refill:  3  . cyanocobalamin ((VITAMIN B-12)) injection 1,000 mcg   Orders Placed This Encounter  Procedures  . DG Bone Density    Standing Status:   Future    Standing Expiration Date:   06/15/2020    Order Specific Question:   Reason for Exam (SYMPTOM  OR DIAGNOSIS REQUIRED)    Answer:   osteoporosis f/u    Order Specific Question:   Preferred imaging location?    Answer:   [facility]    Follow up plan: Return in about 1 year (around 04/15/2020) for annual exam, prior fasting for blood work, medicare wellness visit.  Ria Bush, MD

## 2019-04-16 NOTE — Assessment & Plan Note (Addendum)
Stable period on cymbalta + wellbutrin. Desires to continue this.

## 2019-04-16 NOTE — Assessment & Plan Note (Signed)
Marked deficiency noted today - pt endorses ongoing fatigue. Will restart b12 shots and recommend monthly shots. Consider IF Ab next labwork.

## 2019-04-16 NOTE — Assessment & Plan Note (Signed)
Advanced directives: has at home. HCPOA is daughter Samantha. To bring me copy of living will.  ?

## 2019-04-16 NOTE — Assessment & Plan Note (Signed)
Has completed 5 yrs bisphosphonate. Discussed options. She is interested in prolia - update DEXA. Will price out prolia for her.

## 2019-04-16 NOTE — Assessment & Plan Note (Signed)
Preventative protocols reviewed and updated unless pt declined. Discussed healthy diet and lifestyle.  

## 2019-05-01 ENCOUNTER — Other Ambulatory Visit: Payer: Self-pay | Admitting: Family Medicine

## 2019-05-01 NOTE — Telephone Encounter (Signed)
Name of Medication: Ambien Name of Pharmacy: [facility] or Written Date and Quantity: 04/02/19, #30  Last Office Visit and Type: 04/16/19, CPE prt 2 Next Office Visit and Type: none Last Controlled Substance Agreement Date: none Last UDS: none

## 2019-05-03 NOTE — Telephone Encounter (Signed)
ERx 

## 2019-05-16 ENCOUNTER — Ambulatory Visit (INDEPENDENT_AMBULATORY_CARE_PROVIDER_SITE_OTHER): Payer: Medicare HMO

## 2019-05-16 DIAGNOSIS — E538 Deficiency of other specified B group vitamins: Secondary | ICD-10-CM

## 2019-05-16 MED ORDER — CYANOCOBALAMIN 1000 MCG/ML IJ SOLN
1000.0000 ug | Freq: Once | INTRAMUSCULAR | Status: AC
  Administered 2019-05-16: 1000 ug via INTRAMUSCULAR

## 2019-05-16 NOTE — Progress Notes (Signed)
Per orders of Dr. Gutierrez, injection of vit B12 given by Jarika Robben. Patient tolerated injection well.  

## 2019-05-20 ENCOUNTER — Other Ambulatory Visit: Payer: Self-pay | Admitting: Family Medicine

## 2019-05-31 ENCOUNTER — Other Ambulatory Visit: Payer: Self-pay | Admitting: Family Medicine

## 2019-05-31 NOTE — Telephone Encounter (Signed)
Will you address in Dr. Synthia Innocent absence?   Name of Medication: Ambien Name of Pharmacy: [facility] or Written Date and Quantity: 05/03/19, #30 Last Office Visit and Type: 04/16/19, CPE prt 2 Next Office Visit and Type: none Last Controlled Substance Agreement Date: none Last UDS: none

## 2019-06-02 NOTE — Telephone Encounter (Signed)
Sent. Thanks.   

## 2019-06-12 ENCOUNTER — Ambulatory Visit
Admission: RE | Admit: 2019-06-12 | Discharge: 2019-06-12 | Disposition: A | Discharge status: Home / Self Care | Payer: Medicare HMO | Source: Ambulatory Visit | Attending: Family Medicine | Admitting: Family Medicine

## 2019-06-12 DIAGNOSIS — Z1231 Encounter for screening mammogram for malignant neoplasm of breast: Secondary | ICD-10-CM

## 2019-06-12 DIAGNOSIS — M810 Age-related osteoporosis without current pathological fracture: Secondary | ICD-10-CM | POA: Diagnosis not present

## 2019-06-13 LAB — HM MAMMOGRAPHY

## 2019-06-17 ENCOUNTER — Encounter: Payer: Self-pay | Admitting: Family Medicine

## 2019-06-18 ENCOUNTER — Ambulatory Visit: Payer: Medicare HMO

## 2019-06-24 ENCOUNTER — Encounter: Payer: Self-pay | Admitting: Internal Medicine

## 2019-06-24 ENCOUNTER — Ambulatory Visit (INDEPENDENT_AMBULATORY_CARE_PROVIDER_SITE_OTHER): Payer: Medicare HMO | Admitting: Internal Medicine

## 2019-06-24 VITALS — BP 99/61 | HR 78 | Temp 97.7°F

## 2019-06-24 DIAGNOSIS — J439 Emphysema, unspecified: Secondary | ICD-10-CM

## 2019-06-24 DIAGNOSIS — R197 Diarrhea, unspecified: Secondary | ICD-10-CM

## 2019-06-24 DIAGNOSIS — R52 Pain, unspecified: Secondary | ICD-10-CM | POA: Diagnosis not present

## 2019-06-24 DIAGNOSIS — R0981 Nasal congestion: Secondary | ICD-10-CM

## 2019-06-24 DIAGNOSIS — R519 Headache, unspecified: Secondary | ICD-10-CM | POA: Diagnosis not present

## 2019-06-24 DIAGNOSIS — R448 Other symptoms and signs involving general sensations and perceptions: Secondary | ICD-10-CM

## 2019-06-24 DIAGNOSIS — R112 Nausea with vomiting, unspecified: Secondary | ICD-10-CM | POA: Diagnosis not present

## 2019-06-24 DIAGNOSIS — K068 Other specified disorders of gingiva and edentulous alveolar ridge: Secondary | ICD-10-CM | POA: Diagnosis not present

## 2019-06-24 MED ORDER — PREDNISONE 10 MG PO TABS: ORAL_TABLET | ORAL | 0 refills | Status: DC

## 2019-06-24 NOTE — Patient Instructions (Signed)
COVID-19 Frequently Asked Questions °COVID-19 (coronavirus disease) is an infection that is caused by a large family of viruses. Some viruses cause illness in people and others cause illness in animals like camels, cats, and bats. In some cases, the viruses that cause illness in animals can spread to humans. °Where did the coronavirus come from? °In December 2019, [place] told the World Health Organization (WHO) of several cases of lung disease (human respiratory illness). These cases were linked to an open seafood and livestock market in the city of [place]. The link to the seafood and livestock market suggests that the virus may have spread from animals to humans. However, since that first outbreak in December, the virus has also been shown to spread from person to person. °What is the name of the disease and the virus? °Disease name °Early on, this disease was called novel coronavirus. This is because scientists determined that the disease was caused by a new (novel) respiratory virus. The World Health Organization (WHO) has now named the disease COVID-19, or coronavirus disease. °Virus name °The virus that causes the disease is called severe acute respiratory syndrome coronavirus 2 (SARS-CoV-2). °More information on disease and virus naming °World Health Organization (WHO): www.who.int/emergencies/diseases/novel-coronavirus-2019/technical-guidance/naming-the-coronavirus-disease-(covid-2019)-and-the-virus-that-causes-it °Who is at risk for complications from coronavirus disease? °Some people may be at higher risk for complications from coronavirus disease. This includes older adults and people who have chronic diseases, such as heart disease, diabetes, and lung disease. °If you are at higher risk for complications, take these extra precautions: °· Avoid close contact with people who are sick or have a fever or cough. Stay at least 3-6 ft (1-2 m) away from them, if possible. °· Wash your hands often with soap and  water for at least 20 seconds. °· Avoid touching your face, mouth, nose, or eyes. °· Keep supplies on hand at home, such as food, medicine, and cleaning supplies. °· Stay home as much as possible. °· Avoid social gatherings and travel. °How does coronavirus disease spread? °The virus that causes coronavirus disease spreads easily from person to person (is contagious). There are also cases of community-spread disease. This means the disease has spread to: °· People who have no known contact with other infected people. °· People who have not traveled to areas where there are known cases. °It appears to spread from one person to another through droplets from coughing or sneezing. °Can I get the virus from touching surfaces or objects? °There is still a lot that we do not know about the virus that causes coronavirus disease. Scientists are basing a lot of information on what they know about similar viruses, such as: °· Viruses cannot generally survive on surfaces for long. They need a human body (host) to survive. °· It is more likely that the virus is spread by close contact with people who are sick (direct contact), such as through: °? Shaking hands or hugging. °? Breathing in respiratory droplets that travel through the air. This can happen when an infected person coughs or sneezes on or near other people. °· It is less likely that the virus is spread when a person touches a surface or object that has the virus on it (indirect contact). The virus may be able to enter the body if the person touches a surface or object and then touches his or her face, eyes, nose, or mouth. °Can a person spread the virus without having symptoms of the disease? °It may be possible for the virus to spread before a person   has symptoms of the disease, but this is most likely not the main way the virus is spreading. It is more likely for the virus to spread by being in close contact with people who are sick and breathing in the respiratory  droplets of a sick person's cough or sneeze. °What are the symptoms of coronavirus disease? °Symptoms vary from person to person and can range from mild to severe. Symptoms may include: °· Fever. °· Cough. °· Tiredness, weakness, or fatigue. °· Fast breathing or feeling short of breath. °These symptoms can appear anywhere from 2 to 14 days after you have been exposed to the virus. If you develop symptoms, call your health care provider. People with severe symptoms may need hospital care. °If I am exposed to the virus, how long does it take before symptoms start? °Symptoms of coronavirus disease may appear anywhere from 2 to 14 days after a person has been exposed to the virus. If you develop symptoms, call your health care provider. °Should I be tested for this virus? °Your health care provider will decide whether to test you based on your symptoms, history of exposure, and your risk factors. °How does a health care provider test for this virus? °Health care providers will collect samples to send for testing. Samples may include: °· Taking a swab of fluid from the nose. °· Taking fluid from the lungs by having you cough up mucus (sputum) into a sterile cup. °· Taking a blood sample. °· Taking a stool or urine sample. °Is there a treatment or vaccine for this virus? °Currently, there is no vaccine to prevent coronavirus disease. Also, there are no medicines like antibiotics or antivirals to treat the virus. A person who becomes sick is given supportive care, which means rest and fluids. A person may also relieve his or her symptoms by using over-the-counter medicines that treat sneezing, coughing, and runny nose. These are the same medicines that a person takes for the common cold. °If you develop symptoms, call your health care provider. People with severe symptoms may need hospital care. °What can I do to protect myself and my family from this virus? ° °  ° °You can protect yourself and your family by taking the  same actions that you would take to prevent the spread of other viruses. Take the following actions: °· Wash your hands often with soap and water for at least 20 seconds. If soap and water are not available, use alcohol-based hand sanitizer. °· Avoid touching your face, mouth, nose, or eyes. °· Cough or sneeze into a tissue, sleeve, or elbow. Do not cough or sneeze into your hand or the air. °? If you cough or sneeze into a tissue, throw it away immediately and wash your hands. °· Disinfect objects and surfaces that you frequently touch every day. °· Avoid close contact with people who are sick or have a fever or cough. Stay at least 3-6 ft (1-2 m) away from them, if possible. °· Stay home if you are sick, except to get medical care. Call your health care provider before you get medical care. °· Make sure your vaccines are up to date. Ask your health care provider what vaccines you need. °What should I do if I need to travel? °Follow travel recommendations from your local health authority, the CDC, and WHO. °Travel information and advice °· Centers for Disease Control and Prevention (CDC): www.cdc.gov/coronavirus/2019-ncov/travelers/index.html °· World Health Organization (WHO): www.who.int/emergencies/diseases/novel-coronavirus-2019/travel-advice °Know the risks and take action to protect your health °·   You are at higher risk of getting coronavirus disease if you are traveling to areas with an outbreak or if you are exposed to travelers from areas with an outbreak. °· Wash your hands often and practice good hygiene to lower the risk of catching or spreading the virus. °What should I do if I am sick? °General instructions to stop the spread of infection °· Wash your hands often with soap and water for at least 20 seconds. If soap and water are not available, use alcohol-based hand sanitizer. °· Cough or sneeze into a tissue, sleeve, or elbow. Do not cough or sneeze into your hand or the air. °· If you cough or  sneeze into a tissue, throw it away immediately and wash your hands. °· Stay home unless you must get medical care. Call your health care provider or local health authority before you get medical care. °· Avoid public areas. Do not take public transportation, if possible. °· If you can, wear a mask if you must go out of the house or if you are in close contact with someone who is not sick. °Keep your home clean °· Disinfect objects and surfaces that are frequently touched every day. This may include: °? Counters and tables. °? Doorknobs and light switches. °? Sinks and faucets. °? Electronics such as phones, remote controls, keyboards, computers, and tablets. °· Wash dishes in hot, soapy water or use a dishwasher. Air-dry your dishes. °· Wash laundry in hot water. °Prevent infecting other household members °· Let healthy household members care for children and pets, if possible. If you have to care for children or pets, wash your hands often and wear a mask. °· Sleep in a different bedroom or bed, if possible. °· Do not share personal items, such as razors, toothbrushes, deodorant, combs, brushes, towels, and washcloths. °Where to find more information °Centers for Disease Control and Prevention (CDC) °· Information and news updates: www.cdc.gov/coronavirus/2019-ncov °World Health Organization (WHO) °· Information and news updates: www.who.int/emergencies/diseases/novel-coronavirus-2019 °· Coronavirus health topic: www.who.int/health-topics/coronavirus °· Questions and answers on COVID-19: www.who.int/news-room/q-a-detail/q-a-coronaviruses °· Global tracker: who.sprinklr.com °American Academy of Pediatrics (AAP) °· Information for families: www.healthychildren.org/English/health-issues/conditions/chest-lungs/Pages/2019-Novel-Coronavirus.aspx °The coronavirus situation is changing rapidly. Check your local health authority website or the CDC and WHO websites for updates and news. °When should I contact a health care  provider? °· Contact your health care provider if you have symptoms of an infection, such as fever or cough, and you: °? Have been near anyone who is known to have coronavirus disease. °? Have come into contact with a person who is suspected to have coronavirus disease. °? Have traveled outside of the country. °When should I get emergency medical care? °· Get help right away by calling your local emergency services (911 in the [place] if you have: °? Trouble breathing. °? Pain or pressure in your chest. °? Confusion. °? Blue-tinged lips and fingernails. °? Difficulty waking from sleep. °? Symptoms that get worse. °Let the emergency medical personnel know if you think you have coronavirus disease. °Summary °· A new respiratory virus is spreading from person to person and causing COVID-19 (coronavirus disease). °· The virus that causes COVID-19 appears to spread easily. It spreads from one person to another through droplets from coughing or sneezing. °· Older adults and those with chronic diseases are at higher risk of disease. If you are at higher risk for complications, take extra precautions. °· There is currently no vaccine to prevent coronavirus disease. There are no medicines, such as antibiotics or   antivirals, to treat the virus. °· You can protect yourself and your family by washing your hands often, avoiding touching your face, and covering your coughs and sneezes. °This information is not intended to replace advice given to you by your health care provider. Make sure you discuss any questions you have with your health care provider. °Document Released: 11/13/2018 Document Revised: 11/13/2018 Document Reviewed: 11/13/2018 °Elsevier Patient Education © 2020 [employer] Inc. ° °

## 2019-06-24 NOTE — Progress Notes (Signed)
Virtual Visit via Telephone Note  I connected with Courtney Thomas on 06/24/19 at 11:15 AM EST by telephone and verified that I am speaking with the correct person using two identifiers.  Location: Patient: Home Provider: Office   I discussed the limitations, risks, security and privacy concerns of performing an evaluation and management service by telephone and the availability of in person appointments. I also discussed with the patient that there may be a patient responsible charge related to this service. The patient expressed understanding and agreed to proceed.   History of Present Illness:  Pt reports headache, gum pain, facial pressure, cough and body aches. She describes the headache in the forehead and in the back of head. She describes the pain as achy and pressure. She denies sensitivity to light, sound, dizziness or visual changes.  She can not see any sores or ulcers on her gums. The cough is dry and non productive. She denies runny nose, ear pain, sore throat, shortness of breath or loss of taste or smell. She denies fever, chills. She has had 1 episode of nausea, vomiting and loose stools. She has not taken any medications OTC for this. She reports a history of allergies and reports this feels the same. 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 She does have emphysema, taking Albuterol prn.   Past Medical History:  Diagnosis Date  . Adjustment disorder with depressed mood   . Asthma   . B12 deficiency anemia 2014   mild  . Frequent headaches   . History of chicken pox   . Hyperlipidemia   . Hypertension   . Insomnia   . 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 Seasonal allergic rhinitis     Current Outpatient Medications  Medication Sig Dispense Refill  . albuterol (VENTOLIN HFA) 108 (90 Base) MCG/ACT inhaler Inhale 2 puffs into the lungs every 6 (six) hours as  needed for wheezing or shortness of breath.    Marland Kitchen alendronate (FOSAMAX) 70 MG tablet Take 1 tablet (70 mg total) by mouth every 7 (seven) days. Take with a full glass of water on an empty stomach. 12 tablet 3  . atorvastatin (LIPITOR) 10 MG tablet Take 1 tablet (10 mg total) by mouth daily. 90 tablet 3  . buPROPion (WELLBUTRIN SR) 150 MG 12 hr tablet Take 1 tablet (150 mg total) by mouth daily. 90 tablet 3  . cetirizine (ZYRTEC) 10 MG tablet TAKE ONE TABLET BY MOUTH EVERY DAY 90 tablet 3  . Cholecalciferol (VITAMIN D3) 1.25 MG (50000 UT) TABS Take 1 tablet by mouth once a week. 12 tablet 3  . cyanocobalamin (,VITAMIN B-12,) 1000 MCG/ML injection Inject 1 mL (1,000 mcg total) into the muscle every 30 (thirty) days. 1 mL 0  . DULoxetine (CYMBALTA) 60 MG capsule TAKE 1 CAPSULE BY MOUTH EVERY DAY 90 capsule 3  . lisinopril-hydrochlorothiazide (ZESTORETIC) 10-12.5 MG tablet Take 1 tablet by mouth daily. 90 tablet 3  . zolpidem (AMBIEN) 10 MG tablet TAKE ONE-HALF TO ONE TABLET AT BEDTIME AS NEEDED FOR SLEEP 30 tablet 0  . predniSONE (DELTASONE) 10 MG tablet Take 3 tabs on days 1-2, take 2 tabs on days 3-4, take 1 tab on days 5-6 12 tablet 0   No current facility-administered medications for this visit.     Allergies  Allergen Reactions  . Eggs Or Egg-Derived Products Anaphylaxis  and Swelling  . Codeine Nausea Only    Family History  Problem Relation Age of Onset  . Cancer Mother 26       lung met to brain, smoker  . Stroke Mother   . Alzheimer's disease Mother   . Arthritis Father   . Arthritis Sister   . Cancer Brother 36       pancreatic  . Diabetes Maternal Grandmother   . Breast cancer Neg Hx     Social History   Socioeconomic History  . Marital status: Widowed    Spouse name: Not on file  . Number of children: Not on file  . Years of education: Not on file  . Highest education level: Not on file  Occupational History  . Not on file  Social Needs  . Financial resource  strain: Not hard at all  . Food insecurity    Worry: Never true    Inability: Never true  . Transportation needs    Medical: No    Non-medical: No  Tobacco Use  . Smoking status: Former Research scientist (life sciences)  . Smokeless tobacco: Never Used  . Tobacco comment: minimal smoking as a teenager  Substance and Sexual Activity  . Alcohol use: No  . Drug use: No  . Sexual activity: Not Currently  Lifestyle  . Physical activity    Days per week: 0 days    Minutes per session: 0 min  . Stress: Not at all  Relationships  . Social Herbalist on phone: Not on file    Gets together: Not on file    Attends religious service: Not on file    Active member of club or organization: Not on file    Attends meetings of clubs or organizations: Not on file    Relationship status: Not on file  . Intimate partner violence    Fear of current or ex partner: No    Emotionally abused: No    Physically abused: No    Forced sexual activity: No  Other Topics Concern  . Not on file  Social History Narrative   Lives alone - husband passed away 10/14/2013. 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 Denies fever, malaise, fatigue, or abrupt weight changes.  HEENT: Pt reports facial pressure, nasal congestion. Denies eye pain, eye redness, ear pain, ringing in the ears, wax buildup, runny nose, bloody nose, or sore throat. Respiratory: Pt reports cough. Denies difficulty breathing, shortness of breath, or sputum production.   Cardiovascular: Denies chest pain, chest tightness, palpitations or swelling in the hands or feet.  Gastrointestinal: Pt reports nausea, vomiting and loose stools. Denies abdominal pain, bloating, constipation, or blood in the stool.  GU: Denies urgency, frequency, pain with urination, burning sensation, blood in  urine, odor or discharge. Musculoskeletal: Pt reports body aches. Denies decrease in range of motion, difficulty with gait,  or joint pain and swelling.  Skin: Denies redness, rashes, lesions or ulcercations.   No other specific complaints in a complete review of systems (except as listed in HPI above).  Observations/Objective:  BP 99/61   Pulse 78   Temp 97.7 F (36.5 C) (Temporal)   SpO2 98%  Wt Readings from Last 3 Encounters:  04/16/19 122 lb (55.3 kg)  04/05/19 122 lb (55.3 kg)  09/14/18 126 lb (  57.2 kg)   Nose: Sounds congested; Throat/Mouth: Voice hoarse. Pulmonary/Chest: Tight dry cough noted. Neurological: Alert and oriented.    BMET    Component Value Date/Time   NA 140 04/05/2019 0838   K 3.7 04/05/2019 0838   CL 103 04/05/2019 0838   CO2 30 04/05/2019 0838   GLUCOSE 87 04/05/2019 0838   BUN 28 (H) 04/05/2019 0838   CREATININE 0.91 04/05/2019 0838   CREATININE 0.8 05/01/2013   CALCIUM 9.1 04/05/2019 0838   GFRNONAA >60 12/06/2015 1058   GFRAA >60 12/06/2015 1058    Lipid Panel     Component Value Date/Time   CHOL 142 04/05/2019 0838   TRIG 50.0 04/05/2019 0838   HDL 66.90 04/05/2019 0838   CHOLHDL 2 04/05/2019 0838   VLDL 10.0 04/05/2019 0838   LDLCALC 65 04/05/2019 0838    CBC    Component Value Date/Time   WBC 6.5 04/05/2019 0838   RBC 4.15 04/05/2019 0838   HGB 12.0 04/05/2019 0838   HGB 11.3 05/09/2013   HCT 36.9 04/05/2019 0838   PLT 246.0 04/05/2019 0838   MCV 88.9 04/05/2019 0838   MCH 29.7 12/06/2015 1058   MCHC 32.6 04/05/2019 0838   RDW 13.9 04/05/2019 0838   LYMPHSABS 1.7 04/05/2019 0838   MONOABS 0.5 04/05/2019 0838   EOSABS 0.4 04/05/2019 0838   BASOSABS 0.0 04/05/2019 0838    Hgb A1C No results found for: HGBA1C      Assessment and Plan: Acute Headache, Nasal Congestion, Gum Pain, Cough, N/V//D and Body Aches:  DDx allergies, viral URI with cough, viral GI illness vs COVID Encouraged rest and fluids Discussed  symptomatic care: Tylenol, Zyrtec x 1 week, Albuterol prn RX for Prednisone 6 day taper for tight cough Discussed self quarantine until symptoms resolve Encouraged masking, social distancing and hand washing Will hold off on COVID testing for now, she will get this done if she starts feeling worse ER precautions discussed  Follow Up Instructions:    I discussed the assessment and treatment plan with the patient. The patient was provided an opportunity to ask questions and all were answered. The patient agreed with the plan and demonstrated an understanding of the instructions.   The patient was advised to call back or seek an in-person evaluation if the symptoms worsen or if the condition fails to improve as anticipated.  I provided 6:31 minutes of non-face-to-face time during this encounter.   Webb Silversmith, NP

## 2019-07-01 ENCOUNTER — Other Ambulatory Visit: Payer: Self-pay | Admitting: Family Medicine

## 2019-07-02 NOTE — Telephone Encounter (Signed)
ERx 

## 2019-07-02 NOTE — Telephone Encounter (Signed)
Name of Medication: Ambien Name of Pharmacy: [facility] or Written Date and Quantity: 06/03/19, #30 Last Office Visit and Type: 06/24/19, acute HA with Baity Next Office Visit and Type: none Last Controlled Substance Agreement Date: none Last UDS: none

## 2019-07-03 DIAGNOSIS — Z20828 Contact with and (suspected) exposure to other viral communicable diseases: Secondary | ICD-10-CM | POA: Diagnosis not present

## 2019-07-23 ENCOUNTER — Ambulatory Visit (INDEPENDENT_AMBULATORY_CARE_PROVIDER_SITE_OTHER): Payer: Medicare HMO | Admitting: *Deleted

## 2019-07-23 DIAGNOSIS — E538 Deficiency of other specified B group vitamins: Secondary | ICD-10-CM

## 2019-07-23 MED ORDER — CYANOCOBALAMIN 1000 MCG/ML IJ SOLN
1000.0000 ug | Freq: Once | INTRAMUSCULAR | Status: AC
  Administered 2019-07-23: 1000 ug via INTRAMUSCULAR

## 2019-07-23 NOTE — Progress Notes (Signed)
Per orders of Dr. Danise Mina injection of Vitamin B12 given by Lauralyn Primes. Patient tolerated injection well.

## 2019-07-30 ENCOUNTER — Other Ambulatory Visit: Payer: Self-pay | Admitting: Family Medicine

## 2019-07-30 NOTE — Telephone Encounter (Signed)
Last filled on 07/02/2019 and LOV 06/24/2019 with Webb Silversmith for acute visit.

## 2019-07-31 NOTE — Telephone Encounter (Signed)
ERx 

## 2019-08-08 ENCOUNTER — Telehealth: Payer: Self-pay

## 2019-08-08 NOTE — Telephone Encounter (Signed)
Yes - do recommend she get covid vaccine when available.

## 2019-08-08 NOTE — Telephone Encounter (Signed)
Left message on vm per dpr relaying Dr. G's message.  

## 2019-08-08 NOTE — Telephone Encounter (Signed)
Pt left v/m wanting to verify with Dr Darnell Level it is OK for pt to get the covid vaccine. Are there any concerns or cautions. Pt request cb.

## 2019-08-16 ENCOUNTER — Telehealth: Payer: Self-pay | Admitting: *Deleted

## 2019-08-16 NOTE — Telephone Encounter (Signed)
Patient wants to know if she is finished with her vitamin B-12 shots or does she need one more?

## 2019-08-16 NOTE — Telephone Encounter (Signed)
Do recommend continued monthly b12 shot until we check levels next blood work. This could very possibly be an ongoing need.

## 2019-08-19 NOTE — Telephone Encounter (Signed)
Spoke with pt relaying Dr. Synthia Innocent message.  Verbalizes understanding and will call back to schedule NV.

## 2019-08-28 ENCOUNTER — Other Ambulatory Visit: Payer: Self-pay | Admitting: Family Medicine

## 2019-08-29 NOTE — Telephone Encounter (Signed)
Name of Medication: Ambien Name of Pharmacy: [facility] or Written Date and Quantity: 07/31/19, #30 Last Office Visit and Type: 07/24/19, acute HA, etc Next Office Visit and Type: none Last Controlled Substance Agreement Date: none Last UDS: none

## 2019-08-30 NOTE — Telephone Encounter (Signed)
ERx 

## 2019-09-03 ENCOUNTER — Other Ambulatory Visit: Payer: Self-pay

## 2019-09-03 ENCOUNTER — Ambulatory Visit (INDEPENDENT_AMBULATORY_CARE_PROVIDER_SITE_OTHER): Payer: Medicare HMO | Admitting: *Deleted

## 2019-09-03 DIAGNOSIS — E538 Deficiency of other specified B group vitamins: Secondary | ICD-10-CM

## 2019-09-03 MED ORDER — CYANOCOBALAMIN 1000 MCG/ML IJ SOLN
1000.0000 ug | Freq: Once | INTRAMUSCULAR | Status: AC
  Administered 2019-09-03: 1000 ug via INTRAMUSCULAR

## 2019-09-03 NOTE — Progress Notes (Signed)
Per orders of Dr. Gutierrez, injection of Vit B12 given by Enriqueta Augusta M. Patient tolerated injection well.  

## 2019-09-30 ENCOUNTER — Other Ambulatory Visit: Payer: Self-pay | Admitting: Family Medicine

## 2019-10-01 NOTE — Telephone Encounter (Signed)
Name of Medication: Ambien Name of Pharmacy: [facility] or Written Date and Quantity: 08/30/19, #30 Last Office Visit and Type: 06/24/19, acute; 04/16/19, AWV prt 2 Next Office Visit and Type: none Last Controlled Substance Agreement Date: none Last UDS: none

## 2019-10-03 ENCOUNTER — Other Ambulatory Visit: Payer: Self-pay

## 2019-10-03 ENCOUNTER — Ambulatory Visit (INDEPENDENT_AMBULATORY_CARE_PROVIDER_SITE_OTHER): Payer: Medicare HMO

## 2019-10-03 DIAGNOSIS — E538 Deficiency of other specified B group vitamins: Secondary | ICD-10-CM | POA: Diagnosis not present

## 2019-10-03 MED ORDER — CYANOCOBALAMIN 1000 MCG/ML IJ SOLN
1000.0000 ug | Freq: Once | INTRAMUSCULAR | Status: AC
  Administered 2019-10-03: 1000 ug via INTRAMUSCULAR

## 2019-10-03 NOTE — Progress Notes (Signed)
Per orders of Dr. Danise Mina, injection of B12 given by Pilar Grammes, CMA in Right Deltoid.. Patient tolerated injection well.

## 2019-10-04 NOTE — Telephone Encounter (Signed)
Erx

## 2019-11-05 ENCOUNTER — Ambulatory Visit (INDEPENDENT_AMBULATORY_CARE_PROVIDER_SITE_OTHER): Payer: Medicare HMO

## 2019-11-05 ENCOUNTER — Other Ambulatory Visit: Payer: Self-pay

## 2019-11-05 DIAGNOSIS — E538 Deficiency of other specified B group vitamins: Secondary | ICD-10-CM

## 2019-11-05 MED ORDER — CYANOCOBALAMIN 1000 MCG/ML IJ SOLN
1000.0000 ug | Freq: Once | INTRAMUSCULAR | Status: AC
  Administered 2019-11-05: 1000 ug via INTRAMUSCULAR

## 2019-11-05 NOTE — Progress Notes (Signed)
Per orders of Dr. Gutierrez, injection of monthly B12 1000 mcg/ml given by Alcario Tinkey P Westlynn Fifer, CMA in Left Deltoid. Patient tolerated injection well.  

## 2019-11-08 DIAGNOSIS — D2261 Melanocytic nevi of right upper limb, including shoulder: Secondary | ICD-10-CM | POA: Diagnosis not present

## 2019-11-08 DIAGNOSIS — D485 Neoplasm of uncertain behavior of skin: Secondary | ICD-10-CM | POA: Diagnosis not present

## 2019-11-08 DIAGNOSIS — L821 Other seborrheic keratosis: Secondary | ICD-10-CM | POA: Diagnosis not present

## 2019-11-08 DIAGNOSIS — C44519 Basal cell carcinoma of skin of other part of trunk: Secondary | ICD-10-CM | POA: Diagnosis not present

## 2019-11-08 DIAGNOSIS — D2262 Melanocytic nevi of left upper limb, including shoulder: Secondary | ICD-10-CM | POA: Diagnosis not present

## 2019-11-08 DIAGNOSIS — D2271 Melanocytic nevi of right lower limb, including hip: Secondary | ICD-10-CM | POA: Diagnosis not present

## 2019-11-08 DIAGNOSIS — Z85828 Personal history of other malignant neoplasm of skin: Secondary | ICD-10-CM | POA: Diagnosis not present

## 2019-11-08 DIAGNOSIS — L570 Actinic keratosis: Secondary | ICD-10-CM | POA: Diagnosis not present

## 2019-11-08 DIAGNOSIS — X32XXXA Exposure to sunlight, initial encounter: Secondary | ICD-10-CM | POA: Diagnosis not present

## 2019-11-27 ENCOUNTER — Emergency Department: Payer: Medicare HMO

## 2019-11-27 ENCOUNTER — Emergency Department
Admission: EM | Admit: 2019-11-27 | Discharge: 2019-11-27 | Disposition: A | Discharge status: Home / Self Care | Payer: Medicare HMO | Attending: Emergency Medicine | Admitting: Emergency Medicine

## 2019-11-27 DIAGNOSIS — R42 Dizziness and giddiness: Secondary | ICD-10-CM | POA: Insufficient documentation

## 2019-11-27 DIAGNOSIS — Z79899 Other long term (current) drug therapy: Secondary | ICD-10-CM | POA: Diagnosis not present

## 2019-11-27 DIAGNOSIS — R260 Ataxic gait: Secondary | ICD-10-CM | POA: Insufficient documentation

## 2019-11-27 DIAGNOSIS — I10 Essential (primary) hypertension: Secondary | ICD-10-CM | POA: Insufficient documentation

## 2019-11-27 DIAGNOSIS — I63233 Cerebral infarction due to unspecified occlusion or stenosis of bilateral carotid arteries: Secondary | ICD-10-CM | POA: Diagnosis not present

## 2019-11-27 DIAGNOSIS — R519 Headache, unspecified: Secondary | ICD-10-CM | POA: Diagnosis not present

## 2019-11-27 LAB — DIFFERENTIAL
Abs Immature Granulocytes: 0.01 10*3/uL (ref 0.00–0.07)
Basophils Absolute: 0.1 10*3/uL (ref 0.0–0.1)
Basophils Relative: 1 %
Eosinophils Absolute: 0.3 10*3/uL (ref 0.0–0.5)
Eosinophils Relative: 6 %
Immature Granulocytes: 0 %
Lymphocytes Relative: 26 %
Lymphs Abs: 1.4 10*3/uL (ref 0.7–4.0)
Monocytes Absolute: 0.5 10*3/uL (ref 0.1–1.0)
Monocytes Relative: 10 %
Neutro Abs: 3.2 10*3/uL (ref 1.7–7.7)
Neutrophils Relative %: 57 %

## 2019-11-27 LAB — APTT: aPTT: 30 seconds (ref 24–36)

## 2019-11-27 LAB — CBC
HCT: 38.7 % (ref 36.0–46.0)
Hemoglobin: 12.1 g/dL (ref 12.0–15.0)
MCH: 27.5 pg (ref 26.0–34.0)
MCHC: 31.3 g/dL (ref 30.0–36.0)
MCV: 88 fL (ref 80.0–100.0)
Platelets: 252 10*3/uL (ref 150–400)
RBC: 4.4 MIL/uL (ref 3.87–5.11)
RDW: 14.4 % (ref 11.5–15.5)
WBC: 5.5 10*3/uL (ref 4.0–10.5)
nRBC: 0 % (ref 0.0–0.2)

## 2019-11-27 LAB — COMPREHENSIVE METABOLIC PANEL
ALT: 17 U/L (ref 0–44)
AST: 10 U/L — ABNORMAL LOW (ref 15–41)
Albumin: 4.4 g/dL (ref 3.5–5.0)
Alkaline Phosphatase: 90 U/L (ref 38–126)
Anion gap: 6 (ref 5–15)
BUN: 22 mg/dL (ref 8–23)
CO2: 29 mmol/L (ref 22–32)
Calcium: 9.1 mg/dL (ref 8.9–10.3)
Chloride: 105 mmol/L (ref 98–111)
Creatinine, Ser: 0.84 mg/dL (ref 0.44–1.00)
GFR calc Af Amer: >60 mL/min (ref >60)
GFR calc non Af Amer: >60 mL/min (ref >60)
Glucose, Bld: 94 mg/dL (ref 70–99)
Potassium: 3.9 mmol/L (ref 3.5–5.1)
Sodium: 140 mmol/L (ref 135–145)
Total Bilirubin: 0.6 mg/dL (ref 0.3–1.2)
Total Protein: 7.4 g/dL (ref 6.5–8.1)

## 2019-11-27 LAB — GLUCOSE, CAPILLARY: Glucose-Capillary: 87 mg/dL (ref 70–99)

## 2019-11-27 LAB — PROTIME-INR
INR: 1 (ref 0.8–1.2)
Prothrombin Time: 12.4 seconds (ref 11.4–15.2)

## 2019-11-27 MED ORDER — SODIUM CHLORIDE 0.9% FLUSH: 3.0000 mL | Freq: Once | INTRAVENOUS | Status: DC

## 2019-11-27 MED ORDER — IOHEXOL 350 MG/ML SOLN
75.0000 mL | Freq: Once | INTRAVENOUS | Status: AC | PRN
  Administered 2019-11-27: 75 mL via INTRAVENOUS

## 2019-11-27 MED ORDER — MECLIZINE HCL 25 MG PO TABS
25.0000 mg | ORAL_TABLET | Freq: Three times a day (TID) | ORAL | 0 refills | Status: DC | PRN
Start: 2019-11-27 — End: 2020-09-01

## 2019-11-27 MED ORDER — ONDANSETRON 4 MG PO TBDP: 4.0000 mg | ORAL_TABLET | Freq: Three times a day (TID) | ORAL | 0 refills | Status: DC | PRN

## 2019-11-27 NOTE — ED Provider Notes (Signed)
[facility] Emergency Department Provider Note   ____________________________________________    I have reviewed the triage vital signs and the nursing notes.   HISTORY  Chief Complaint Code Stroke     HPI Courtney Thomas is a 74 y.o. female with history as noted below with abrupt onset dizziness making it difficult for her to ambulate.  This occurred approximately 30 minutes prior to arrival.  Reportedly the patient was at work.  She has never had this before.  No history of vertigo.  No weakness or numbness reported.  No significant headache.  She feels better when she lies down.  Has not take anything for this.  Past Medical History:  Diagnosis Date  . Adjustment disorder with depressed mood   . Asthma   . B12 deficiency anemia 2014   mild  . Frequent headaches   . History of chicken pox   . Hyperlipidemia   . Hypertension   . Insomnia   . 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 Seasonal allergic rhinitis     Patient Active Problem List   Diagnosis Date Noted  . Acute exacerbation of chronic obstructive pulmonary disease (COPD) ([place]) 09/14/2018  . Thoracic aorta atherosclerosis ([place]) 03/17/2018  . Emphysema of lung ([place]) 03/17/2018  . History of allergy to eggs 04/01/2016  . Tremor 12/08/2015  . Low back pain radiating to left lower extremity 08/06/2015  . Advanced care planning/counseling discussion 03/17/2015  . Health care maintenance 03/17/2015  . Osteoarthritis of lumbar spine 09/08/2014  . Medicare annual wellness visit, subsequent 02/21/2014  . Osteoporosis   . Vitamin D deficiency 02/13/2014  . Vitamin B12 deficiency   . Hypertension   . Hyperlipidemia   . Insomnia   . Seasonal allergic rhinitis   . Adjustment disorder with depressed mood     Past Surgical History:  Procedure Laterality Date  . ABDOMINAL HYSTERECTOMY    . BREAST CYST ASPIRATION Left 80s   benign  . BREAST EXCISIONAL BIOPSY Right 1971   neg  . COLONOSCOPY  07/2013   WNL per patient ([place])  . dexa  03/2014   T score spine -3.5, hip -2.0  . NASAL SINUS SURGERY     deviated septum  . TONSILLECTOMY  1970  . TOTAL ABDOMINAL HYSTERECTOMY W/ BILATERAL SALPINGOOPHORECTOMY  1994   uterine cyst with heavy bleeding    Prior to Admission medications   Medication Sig Start Date End Date Taking? Authorizing Provider  albuterol (VENTOLIN HFA) 108 (90 Base) MCG/ACT inhaler Inhale 2 puffs into the lungs every 6 (six) hours as needed for wheezing or shortness of breath. 11/27/18  Yes Ria Bush, MD  atorvastatin (LIPITOR) 10 MG tablet Take 1 tablet (10 mg total) by mouth daily. 04/16/19  Yes Ria Bush, MD  cetirizine (ZYRTEC) 10 MG tablet TAKE ONE TABLET BY MOUTH EVERY DAY Patient taking differently: Take 10 mg by mouth daily.  05/21/19  Yes Ria Bush, MD  cyanocobalamin (,VITAMIN B-12,) 1000 MCG/ML injection Inject 1 mL (1,000 mcg total) into the muscle every 30 (thirty) days. 04/16/19  Yes Ria Bush, MD  DULoxetine (CYMBALTA) 60 MG capsule TAKE 1 CAPSULE BY MOUTH EVERY DAY Patient taking differently: Take 60 mg by mouth daily.  04/16/19  Yes Ria Bush, MD  lisinopril-hydrochlorothiazide (ZESTORETIC) 10-12.5 MG tablet Take 1 tablet by mouth daily. 04/16/19  Yes Ria Bush, MD  zolpidem (AMBIEN) 10 MG tablet TAKE  1/2 TO 1 TABLET AT BEDTIME AS NEEDED FOR SLEEP Patient taking differently: Take 5-10 mg by mouth at bedtime as needed for sleep.  10/04/19  Yes Ria Bush, MD  buPROPion [facility] SR) 150 MG 12 hr tablet Take 1 tablet (150 mg total) by mouth daily. 04/16/19   Ria Bush, MD  Cholecalciferol (VITAMIN D3) 1.25 MG (50000 UT) TABS Take 1 tablet by mouth once a week. 04/16/19   Ria Bush, MD  meclizine (ANTIVERT) 25 MG tablet Take 1 tablet (25 mg total) by mouth 3 (three) times daily as needed for dizziness. 11/27/19   Lavonia Drafts, MD  ondansetron (ZOFRAN ODT) 4 MG disintegrating tablet Take 1 tablet (4 mg total) by mouth every 8 (eight) hours as needed. 11/27/19   Lavonia Drafts, MD     Allergies Eggs or egg-derived products, Codeine, and Penicillins  Family History  Problem Relation Age of Onset  . Cancer Mother 84       lung met to brain, smoker  . Stroke Mother   . Alzheimer's disease Mother   . Arthritis Father   . Arthritis Sister   . Cancer Brother 65       pancreatic  . Diabetes Maternal Grandmother   . Breast cancer Neg Hx     Social History Social History   Tobacco Use  . Smoking status: Former Research scientist (life sciences)  . Smokeless tobacco: Never Used  . Tobacco comment: minimal smoking as a teenager  Substance Use Topics  . Alcohol use: No  . Drug use: No    Review of Systems  Constitutional: No fever/chills Eyes: No visual changes.  ENT: No sore throat.  No tinnitus Cardiovascular: Denies chest pain. Respiratory: Denies shortness of breath. Gastrointestinal: No abdominal pain.   Genitourinary: Negative for dysuria. Musculoskeletal: Negative for back pain. Skin: Negative for rash. Neurological: As above   ____________________________________________   PHYSICAL EXAM:  VITAL SIGNS: ED Triage Vitals  Enc Vitals Group     BP 11/27/19 1101 136/70     Pulse Rate 11/27/19 1104 88     Resp 11/27/19 1101 20     Temp 11/27/19 1104 98.6 F (37 C)     Temp Source 11/27/19 1104 Oral     SpO2 11/27/19 1104 99 %     Weight 11/27/19 1100 127.6 kg (281 lb 4.9 oz)     Height 11/27/19 1100 1.499 m ('4\' 11"' )     Head Circumference --      Peak Flow --      Pain Score --      Pain Loc --      Pain Edu? --      Excl. in [place]? --     Constitutional: Alert and oriented. No acute distress.  Eyes: Conjunctivae are normal.  PERRLA, EOMI Head: Atraumatic. Nose: No congestion/rhinnorhea. Mouth/Throat: Mucous membranes are moist.    Cardiovascular: Normal rate, regular rhythm. Grossly normal heart  sounds.  Good peripheral circulation. Respiratory: Normal respiratory effort.  No retractions. Lungs CTAB. Gastrointestinal: Soft and nontender. No distention.  No CVA tenderness.  Musculoskeletal: .  Warm and well perfused Neurologic:  Normal speech and language. No gross focal neurologic deficits are appreciated.  Cranial nerves II to XII are normal NIH stroke scale of 0 Skin:  Skin is warm, dry and intact. No rash noted. Psychiatric: Mood and affect are normal. Speech and behavior are normal.  ____________________________________________   LABS (all labs ordered are listed, but only abnormal results are displayed)  Labs Reviewed  COMPREHENSIVE METABOLIC PANEL - Abnormal; Notable for the following components:      Result Value   AST 10 (*)    All other components within normal limits  PROTIME-INR  APTT  CBC  DIFFERENTIAL  GLUCOSE, CAPILLARY  CBG MONITORING, ED   ____________________________________________  EKG  ED ECG REPORT I, Lavonia Drafts, the attending physician, personally viewed and interpreted this ECG.  Date: 11/27/2019  Rhythm: normal sinus rhythm QRS Axis: normal Intervals: normal ST/T Wave abnormalities: normal Narrative Interpretation: no evidence of acute ischemia  ____________________________________________  RADIOLOGY  CT head unremarkable CT angiography of the head no large vessel disease MRI brain ____________________________________________   PROCEDURES  Procedure(s) performed: No  Procedures   Critical Care performed: yes  CRITICAL CARE Performed by: Lavonia Drafts   Total critical care time: 30 minutes  Critical care time was exclusive of separately billable procedures and treating other patients.  Critical care was necessary to treat or prevent imminent or life-threatening deterioration.  Critical care was time spent personally by me on the following activities: development of treatment plan with patient and/or surrogate as  well as nursing, discussions with consultants, evaluation of patient's response to treatment, examination of patient, obtaining history from patient or surrogate, ordering and performing treatments and interventions, ordering and review of laboratory studies, ordering and review of radiographic studies, pulse oximetry and re-evaluation of patient's condition.  ____________________________________________   INITIAL IMPRESSION / ASSESSMENT AND PLAN / ED COURSE  Pertinent labs & imaging results that were available during my care of the patient were reviewed by me and considered in my medical decision making (see chart for details).  Patient presents with abrupt onset of dizziness with ataxia.  Concern for CVA, code stroke called.  Greatly appreciate Dr. Doy Mince of neurology responding rapidly.  NIH stroke scale reassuring, Dr. Doy Mince sent for CT angiography which along with CT head was reassuring.  Lab work thus far is entirely normal.  Dr. Doy Mince recommends MR brain if normal CT angiography which we will proceed with.  Have discussed with patient and husband who agrees with plan  MRI brain is reassuring, patient appropriate for discharge with vertigo treatment, outpatient follow-up with ENT if no significant improvement in the next 1 to 2 days    ____________________________________________   FINAL CLINICAL IMPRESSION(S) / ED DIAGNOSES  Final diagnoses:  Vertigo        Note:  This document was prepared using Dragon voice recognition software and may include unintentional dictation errors.   Lavonia Drafts, MD 11/27/19 1444

## 2019-11-27 NOTE — ED Triage Notes (Addendum)
C/O acute onset of feeling dizzy. States was at work and suddenly felt dizzy.  Unable to walk without 'hitting walls'.  MAE equally and strong.  Facial movements equal.  Speech clear.  Symptoms started 0900.

## 2019-11-27 NOTE — ED Notes (Signed)
Pt on phone with MRI tech.

## 2019-11-27 NOTE — Consult Note (Signed)
Requesting Physician: Corky Downs    Chief Complaint: Dizziness  I have been asked by Dr. Corky Downs to see this patient in consultation for code stroke.  HPI: Courtney Thomas is an 74 y.o. female with a history of HTN, HLD who reports that today she had acute onset of dizziness.  Was unable to walk and keep her balance as well.  With no improvement in symptoms presented for evaluation.  Has had no previous similar symptoms.   Initial NIHSS of 0.  Date last known well: 11/27/2019 Time last known well: Time: 09:00 tPA Given: No: Minimal symptoms  Past Medical History:  Diagnosis Date  . Adjustment disorder with depressed mood   . Asthma   . B12 deficiency anemia 2014   mild  . Frequent headaches   . History of chicken pox   . Hyperlipidemia   . Hypertension   . Insomnia   . 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 Seasonal allergic rhinitis     Past Surgical History:  Procedure Laterality Date  . ABDOMINAL HYSTERECTOMY    . BREAST CYST ASPIRATION Left 80s   benign  . BREAST EXCISIONAL BIOPSY Right 1971   neg  . COLONOSCOPY  07/2013   WNL per patient ([place])  . dexa  03/2014   T score spine -3.5, hip -2.0  . NASAL SINUS SURGERY     deviated septum  . TONSILLECTOMY  1970  . TOTAL ABDOMINAL HYSTERECTOMY W/ BILATERAL SALPINGOOPHORECTOMY  1994   uterine cyst with heavy bleeding    Family History  Problem Relation Age of Onset  . Cancer Mother 86       lung met to brain, smoker  . Stroke Mother   . Alzheimer's disease Mother   . Arthritis Father   . Arthritis Sister   . Cancer Brother 68       pancreatic  . Diabetes Maternal Grandmother   . Breast cancer Neg Hx    Social History:  reports that she has quit smoking. She has never used smokeless tobacco. She reports that she does not drink alcohol or use drugs.  Allergies:  Allergies  Allergen Reactions  . Eggs Or Egg-Derived Products Anaphylaxis and Swelling  .  Codeine Nausea Only    Medications: I have reviewed the patient's current medications. Prior to Admission medications   Medication Sig Start Date End Date Taking? Authorizing Provider  albuterol (VENTOLIN HFA) 108 (90 Base) MCG/ACT inhaler Inhale 2 puffs into the lungs every 6 (six) hours as needed for wheezing or shortness of breath. 11/27/18   Ria Bush, MD  alendronate (FOSAMAX) 70 MG tablet Take 1 tablet (70 mg total) by mouth every 7 (seven) days. Take with a full glass of water on an empty stomach. 04/05/17   Ria Bush, MD  atorvastatin (LIPITOR) 10 MG tablet Take 1 tablet (10 mg total) by mouth daily. 04/16/19   Ria Bush, MD  buPROPion [facility] SR) 150 MG 12 hr tablet Take 1 tablet (150 mg total) by mouth daily. 04/16/19   Ria Bush, MD  cetirizine (ZYRTEC) 10 MG tablet TAKE ONE TABLET BY MOUTH EVERY DAY 05/21/19   Ria Bush, MD  Cholecalciferol (VITAMIN D3) 1.25 MG (50000 UT) TABS Take 1 tablet by mouth once a week. 04/16/19   Ria Bush, MD  cyanocobalamin (,VITAMIN B-12,) 1000 MCG/ML injection Inject 1 mL (1,000 mcg total) into the muscle every 30 (thirty) days. 04/16/19  Ria Bush, MD  DULoxetine (CYMBALTA) 60 MG capsule TAKE 1 CAPSULE BY MOUTH EVERY DAY 04/16/19   Ria Bush, MD  lisinopril-hydrochlorothiazide (ZESTORETIC) 10-12.5 MG tablet Take 1 tablet by mouth daily. 04/16/19   Ria Bush, MD  predniSONE (DELTASONE) 10 MG tablet Take 3 tabs on days 1-2, take 2 tabs on days 3-4, take 1 tab on days 5-6 06/24/19   Jearld Fenton, NP  zolpidem (AMBIEN) 10 MG tablet TAKE 1/2 TO 1 TABLET AT BEDTIME AS NEEDED FOR SLEEP 10/04/19   Ria Bush, MD    ROS: History obtained from the patient  General ROS: negative for - chills, fatigue, fever, night sweats, weight gain or weight loss Psychological ROS: negative for - behavioral disorder, hallucinations, memory difficulties, mood swings or suicidal ideation Ophthalmic  ROS: negative for - blurry vision, double vision, eye pain or loss of vision ENT ROS: negative for - epistaxis, nasal discharge, oral lesions, sore throat, tinnitus Allergy and Immunology ROS: negative for - hives or itchy/watery eyes Hematological and Lymphatic ROS: negative for - bleeding problems, bruising or swollen lymph nodes Endocrine ROS: negative for - galactorrhea, hair pattern changes, polydipsia/polyuria or temperature intolerance Respiratory ROS: negative for - cough, hemoptysis, shortness of breath or wheezing Cardiovascular ROS: negative for - chest pain, dyspnea on exertion, edema or irregular heartbeat Gastrointestinal ROS: negative for - abdominal pain, diarrhea, hematemesis, nausea/vomiting or stool incontinence Genito-Urinary ROS: negative for - dysuria, hematuria, incontinence or urinary frequency/urgency Musculoskeletal ROS: negative for - joint swelling or muscular weakness Neurological ROS: as noted in HPI Dermatological ROS: negative for rash and skin lesion changes  Physical Examination: Blood pressure 136/70, pulse 77, temperature 98.6 F (37 C), temperature source Oral, resp. rate 19, height '4\' 11"'  (1.499 m), weight 127.6 kg, SpO2 100 %.  HEENT-  Normocephalic, no lesions, without obvious abnormality.  Normal external eye and conjunctiva.  Normal TM's bilaterally.  Normal auditory canals and external ears. Normal external nose, mucus membranes and septum.  Normal pharynx. Cardiovascular- S1, S2 normal, pulses palpable throughout   Lungs- chest clear, no wheezing, rales, normal symmetric air entry Abdomen- soft, non-tender; bowel sounds normal; no masses,  no organomegaly Extremities- no edema Lymph-no adenopathy palpable Musculoskeletal-no joint tenderness, deformity or swelling Skin-warm and dry, no hyperpigmentation, vitiligo, or suspicious lesions  Neurological Examination   Mental Status: Alert, oriented, thought content appropriate.  Speech fluent without  evidence of aphasia.  Able to follow 3 step commands without difficulty. Cranial Nerves: II: Discs flat bilaterally; Visual fields grossly normal, pupils equal, round, reactive to light and accommodation III,IV, VI: ptosis not present, extra-ocular motions intact bilaterally V,VII: smile symmetric, facial light touch sensation normal bilaterally VIII: hearing normal bilaterally IX,X: gag reflex present XI: bilateral shoulder shrug XII: midline tongue extension Motor: Right : Upper extremity   5/5    Left:     Upper extremity   5/5  Lower extremity   5/5     Lower extremity   5/5 Tone and bulk:normal tone throughout; no atrophy noted Sensory: Pinprick and light touch intact throughout, bilaterally Deep Tendon Reflexes: Symmetric throughout Plantars: Right: mute   Left: mute Cerebellar: Normal finger-to-nose and normal heel-to-shin testing bilaterally    Laboratory Studies:  Basic Metabolic Panel: Recent Labs  Lab 11/27/19 1101  NA 140  K 3.9  CL 105  CO2 29  GLUCOSE 94  BUN 22  CREATININE 0.84  CALCIUM 9.1    Liver Function Tests: Recent Labs  Lab 11/27/19 1101  AST 10*  ALT 17  ALKPHOS 90  BILITOT 0.6  PROT 7.4  ALBUMIN 4.4   No results for input(s): LIPASE, AMYLASE in the last 168 hours. No results for input(s): AMMONIA in the last 168 hours.  CBC: Recent Labs  Lab 11/27/19 1101  WBC 5.5  NEUTROABS 3.2  HGB 12.1  HCT 38.7  MCV 88.0  PLT 252    Cardiac Enzymes: No results for input(s): CKTOTAL, CKMB, CKMBINDEX, TROPONINI in the last 168 hours.  BNP: Invalid input(s): POCBNP  CBG: Recent Labs  Lab 11/27/19 1048  GLUCAP 87    Microbiology: Results for orders placed or performed in visit on 09/18/17  Urine Culture     Status: Abnormal   Collection Time: 09/18/17 10:03 AM   Specimen: Urine  Result Value Ref Range Status   MICRO NUMBER: [id number]  Final   SPECIMEN QUALITY: ADEQUATE  Final   Sample Source URINE  Final   STATUS: FINAL  Final    ISOLATE 1: Escherichia coli (A)  Final      Susceptibility   Escherichia coli - URINE CULTURE, REFLEX    AMOX/CLAVULANIC <=2 Sensitive     AMPICILLIN <=2 Sensitive     AMPICILLIN/SULBACTAM <=2 Sensitive     CEFAZOLIN* <=4 Not Reportable      * For infections other than uncomplicated UTIcaused by E. coli, K. pneumoniae or P. mirabilis:Cefazolin is resistant if MIC > or = 8 mcg/mL.(Distinguishing susceptible versus intermediatefor isolates with MIC < or = 4 mcg/mL requiresadditional testing.)For uncomplicated UTI caused by E. coli,K. pneumoniae or P. mirabilis: Cefazolin issusceptible if MIC <32 mcg/mL and predictssusceptible to the oral agents cefaclor, cefdinir,cefpodoxime, cefprozil, cefuroxime, cephalexinand loracarbef.    CEFEPIME <=1 Sensitive     CEFTRIAXONE <=1 Sensitive     CIPROFLOXACIN <=0.25 Sensitive     LEVOFLOXACIN <=0.12 Sensitive     ERTAPENEM <=0.5 Sensitive     GENTAMICIN <=1 Sensitive     IMIPENEM <=0.25 Sensitive     NITROFURANTOIN <=16 Sensitive     PIP/TAZO <=4 Sensitive     TOBRAMYCIN <=1 Sensitive     TRIMETH/SULFA* <=20 Sensitive      * For infections other than uncomplicated UTIcaused by E. coli, K. pneumoniae or P. mirabilis:Cefazolin is resistant if MIC > or = 8 mcg/mL.(Distinguishing susceptible versus intermediatefor isolates with MIC < or = 4 mcg/mL requiresadditional testing.)For uncomplicated UTI caused by E. coli,K. pneumoniae or P. mirabilis: Cefazolin issusceptible if MIC <32 mcg/mL and predictssusceptible to the oral agents cefaclor, cefdinir,cefpodoxime, cefprozil, cefuroxime, cephalexinand loracarbef.Legend:S = Susceptible  I = IntermediateR = Resistant  NS = Not susceptible* = Not tested  NR = Not reported**NN = See antimicrobic comments    Coagulation Studies: Recent Labs    11/27/19 1101  LABPROT 12.4  INR 1.0    Urinalysis: No results for input(s): COLORURINE, LABSPEC, PHURINE, GLUCOSEU, HGBUR, BILIRUBINUR, KETONESUR, PROTEINUR, UROBILINOGEN,  NITRITE, LEUKOCYTESUR in the last 168 hours.  Invalid input(s): APPERANCEUR  Lipid Panel:    Component Value Date/Time   CHOL 142 04/05/2019 0838   TRIG 50.0 04/05/2019 0838   HDL 66.90 04/05/2019 0838   CHOLHDL 2 04/05/2019 0838   VLDL 10.0 04/05/2019 0838   LDLCALC 65 04/05/2019 0838    HgbA1C: No results found for: HGBA1C  Urine Drug Screen:  No results found for: LABOPIA, COCAINSCRNUR, LABBENZ, AMPHETMU, THCU, LABBARB  Alcohol Level: No results for input(s): ETH in the last 168 hours.  Other results: EKG: sinus rhythm at 82 bpm.  Imaging: CT ANGIO HEAD  W OR WO CONTRAST  Addendum Date: 11/27/2019   ADDENDUM REPORT: 11/27/2019 11:49 ADDENDUM: These results were called by telephone at the time of interpretation on 11/27/2019 at 11:48 am to provider Dr. Corky Downs , who verbally acknowledged these results. Electronically Signed   By: Kellie Simmering DO   On: 11/27/2019 11:49   Result Date: 11/27/2019 CLINICAL DATA:  Stroke, follow-up. Additional history provided: Unsteady gait and dizziness, headache EXAM: CT ANGIOGRAPHY HEAD AND NECK TECHNIQUE: Multidetector CT imaging of the head and neck was performed using the standard protocol during bolus administration of intravenous contrast. Multiplanar CT image reconstructions and MIPs were obtained to evaluate the vascular anatomy. Carotid stenosis measurements (when applicable) are obtained utilizing NASCET criteria, using the distal internal carotid diameter as the denominator. CONTRAST:  29m OMNIPAQUE IOHEXOL 350 MG/ML SOLN COMPARISON:  Noncontrast head CT performed earlier the same day. FINDINGS: CTA NECK FINDINGS Aortic arch: Standard aortic branching. Atherosclerotic plaque within the visualized aortic arch and proximal major branch vessels of the neck. No hemodynamically significant innominate or proximal subclavian artery stenosis. Right carotid system: CCA and ICA patent within the neck without significant stenosis (50% or greater). Mild  calcified plaque within the carotid bifurcation and proximal ICA. Left carotid system: CCA and ICA patent within the neck without significant stenosis (50% or greater) mild mixed plaque within the carotid bifurcation and proximal ICA. Vertebral arteries: The vertebral arteries are patent within the neck bilaterally without measurable stenosis. The right vertebral artery is dominant. Skeleton: No acute bony abnormality or aggressive osseous lesion. Other neck: No neck mass or lymphadenopathy.  Thyroid unremarkable. Upper chest: The imaged lung apices are emphysematous. Review of the MIP images confirms the above findings CTA HEAD FINDINGS Anterior circulation: The intracranial right internal carotid artery is patent. Calcified plaque within the cavernous segment with mild to moderate stenosis. The intracranial left internal carotid artery is patent. Calcified plaque within the cavernous segment with mild stenosis. The M1 middle cerebral arteries are patent without significant stenosis. No M2 proximal branch occlusion or high-grade proximal stenosis is identified. The anterior cerebral arteries are patent without significant proximal stenosis. No intracranial aneurysm is identified. Posterior circulation: The non dominant intracranial left vertebral artery is developmentally diminutive beyond the origin of the left PICA, but patent. The dominant intracranial right vertebral artery is patent without significant stenosis. The basilar artery is patent without significant stenosis. The posterior cerebral arteries are patent proximally without significant stenosis. There is a sizable left posterior communicating artery. The right posterior communicating artery is hypoplastic or absent. Venous sinuses: Within limitations of contrast timing, no convincing thrombus. Anatomic variants: As described Review of the MIP images confirms the above findings IMPRESSION: CTA neck: The bilateral common carotid, internal carotid and  vertebral arteries are patent within the neck without significant stenosis. Mild atherosclerotic disease within the carotid bifurcations and proximal ICAs. CTA head: 1. No intracranial large vessel occlusion or proximal high-grade arterial stenosis. 2. Calcified plaque within the cavernous internal carotid arteries bilaterally with mild/moderate stenosis on the right and mild stenosis on the left. Electronically Signed: By: KKellie SimmeringDO On: 11/27/2019 11:44   CT ANGIO NECK W OR WO CONTRAST  Addendum Date: 11/27/2019   ADDENDUM REPORT: 11/27/2019 11:49 ADDENDUM: These results were called by telephone at the time of interpretation on 11/27/2019 at 11:48 am to provider Dr. KCorky Downs, who verbally acknowledged these results. Electronically Signed   By: KKellie SimmeringDO   On: 11/27/2019 11:49   Result Date: 11/27/2019  CLINICAL DATA:  Stroke, follow-up. Additional history provided: Unsteady gait and dizziness, headache EXAM: CT ANGIOGRAPHY HEAD AND NECK TECHNIQUE: Multidetector CT imaging of the head and neck was performed using the standard protocol during bolus administration of intravenous contrast. Multiplanar CT image reconstructions and MIPs were obtained to evaluate the vascular anatomy. Carotid stenosis measurements (when applicable) are obtained utilizing NASCET criteria, using the distal internal carotid diameter as the denominator. CONTRAST:  81m OMNIPAQUE IOHEXOL 350 MG/ML SOLN COMPARISON:  Noncontrast head CT performed earlier the same day. FINDINGS: CTA NECK FINDINGS Aortic arch: Standard aortic branching. Atherosclerotic plaque within the visualized aortic arch and proximal major branch vessels of the neck. No hemodynamically significant innominate or proximal subclavian artery stenosis. Right carotid system: CCA and ICA patent within the neck without significant stenosis (50% or greater). Mild calcified plaque within the carotid bifurcation and proximal ICA. Left carotid system: CCA and ICA patent  within the neck without significant stenosis (50% or greater) mild mixed plaque within the carotid bifurcation and proximal ICA. Vertebral arteries: The vertebral arteries are patent within the neck bilaterally without measurable stenosis. The right vertebral artery is dominant. Skeleton: No acute bony abnormality or aggressive osseous lesion. Other neck: No neck mass or lymphadenopathy.  Thyroid unremarkable. Upper chest: The imaged lung apices are emphysematous. Review of the MIP images confirms the above findings CTA HEAD FINDINGS Anterior circulation: The intracranial right internal carotid artery is patent. Calcified plaque within the cavernous segment with mild to moderate stenosis. The intracranial left internal carotid artery is patent. Calcified plaque within the cavernous segment with mild stenosis. The M1 middle cerebral arteries are patent without significant stenosis. No M2 proximal branch occlusion or high-grade proximal stenosis is identified. The anterior cerebral arteries are patent without significant proximal stenosis. No intracranial aneurysm is identified. Posterior circulation: The non dominant intracranial left vertebral artery is developmentally diminutive beyond the origin of the left PICA, but patent. The dominant intracranial right vertebral artery is patent without significant stenosis. The basilar artery is patent without significant stenosis. The posterior cerebral arteries are patent proximally without significant stenosis. There is a sizable left posterior communicating artery. The right posterior communicating artery is hypoplastic or absent. Venous sinuses: Within limitations of contrast timing, no convincing thrombus. Anatomic variants: As described Review of the MIP images confirms the above findings IMPRESSION: CTA neck: The bilateral common carotid, internal carotid and vertebral arteries are patent within the neck without significant stenosis. Mild atherosclerotic disease  within the carotid bifurcations and proximal ICAs. CTA head: 1. No intracranial large vessel occlusion or proximal high-grade arterial stenosis. 2. Calcified plaque within the cavernous internal carotid arteries bilaterally with mild/moderate stenosis on the right and mild stenosis on the left. Electronically Signed: By: KKellie SimmeringDO On: 11/27/2019 11:44   CT HEAD CODE STROKE WO CONTRAST  Result Date: 11/27/2019 CLINICAL DATA:  Code stroke. Unsteady gait with dizziness. Headache that began 3 weeks ago EXAM: CT HEAD WITHOUT CONTRAST TECHNIQUE: Contiguous axial images were obtained from the base of the skull through the vertex without intravenous contrast. COMPARISON:  None available FINDINGS: Brain: No evidence of acute infarction, hemorrhage, hydrocephalus, extra-axial collection or mass lesion/mass effect. Patchy low-density in the cerebral white matter attributed to chronic small vessel ischemia. Age normal brain volume Vascular: Atherosclerotic calcification. No convincing hyperdense vessel when allowing for streak artifact to the ventral brain Skull: Normal. Negative for fracture or focal lesion. Sinuses/Orbits: No acute finding. Other: These results were called by telephone at the time of  interpretation on 11/27/2019 at 11:04 am to provider Lavonia Drafts , who verbally acknowledged these results. ASPECTS [facility] Stroke Program Early CT Score) Not scored with this history IMPRESSION: 1. No acute finding. 2. 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 Electronically Signed   By: Monte Fantasia M.D.   On: 11/27/2019 11:05    Assessment: 74 y.o. female with a history of HTN and HLD presenting with acute onset of dizziness.  Head CT personally reviewed and shows no acute changes.  CTA ordered to rule out posterior circulation thrombosis.  CTA of th head and neck personally reviewed and shows no evidence of LVO.  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   Although there is a chance that patient's presentation represents a small infarct other etiologies remain in the differential as well.  Patient with minimal symptoms and no evidence of posterior circulation compromise.  Will not administer tPA and patient not a thrombectomy candidate.    Stroke Risk Factors - hyperlipidemia and hypertension  Plan: 1. MRI of the brain without contrast.  If MRI unremarkable inpatient stroke work up not indicated.  May attempt symptomatic treatment for dizziness such as Meclizine.  If acute infarct noted on MR imaging patient to be admitted for stroke work up.  Patient within the tPA window until 1330 if worsening of symptoms.  2. ASA 70m daily  Case discussed with Dr. KCarma Lair MD Neurology [phone]/28/2021, 11:55 AM

## 2019-11-27 NOTE — ED Notes (Addendum)
Pt arrived in CT for CT angio at this time

## 2019-11-27 NOTE — ED Notes (Addendum)
CBG 87. 

## 2019-11-27 NOTE — Progress Notes (Signed)
Chaplain Responding to Code Stroke friend of patient is in room. Patient taken to Cath Lab. Provided presence with friend of patient.

## 2019-11-27 NOTE — ED Notes (Addendum)
Pt back from CT and roomed in ED 18

## 2019-12-04 DIAGNOSIS — C44519 Basal cell carcinoma of skin of other part of trunk: Secondary | ICD-10-CM | POA: Diagnosis not present

## 2019-12-05 ENCOUNTER — Ambulatory Visit (INDEPENDENT_AMBULATORY_CARE_PROVIDER_SITE_OTHER): Payer: Medicare HMO

## 2019-12-05 DIAGNOSIS — E538 Deficiency of other specified B group vitamins: Secondary | ICD-10-CM

## 2019-12-05 MED ORDER — CYANOCOBALAMIN 1000 MCG/ML IJ SOLN
1000.0000 ug | Freq: Once | INTRAMUSCULAR | Status: AC
  Administered 2019-12-05: 1000 ug via INTRAMUSCULAR

## 2019-12-05 NOTE — Progress Notes (Signed)
Per orders of Dr. Cody, injection of vit B12 given by Deanza Upperman. Patient tolerated injection well.  

## 2019-12-26 ENCOUNTER — Telehealth: Payer: Self-pay

## 2019-12-26 NOTE — Telephone Encounter (Signed)
Note Per Dr Danise Mina: Please call for an update after [facility] evaluation today. Offer OV for F/U on recent symptoms.

## 2019-12-26 NOTE — Telephone Encounter (Signed)
Pt called about nurse visit appt and mentioned to [facility] she wanted an appt to get heart checked out. I spoke with pt and for 3 wks pt has had mid to rt sided sharp on and off  CP that happens daily and pain radiates to rt side of neck. Pt has SOB all the time but pt was associating that to asthma. Now pt has tightness in mid chest and sharp pain running into rt neck. Pain level now is 8. Pt has H/A daily and now pain level is 5. Pt is having fatigue and rt hand is dropping things on a daily basis; pt is lightheaded on and off but is not lightheaded now. Advised pt per Dr Darnell Level she needs to go to ED for eval. Pt said was in ED 11/27/19 with lightheadedness but the CP and above symptoms started since seen in ED. Pt does not want to go to ED but pt will go to UC at [facility] for eval and understands if UC suggest pt will go to ED. FYI to Dr Darnell Level.

## 2019-12-26 NOTE — Telephone Encounter (Signed)
I have looked in [place] and there is not a note from [facility] as of yet.

## 2019-12-27 NOTE — Telephone Encounter (Signed)
Left message on vm per dpr asking pt to call back.  Need to see if pt was seen at [facility] and to get an update on her.

## 2019-12-28 ENCOUNTER — Other Ambulatory Visit: Payer: Self-pay | Admitting: Family Medicine

## 2019-12-28 NOTE — Telephone Encounter (Signed)
Name of Medication: Ambien Name of Pharmacy: [place] or Written Date and Quantity: 10/04/2019 2 refills, #30 Last Office Visit and Type: 06/24/19, acute; 04/16/19, AWV prt 2 Next Office Visit and Type: none Last Controlled Substance Agreement Date: none Last UDS: none

## 2019-12-28 NOTE — Telephone Encounter (Signed)
ERx 

## 2019-12-31 NOTE — Telephone Encounter (Signed)
Left message on vm per dpr asking pt to call back.  Need to see if pt was seen at [facility] and to get an update on her.

## 2020-01-01 NOTE — Telephone Encounter (Signed)
Left message on vm per dpr asking pt to call back.  Need to see if pt was seen at [facility] and to get an update on her.

## 2020-01-02 NOTE — Telephone Encounter (Signed)
Pt returning call.  [place]  She was not seen at [facility] for the sxs but is feeling wonderful now.  FYI to Dr. Darnell Level.

## 2020-01-02 NOTE — Telephone Encounter (Signed)
Left detailed message on VM per DPR with Dr Bosie Clos note.

## 2020-01-02 NOTE — Telephone Encounter (Signed)
Would still offer OV for further eval of symptoms if desired.

## 2020-01-07 ENCOUNTER — Ambulatory Visit (INDEPENDENT_AMBULATORY_CARE_PROVIDER_SITE_OTHER): Payer: Medicare HMO

## 2020-01-07 ENCOUNTER — Other Ambulatory Visit: Payer: Self-pay

## 2020-01-07 DIAGNOSIS — E538 Deficiency of other specified B group vitamins: Secondary | ICD-10-CM

## 2020-01-07 MED ORDER — CYANOCOBALAMIN 1000 MCG/ML IJ SOLN
1000.0000 ug | Freq: Once | INTRAMUSCULAR | Status: AC
  Administered 2020-01-07: 1000 ug via INTRAMUSCULAR

## 2020-01-07 NOTE — Progress Notes (Signed)
Pt given monthly B12 injection 1051mcg/ml in left deltoid. Tolerated well.

## 2020-02-01 ENCOUNTER — Other Ambulatory Visit: Payer: Self-pay | Admitting: Family Medicine

## 2020-02-13 ENCOUNTER — Other Ambulatory Visit: Payer: Self-pay

## 2020-02-13 ENCOUNTER — Ambulatory Visit (INDEPENDENT_AMBULATORY_CARE_PROVIDER_SITE_OTHER): Payer: Medicare HMO | Admitting: *Deleted

## 2020-02-13 DIAGNOSIS — E538 Deficiency of other specified B group vitamins: Secondary | ICD-10-CM

## 2020-02-13 MED ORDER — CYANOCOBALAMIN 1000 MCG/ML IJ SOLN
1000.0000 ug | Freq: Once | INTRAMUSCULAR | Status: AC
  Administered 2020-02-13: 1000 ug via INTRAMUSCULAR

## 2020-02-13 NOTE — Progress Notes (Signed)
Per orders of Dr. Danise Mina, injection of B12 1000 mcg/mL given by Calder Oblinger. Patient tolerated injection well.

## 2020-02-18 ENCOUNTER — Ambulatory Visit: Payer: Medicare HMO

## 2020-03-02 ENCOUNTER — Other Ambulatory Visit: Payer: Self-pay | Admitting: Family Medicine

## 2020-03-03 DIAGNOSIS — L308 Other specified dermatitis: Secondary | ICD-10-CM | POA: Diagnosis not present

## 2020-03-17 ENCOUNTER — Other Ambulatory Visit: Payer: Self-pay

## 2020-03-17 ENCOUNTER — Ambulatory Visit (INDEPENDENT_AMBULATORY_CARE_PROVIDER_SITE_OTHER): Payer: Medicare HMO

## 2020-03-17 DIAGNOSIS — E538 Deficiency of other specified B group vitamins: Secondary | ICD-10-CM

## 2020-03-17 MED ORDER — CYANOCOBALAMIN 1000 MCG/ML IJ SOLN
1000.0000 ug | Freq: Once | INTRAMUSCULAR | Status: AC
  Administered 2020-03-17: 1000 ug via INTRAMUSCULAR

## 2020-03-17 NOTE — Progress Notes (Signed)
Per orders of Dr. Duncan, injection of vit B12 given by Massa Pe. Patient tolerated injection well.  

## 2020-03-25 ENCOUNTER — Other Ambulatory Visit: Payer: Self-pay | Admitting: Family Medicine

## 2020-03-26 NOTE — Telephone Encounter (Signed)
ERx 

## 2020-04-20 DIAGNOSIS — D2271 Melanocytic nevi of right lower limb, including hip: Secondary | ICD-10-CM | POA: Diagnosis not present

## 2020-04-20 DIAGNOSIS — D2261 Melanocytic nevi of right upper limb, including shoulder: Secondary | ICD-10-CM | POA: Diagnosis not present

## 2020-04-20 DIAGNOSIS — D2262 Melanocytic nevi of left upper limb, including shoulder: Secondary | ICD-10-CM | POA: Diagnosis not present

## 2020-04-20 DIAGNOSIS — Z85828 Personal history of other malignant neoplasm of skin: Secondary | ICD-10-CM | POA: Diagnosis not present

## 2020-04-20 DIAGNOSIS — L820 Inflamed seborrheic keratosis: Secondary | ICD-10-CM | POA: Diagnosis not present

## 2020-04-20 DIAGNOSIS — R208 Other disturbances of skin sensation: Secondary | ICD-10-CM | POA: Diagnosis not present

## 2020-04-20 DIAGNOSIS — D225 Melanocytic nevi of trunk: Secondary | ICD-10-CM | POA: Diagnosis not present

## 2020-04-20 DIAGNOSIS — L570 Actinic keratosis: Secondary | ICD-10-CM | POA: Diagnosis not present

## 2020-04-20 DIAGNOSIS — L538 Other specified erythematous conditions: Secondary | ICD-10-CM | POA: Diagnosis not present

## 2020-04-22 ENCOUNTER — Other Ambulatory Visit: Payer: Self-pay

## 2020-04-22 ENCOUNTER — Ambulatory Visit (INDEPENDENT_AMBULATORY_CARE_PROVIDER_SITE_OTHER): Payer: Medicare HMO

## 2020-04-22 DIAGNOSIS — Z23 Encounter for immunization: Secondary | ICD-10-CM | POA: Diagnosis not present

## 2020-04-22 DIAGNOSIS — E538 Deficiency of other specified B group vitamins: Secondary | ICD-10-CM | POA: Diagnosis not present

## 2020-04-22 MED ORDER — CYANOCOBALAMIN 1000 MCG/ML IJ SOLN
1000.0000 ug | Freq: Once | INTRAMUSCULAR | Status: AC
  Administered 2020-04-22: 1000 ug via INTRAMUSCULAR

## 2020-04-22 NOTE — Progress Notes (Signed)
Per orders of Dr. Danise Mina, monthly injection of B12, given by Loreen Freud, in left deltoid.  Patient tolerated injection well.

## 2020-05-04 ENCOUNTER — Other Ambulatory Visit: Payer: Self-pay | Admitting: Family Medicine

## 2020-05-05 NOTE — Telephone Encounter (Signed)
ERx 

## 2020-05-05 NOTE — Telephone Encounter (Signed)
Refill request Ambien Last refill 03/26/20 #30 Last office visit 06/24/19 acute

## 2020-05-05 NOTE — Telephone Encounter (Signed)
Name of Medication: Ambien Name of Pharmacy: [facility] or Written Date and Quantity: 03/27/20, #30 Last Office Visit and Type: 04/16/19, AWV prt 2 Next Office Visit and Type: none Last Controlled Substance Agreement Date: none Last UDS: none

## 2020-05-08 ENCOUNTER — Other Ambulatory Visit: Payer: Self-pay | Admitting: Family Medicine

## 2020-05-08 DIAGNOSIS — Z1231 Encounter for screening mammogram for malignant neoplasm of breast: Secondary | ICD-10-CM

## 2020-05-21 ENCOUNTER — Ambulatory Visit (INDEPENDENT_AMBULATORY_CARE_PROVIDER_SITE_OTHER): Payer: Medicare HMO

## 2020-05-21 ENCOUNTER — Other Ambulatory Visit: Payer: Self-pay

## 2020-05-21 DIAGNOSIS — E538 Deficiency of other specified B group vitamins: Secondary | ICD-10-CM | POA: Diagnosis not present

## 2020-05-21 MED ORDER — CYANOCOBALAMIN 1000 MCG/ML IJ SOLN
1000.0000 ug | Freq: Once | INTRAMUSCULAR | Status: AC
  Administered 2020-05-21: 1000 ug via INTRAMUSCULAR

## 2020-05-21 NOTE — Progress Notes (Signed)
Per orders of Dr. Duncan, injection of vit B12 given by Arnika Larzelere. Patient tolerated injection well.  

## 2020-05-26 ENCOUNTER — Other Ambulatory Visit: Payer: Self-pay | Admitting: Family Medicine

## 2020-05-26 NOTE — Telephone Encounter (Signed)
E-scribed refills.  Plz schedule wellness, cpe and lab visits for additional refills.

## 2020-05-27 NOTE — Telephone Encounter (Signed)
Scheduled MWV, CPE, and labs. EM 

## 2020-06-11 DIAGNOSIS — M25511 Pain in right shoulder: Secondary | ICD-10-CM | POA: Diagnosis not present

## 2020-06-15 ENCOUNTER — Other Ambulatory Visit: Payer: Self-pay

## 2020-06-15 ENCOUNTER — Ambulatory Visit
Admission: RE | Admit: 2020-06-15 | Discharge: 2020-06-15 | Disposition: A | Discharge status: Home / Self Care | Payer: Medicare HMO | Source: Ambulatory Visit | Attending: Family Medicine | Admitting: Family Medicine

## 2020-06-15 DIAGNOSIS — Z1231 Encounter for screening mammogram for malignant neoplasm of breast: Secondary | ICD-10-CM | POA: Diagnosis not present

## 2020-06-19 ENCOUNTER — Other Ambulatory Visit: Payer: Self-pay | Admitting: Family Medicine

## 2020-06-19 DIAGNOSIS — R928 Other abnormal and inconclusive findings on diagnostic imaging of breast: Secondary | ICD-10-CM

## 2020-06-19 DIAGNOSIS — N6310 Unspecified lump in the right breast, unspecified quadrant: Secondary | ICD-10-CM

## 2020-06-22 ENCOUNTER — Other Ambulatory Visit: Payer: Self-pay | Admitting: Family Medicine

## 2020-06-23 ENCOUNTER — Other Ambulatory Visit: Payer: Self-pay

## 2020-06-23 ENCOUNTER — Ambulatory Visit (INDEPENDENT_AMBULATORY_CARE_PROVIDER_SITE_OTHER): Payer: Medicare HMO | Admitting: *Deleted

## 2020-06-23 DIAGNOSIS — E538 Deficiency of other specified B group vitamins: Secondary | ICD-10-CM

## 2020-06-23 MED ORDER — CYANOCOBALAMIN 1000 MCG/ML IJ SOLN
1000.0000 ug | Freq: Once | INTRAMUSCULAR | Status: AC
  Administered 2020-06-23: 1000 ug via INTRAMUSCULAR

## 2020-06-23 NOTE — Progress Notes (Signed)
Per orders of Dr.Gutierrez, injection of Vitamin B12 given by Rosco Harriott Simpson.  Patient tolerated injection well. 

## 2020-06-29 ENCOUNTER — Other Ambulatory Visit: Payer: Self-pay | Admitting: Family Medicine

## 2020-06-29 NOTE — Telephone Encounter (Signed)
ERx 

## 2020-06-29 NOTE — Telephone Encounter (Signed)
Name of Medication: Ambien Name of Pharmacy: [facility] or Written Date and Quantity: 05/05/20, #30 Last Office Visit and Type: 04/16/19, AWV prt 2  Next Office Visit and Type: 09/01/20, AWV prt 2 Last Controlled Substance Agreement Date: none Last UDS: none

## 2020-07-20 ENCOUNTER — Ambulatory Visit
Admission: RE | Admit: 2020-07-20 | Discharge: 2020-07-20 | Disposition: A | Discharge status: Home / Self Care | Payer: Medicare HMO | Source: Ambulatory Visit | Attending: Family Medicine | Admitting: Family Medicine

## 2020-07-20 ENCOUNTER — Other Ambulatory Visit: Payer: Self-pay

## 2020-07-20 DIAGNOSIS — N6310 Unspecified lump in the right breast, unspecified quadrant: Secondary | ICD-10-CM | POA: Insufficient documentation

## 2020-07-20 DIAGNOSIS — R928 Other abnormal and inconclusive findings on diagnostic imaging of breast: Secondary | ICD-10-CM

## 2020-07-20 DIAGNOSIS — N6001 Solitary cyst of right breast: Secondary | ICD-10-CM | POA: Diagnosis not present

## 2020-07-28 ENCOUNTER — Ambulatory Visit (INDEPENDENT_AMBULATORY_CARE_PROVIDER_SITE_OTHER): Payer: Medicare HMO

## 2020-07-28 ENCOUNTER — Other Ambulatory Visit: Payer: Self-pay

## 2020-07-28 DIAGNOSIS — E538 Deficiency of other specified B group vitamins: Secondary | ICD-10-CM

## 2020-07-28 MED ORDER — CYANOCOBALAMIN 1000 MCG/ML IJ SOLN
1000.0000 ug | Freq: Once | INTRAMUSCULAR | Status: AC
  Administered 2020-07-28: 1000 ug via INTRAMUSCULAR

## 2020-07-28 NOTE — Progress Notes (Signed)
Per orders of Dr. Javier Gutierrez, injection of B-12 given by Dimetrius Montfort Y Jaeger Trueheart in right deltoid. Patient tolerated injection well. Patient will make appointment for 1 month.    

## 2020-08-06 ENCOUNTER — Other Ambulatory Visit: Payer: Self-pay | Admitting: Family Medicine

## 2020-08-07 ENCOUNTER — Telehealth: Payer: Self-pay

## 2020-08-07 NOTE — Telephone Encounter (Signed)
Received voice mail from patient that she had not received refill on her cymbalta. Our records indicate that refill was sent on 08/06/2020. I have called and l/m for her to call office to let her know that and make sure received from pharmacy.

## 2020-08-07 NOTE — Telephone Encounter (Signed)
Medication was sent to patients pharmacy. I called patient left vm explaining

## 2020-08-18 ENCOUNTER — Other Ambulatory Visit: Payer: Self-pay | Admitting: Family Medicine

## 2020-08-18 NOTE — Telephone Encounter (Signed)
Requesting:ambien Contract:no UDS:n/a Last OV: 03/17/20 Next OV:09/01/20 Last Refill: 06/29/20 [id number] Database:   Please advise

## 2020-08-19 NOTE — Telephone Encounter (Signed)
ERx 

## 2020-08-25 ENCOUNTER — Ambulatory Visit: Payer: Medicare HMO

## 2020-08-26 ENCOUNTER — Other Ambulatory Visit: Payer: Self-pay | Admitting: Family Medicine

## 2020-08-26 ENCOUNTER — Ambulatory Visit (INDEPENDENT_AMBULATORY_CARE_PROVIDER_SITE_OTHER): Payer: Medicare HMO

## 2020-08-26 DIAGNOSIS — E785 Hyperlipidemia, unspecified: Secondary | ICD-10-CM

## 2020-08-26 DIAGNOSIS — E538 Deficiency of other specified B group vitamins: Secondary | ICD-10-CM

## 2020-08-26 DIAGNOSIS — Z0000 Encounter for general adult medical examination without abnormal findings: Secondary | ICD-10-CM

## 2020-08-26 DIAGNOSIS — E559 Vitamin D deficiency, unspecified: Secondary | ICD-10-CM

## 2020-08-26 NOTE — Patient Instructions (Signed)
Courtney Thomas , Thank you for taking time to come for your Medicare Wellness Visit. I appreciate your ongoing commitment to your health goals. Please review the following plan we discussed and let me know if I can assist you in the future.   Screening recommendations/referrals: Colonoscopy: Up to date, completed 08/01/2013, due 08/2023 Mammogram: Up to date, completed 06/15/2020, due 06/2021 Bone Density: Up to date, completed 06/12/2019, due 06/2021 Recommended yearly ophthalmology/optometry visit for glaucoma screening and checkup Recommended yearly dental visit for hygiene and checkup  Vaccinations: Influenza vaccine: Up to date, completed 04/22/2020, due 03/2021 Pneumococcal vaccine: Completed series Tdap vaccine: Up to date, completed 08/02/2011, due 08/2021 Shingles vaccine: due, check with your insurance regarding coverage if interested    Covid-19:Completed series  Advanced directives: Please bring a copy of your POA (Power of [place]) and/or Living Will to your next appointment.   Conditions/risks identified: hypertension, hyperlipidemia   Next appointment: Follow up in one year for your annual wellness visit    Preventive Care 22 Years and Older, Female Preventive care refers to lifestyle choices and visits with your health care provider that can promote health and wellness. What does preventive care include?  A yearly physical exam. This is also called an annual well check.  Dental exams once or twice a year.  Routine eye exams. Ask your health care provider how often you should have your eyes checked.  Personal lifestyle choices, including:  Daily care of your teeth and gums.  Regular physical activity.  Eating a healthy diet.  Avoiding tobacco and drug use.  Limiting alcohol use.  Practicing safe sex.  Taking low-dose aspirin every day.  Taking vitamin and mineral supplements as recommended by your health care provider. What happens during an annual well  check? The services and screenings done by your health care provider during your annual well check will depend on your age, overall health, lifestyle risk factors, and family history of disease. Counseling  Your health care provider may ask you questions about your:  Alcohol use.  Tobacco use.  Drug use.  Emotional well-being.  Home and relationship well-being.  Sexual activity.  Eating habits.  History of falls.  Memory and ability to understand (cognition).  Work and work Statistician.  Reproductive health. Screening  You may have the following tests or measurements:  Height, weight, and BMI.  Blood pressure.  Lipid and cholesterol levels. These may be checked every 5 years, or more frequently if you are over 9 years old.  Skin check.  Lung cancer screening. You may have this screening every year starting at age 31 if you have a 30-pack-year history of smoking and currently smoke or have quit within the past 15 years.  Fecal occult blood test (FOBT) of the stool. You may have this test every year starting at age 50.  Flexible sigmoidoscopy or colonoscopy. You may have a sigmoidoscopy every 5 years or a colonoscopy every 10 years starting at age 11.  Hepatitis C blood test.  Hepatitis B blood test.  Sexually transmitted disease (STD) testing.  Diabetes screening. This is done by checking your blood sugar (glucose) after you have not eaten for a while (fasting). You may have this done every 1-3 years.  Bone density scan. This is done to screen for osteoporosis. You may have this done starting at age 47.  Mammogram. This may be done every 1-2 years. Talk to your health care provider about how often you should have regular mammograms. Talk with your health  care provider about your test results, treatment options, and if necessary, the need for more tests. Vaccines  Your health care provider may recommend certain vaccines, such as:  Influenza vaccine. This is  recommended every year.  Tetanus, diphtheria, and acellular pertussis (Tdap, Td) vaccine. You may need a Td booster every 10 years.  Zoster vaccine. You may need this after age 90.  Pneumococcal 13-valent conjugate (PCV13) vaccine. One dose is recommended after age 98.  Pneumococcal polysaccharide (PPSV23) vaccine. One dose is recommended after age 40. Talk to your health care provider about which screenings and vaccines you need and how often you need them. This information is not intended to replace advice given to you by your health care provider. Make sure you discuss any questions you have with your health care provider. Document Released: 08/14/2015 Document Revised: 04/06/2016 Document Reviewed: 05/19/2015 Elsevier Interactive Patient Education  2017 [place] Prevention in the Home Falls can cause injuries. They can happen to people of all ages. There are many things you can do to make your home safe and to help prevent falls. What can I do on the outside of my home?  Regularly fix the edges of walkways and driveways and fix any cracks.  Remove anything that might make you trip as you walk through a door, such as a raised step or threshold.  Trim any bushes or trees on the path to your home.  Use bright outdoor lighting.  Clear any walking paths of anything that might make someone trip, such as rocks or tools.  Regularly check to see if handrails are loose or broken. Make sure that both sides of any steps have handrails.  Any raised decks and porches should have guardrails on the edges.  Have any leaves, snow, or ice cleared regularly.  Use sand or salt on walking paths during winter.  Clean up any spills in your garage right away. This includes oil or grease spills. What can I do in the bathroom?  Use night lights.  Install grab bars by the toilet and in the tub and shower. Do not use towel bars as grab bars.  Use non-skid mats or decals in the tub or  shower.  If you need to sit down in the shower, use a plastic, non-slip stool.  Keep the floor dry. Clean up any water that spills on the floor as soon as it happens.  Remove soap buildup in the tub or shower regularly.  Attach bath mats securely with double-sided non-slip rug tape.  Do not have throw rugs and other things on the floor that can make you trip. What can I do in the bedroom?  Use night lights.  Make sure that you have a light by your bed that is easy to reach.  Do not use any sheets or blankets that are too big for your bed. They should not hang down onto the floor.  Have a firm chair that has side arms. You can use this for support while you get dressed.  Do not have throw rugs and other things on the floor that can make you trip. What can I do in the kitchen?  Clean up any spills right away.  Avoid walking on wet floors.  Keep items that you use a lot in easy-to-reach places.  If you need to reach something above you, use a strong step stool that has a grab bar.  Keep electrical cords out of the way.  Do not use floor  polish or wax that makes floors slippery. If you must use wax, use non-skid floor wax.  Do not have throw rugs and other things on the floor that can make you trip. What can I do with my stairs?  Do not leave any items on the stairs.  Make sure that there are handrails on both sides of the stairs and use them. Fix handrails that are broken or loose. Make sure that handrails are as long as the stairways.  Check any carpeting to make sure that it is firmly attached to the stairs. Fix any carpet that is loose or worn.  Avoid having throw rugs at the top or bottom of the stairs. If you do have throw rugs, attach them to the floor with carpet tape.  Make sure that you have a light switch at the top of the stairs and the bottom of the stairs. If you do not have them, ask someone to add them for you. What else can I do to help prevent  falls?  Wear shoes that:  Do not have high heels.  Have rubber bottoms.  Are comfortable and fit you well.  Are closed at the toe. Do not wear sandals.  If you use a stepladder:  Make sure that it is fully opened. Do not climb a closed stepladder.  Make sure that both sides of the stepladder are locked into place.  Ask someone to hold it for you, if possible.  Clearly mark and make sure that you can see:  Any grab bars or handrails.  First and last steps.  Where the edge of each step is.  Use tools that help you move around (mobility aids) if they are needed. These include:  Canes.  Walkers.  Scooters.  Crutches.  Turn on the lights when you go into a dark area. Replace any light bulbs as soon as they burn out.  Set up your furniture so you have a clear path. Avoid moving your furniture around.  If any of your floors are uneven, fix them.  If there are any pets around you, be aware of where they are.  Review your medicines with your doctor. Some medicines can make you feel dizzy. This can increase your chance of falling. Ask your doctor what other things that you can do to help prevent falls. This information is not intended to replace advice given to you by your health care provider. Make sure you discuss any questions you have with your health care provider. Document Released: 05/14/2009 Document Revised: 12/24/2015 Document Reviewed: 08/22/2014 Elsevier Interactive Patient Education  2017 [employer].

## 2020-08-26 NOTE — Progress Notes (Signed)
Subjective:   Courtney Thomas is a 75 y.o. female who presents for Medicare Annual (Subsequent) preventive examination.  Review of Systems: N/A      I connected with the patient today by telephone and verified that I am speaking with the correct person using two identifiers. Location patient: home Location nurse: work Persons participating in the telephone visit: patient, nurse.   I discussed the limitations, risks, security and privacy concerns of performing an evaluation and management service by telephone and the availability of in person appointments. I also discussed with the patient that there may be a patient responsible charge related to this service. The patient expressed understanding and verbally consented to this telephonic visit.        Cardiac Risk Factors include: advanced age (>38mn, >>80women);hypertension;Other (see comment), Risk factor comments: hyperlipidemia     Objective:    Today's Vitals   There is no height or weight on file to calculate BMI.  Advanced Directives 08/26/2020 04/05/2019 03/28/2018 03/27/2017 03/25/2016 12/06/2015  Does Patient Have a Medical Advance Directive? Yes Yes Yes Yes Yes Yes  Type of AParamed[place]of [place]ow[place] will Living will [place] Living will;Healthcare Power of [place]Living will [place] will  Does patient want to make changes to medical advance directive? - - - - No - Patient declined No - Patient declined  Copy of HF[place]in Chart? No - copy requested - No - copy requested No - copy requested No - copy requested No - copy requested    Current Medications (verified) Outpatient Encounter Medications as of 08/26/2020  Medication Sig  . albuterol (VENTOLIN HFA) 108 (90 Base) MCG/ACT inhaler Inhale 2 puffs into the lungs every 6 (six) hours as needed for wheezing or shortness of breath.  .Marland Kitchen atorvastatin (LIPITOR) 10 MG tablet TAKE ONE TABLET BY MOUTH EVERY DAY  . buPROPion (WELLBUTRIN SR) 150 MG 12 hr tablet TAKE 1 TABLET BY MOUTH DAILY  . cetirizine (ZYRTEC) 10 MG tablet TAKE ONE TABLET BY MOUTH EVERY DAY  . Cholecalciferol (VITAMIN D3) 1.25 MG (50000 UT) TABS Take 1 tablet by mouth once a week.  . DULoxetine (CYMBALTA) 60 MG capsule TAKE 1 CAPSULE BY MOUTH ONCE DAILY  . lisinopril-hydrochlorothiazide (ZESTORETIC) 10-12.5 MG tablet TAKE 1 TABLET BY MOUTH DAILY  . meclizine (ANTIVERT) 25 MG tablet Take 1 tablet (25 mg total) by mouth 3 (three) times daily as needed for dizziness.  . ondansetron (ZOFRAN ODT) 4 MG disintegrating tablet Take 1 tablet (4 mg total) by mouth every 8 (eight) hours as needed.  . zolpidem (AMBIEN) 10 MG tablet TAKE 1/2 TO 1 TABLET AT BEDTIME AS NEEDED FOR SLEEP   No facility-administered encounter medications on file as of 08/26/2020.    Allergies (verified) Eggs or egg-derived products, Codeine, and Penicillins   History: Past Medical History:  Diagnosis Date  . Adjustment disorder with depressed mood   . Asthma   . B12 deficiency anemia 2014   mild  . Frequent headaches   . History of chicken pox   . Hyperlipidemia   . Hypertension   . Insomnia   . 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 Seasonal allergic rhinitis    Past Surgical History:  Procedure Laterality Date  . ABDOMINAL HYSTERECTOMY    . BREAST CYST ASPIRATION Left 80s   benign  .  BREAST EXCISIONAL BIOPSY Right 1971   neg  . COLONOSCOPY  07/2013   WNL per patient ([place])  . dexa  03/2014   T score spine -3.5, hip -2.0  . NASAL SINUS SURGERY     deviated septum  . TONSILLECTOMY  1970  . TOTAL ABDOMINAL HYSTERECTOMY W/ BILATERAL SALPINGOOPHORECTOMY  1994   uterine cyst with heavy bleeding   Family History  Problem Relation Age of Onset  . Cancer Mother 50       lung met to brain, smoker  . Stroke Mother   .  Alzheimer's disease Mother   . Arthritis Father   . Arthritis Sister   . Cancer Brother 65       pancreatic  . Diabetes Maternal Grandmother   . Breast cancer Neg Hx    Social History   Socioeconomic History  . Marital status: Widowed    Spouse name: Not on file  . Number of children: Not on file  . Years of education: Not on file  . Highest education level: Not on file  Occupational History  . Not on file  Tobacco Use  . Smoking status: Former Research scientist (life sciences)  . Smokeless tobacco: Never Used  . Tobacco comment: minimal smoking as a teenager  Vaping Use  . Vaping Use: Never used  Substance and Sexual Activity  . Alcohol use: No  . Drug use: No  . Sexual activity: Not Currently  Other Topics Concern  . Not on file  Social History Narrative   Lives alone - husband passed away 10/11/2013. 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 Difficulty of Paying Living Expenses: Not hard at all  Food Insecurity: No Food Insecurity  . Worried About Charity fundraiser in the Last Year: Never true  . Ran Out of Food in the Last Year: Never true  Transportation Needs: No Transportation Needs  . Lack of Transportation (Medical): No  . Lack of Transportation (Non-Medical): No  Physical Activity: Inactive  . Days of Exercise per Week: 0 days  . Minutes of Exercise per Session: 0 min  Stress: Stress Concern Present  . Feeling of Stress : Very much  Social Connections: Not on file    Tobacco Counseling Counseling given: Not Answered Comment: minimal smoking as a teenager   Clinical Intake:  Pre-visit preparation completed: Yes  Pain : No/denies pain     Nutritional Risks: None Diabetes: No  How often do you need to have someone help you when you read instructions, pamphlets,  or other written materials from your doctor or pharmacy?: 1 - Never What is the last grade level you completed in school?: bachelors  Diabetic: No Nutrition Risk Assessment:  Has the patient had any N/V/D within the last 2 months?  No  Does the patient have any non-healing wounds?  No  Has the patient had any unintentional weight loss or weight gain?  No   Diabetes:  Is the patient diabetic?  No  If diabetic, was a CBG obtained today?  N/A Did the patient bring in their glucometer from home?  N/A How often do you monitor your CBG's? N/A.   Financial Strains and Diabetes Management:  Are you having any financial strains with the device, your supplies or your medication? N/A.  Does the patient want to be seen by Chronic Care Management for management of their diabetes?  N/A Would the patient like to be referred to a Nutritionist or for Diabetic Management?  N/A    Interpreter Needed?: No  Information entered by :: CJohnson, LPN   Activities of Daily Living In your present state of health, do you have any difficulty performing the following activities: 08/26/2020  Hearing? N  Vision? N  Difficulty concentrating or making decisions? N  Walking or climbing stairs? N  Dressing or bathing? N  Doing errands, shopping? N  Preparing Food and eating ? N  Using the Toilet? N  In the past six months, have you accidently leaked urine? N  Do you have problems with loss of bowel control? N  Managing your Medications? N  Managing your Finances? N  Housekeeping or managing your Housekeeping? N  Some recent data might be hidden    Patient Care Team: Ria Bush, MD as PCP - General (Family Medicine) Lorelee Cover., MD as Consulting Physician (Ophthalmology) Mellissa Kohut, DDS, PA as Consulting Physician (Dentistry)  Indicate any recent Medical Services you may have received from other than [facility] providers in the past year (date may be approximate).     Assessment:   This is  a routine wellness examination for Floreine.  Hearing/Vision screen  Hearing Screening   '125Hz'  '250Hz'  '500Hz'  '1000Hz'  '2000Hz'  '3000Hz'  '4000Hz'  '6000Hz'  '8000Hz'   Right ear:           Left ear:           Vision Screening Comments: Patient gets annual eye exams   Dietary issues and exercise activities discussed: Current Exercise Habits: The patient does not participate in regular exercise at present, Exercise limited by: None identified  Goals    . Patient Stated     Starting 03/28/2018, I will continue to take medications as prescribed.     . Patient Stated     08/26/2020, I will maintain and continue medications as prescribed.     . Weight (lb) < 200 lb (90.7 kg)     04/05/2019, wants to weigh 110 pounds      Depression Screen PHQ 2/9 Scores 08/26/2020 04/05/2019 03/28/2018 03/21/2018 03/27/2017 03/25/2016 03/17/2015  PHQ - 2 Score 0 0 0 0 0 1 0  PHQ- 9 Score 0 3 0 4 1 - -    Fall Risk Fall Risk  08/26/2020 04/05/2019 03/28/2018 03/27/2017 03/25/2016  Falls in the past year? 0 0 No No No  Number falls in past yr: 0 - - - -  Comment - - - - -  Injury with Fall? 0 - - - -  Comment - - - - -  Risk for fall due to : Medication side effect Medication side effect - - -  Follow up Falls evaluation completed;Falls prevention discussed Falls evaluation completed;Falls prevention discussed - - -    FALL RISK PREVENTION PERTAINING TO THE HOME:  Any stairs in or around the home? Yes  If so, are there any without handrails? No  Home free of loose throw rugs in walkways, pet beds, electrical cords, etc? Yes  Adequate lighting in your home to reduce risk of falls? Yes   ASSISTIVE DEVICES UTILIZED TO PREVENT FALLS:  Life alert? No  Use of a cane, walker or w/c? No  Grab bars in the bathroom? No  Shower chair or bench in shower? No  Elevated toilet seat or a handicapped toilet? No   TIMED  UP AND GO:  Was the test performed? N/A telephone visit.    Cognitive Function: MMSE - Mini Mental State Exam  08/26/2020 04/05/2019 03/28/2018 03/27/2017 03/25/2016  Orientation to time '5 5 5 5 5  ' Orientation to Place '5 5 5 5 5  ' Registration '3 3 3 3 3  ' Attention/ Calculation 5 5 0 0 0  Recall '3 3 2 3 3  ' Recall-comments - - unable to recall 1 of 3 words - -  Language- name 2 objects - 0 0 0 0  Language- repeat '1 1 1 1 1  ' Language- follow 3 step command - 0 '3 3 3  ' Language- read & follow direction - 0 0 0 0  Write a sentence - 0 0 0 0  Copy design - 0 0 0 0  Total score - '22 19 20 20  ' Mini Cog  Mini-Cog screen was completed. Maximum score is 22. A value of 0 denotes this part of the MMSE was not completed or the patient failed this part of the Mini-Cog screening.       Immunizations Immunization History  Administered Date(s) Administered  . Influenza Inj Mdck Quad Pf 04/16/2019, 04/22/2020  . Influenza, Quadrivalent, Recombinant, Inj, Pf 04/01/2016, 05/09/2017  . PFIZER(Purple Top)SARS-COV-2 Vaccination 09/02/2019, 09/23/2019, 05/04/2020  . Pneumococcal Conjugate-13 08/27/2015  . Pneumococcal Polysaccharide-23 04/05/2017  . Tdap 08/02/2011    TDAP status: Up to date  Flu Vaccine status: Up to date  Pneumococcal vaccine status: Up to date  Covid-19 vaccine status: Completed vaccines  Qualifies for Shingles Vaccine? Yes   Zostavax completed No   Shingrix Completed?: No.    Education has been provided regarding the importance of this vaccine. Patient has been advised to call insurance company to determine out of pocket expense if they have not yet received this vaccine. Advised may also receive vaccine at local pharmacy or Health Dept. Verbalized acceptance and understanding.  Screening Tests Health Maintenance  Topic Date Due  . MAMMOGRAM  06/15/2021  . TETANUS/TDAP  08/01/2021  . COLONOSCOPY (Pts 45-22yr Insurance coverage will need to be confirmed)  08/02/2023  . INFLUENZA VACCINE  Completed  . DEXA SCAN  Completed  . COVID-19 Vaccine  Completed  . Hepatitis C Screening   Completed  . PNA vac Low Risk Adult  Completed    Health Maintenance  There are no preventive care reminders to display for this patient.  Colorectal cancer screening: Type of screening: Colonoscopy. Completed 08/01/2013. Repeat every 10 years  Mammogram status: Completed 06/15/2020. Repeat every year  Bone Density status: Completed 06/12/2019. Results reflect: Bone density results: OSTEOPOROSIS. Repeat every 2 years.  Lung Cancer Screening: (Low Dose CT Chest recommended if Age 73-80years, 30 pack-year currently smoking OR have quit w/in 15 years.) does not qualify.    Additional Screening:  Hepatitis C Screening: does qualify; Completed 03/25/2016  Vision Screening: Recommended annual ophthalmology exams for early detection of glaucoma and other disorders of the eye. Is the patient up to date with their annual eye exam?  Yes  Who is the provider or what is the name of the office in which the patient attends annual eye exams? Dr. PLorie Apley If pt is not established with a provider, would they like to be referred to a provider to establish care? No .   Dental Screening: Recommended annual dental exams for proper oral hygiene  Community Resource Referral / Chronic Care Management: CRR required this visit?  No   CCM required this visit?  No      Plan:     I have personally reviewed and noted the following in the patient's chart:   . Medical and social history . Use of alcohol, tobacco or illicit drugs  . Current medications and supplements . Functional ability and status . Nutritional status . Physical activity . Advanced directives . List of other physicians . Hospitalizations, surgeries, and ER visits in previous 12 months . Vitals . Screenings to include cognitive, depression, and falls . Referrals and appointments  In addition, I have reviewed and discussed with patient certain preventive protocols, quality metrics, and best practice recommendations. A written  personalized care plan for preventive services as well as general preventive health recommendations were provided to patient.   Due to this being a telephonic visit, the after visit summary with patients personalized plan was offered to patient via office or my-chart. Patient preferred to pick up at office at next visit or via mychart.   Andrez Grime, LPN   [id number]

## 2020-08-26 NOTE — Progress Notes (Signed)
PCP notes:  Health Maintenance: No gaps noted   Abnormal Screenings: none   Patient concerns: Has some very high anxiety issues   Nurse concerns: none   Next PCP appt.: 09/02/2019 @ 3 pm

## 2020-08-27 ENCOUNTER — Other Ambulatory Visit: Payer: Self-pay

## 2020-08-27 ENCOUNTER — Other Ambulatory Visit (INDEPENDENT_AMBULATORY_CARE_PROVIDER_SITE_OTHER): Payer: Medicare HMO

## 2020-08-27 DIAGNOSIS — E559 Vitamin D deficiency, unspecified: Secondary | ICD-10-CM | POA: Diagnosis not present

## 2020-08-27 DIAGNOSIS — E785 Hyperlipidemia, unspecified: Secondary | ICD-10-CM | POA: Diagnosis not present

## 2020-08-27 DIAGNOSIS — E538 Deficiency of other specified B group vitamins: Secondary | ICD-10-CM | POA: Diagnosis not present

## 2020-08-27 LAB — LIPID PANEL
Cholesterol: 174 mg/dL (ref 0–200)
HDL: 77.8 mg/dL (ref >39.00)
LDL Cholesterol: 84 mg/dL (ref 0–99)
NonHDL: 95.7
Total CHOL/HDL Ratio: 2
Triglycerides: 58 mg/dL (ref 0.0–149.0)
VLDL: 11.6 mg/dL (ref 0.0–40.0)

## 2020-08-27 LAB — COMPREHENSIVE METABOLIC PANEL
ALT: 12 U/L (ref 0–35)
AST: 7 U/L (ref 0–37)
Albumin: 4.1 g/dL (ref 3.5–5.2)
Alkaline Phosphatase: 92 U/L (ref 39–117)
BUN: 24 mg/dL — ABNORMAL HIGH (ref 6–23)
CO2: 30 mEq/L (ref 19–32)
Calcium: 9.4 mg/dL (ref 8.4–10.5)
Chloride: 104 mEq/L (ref 96–112)
Creatinine, Ser: 0.95 mg/dL (ref 0.40–1.20)
GFR: 59.64 mL/min — ABNORMAL LOW (ref >60.00)
Glucose, Bld: 90 mg/dL (ref 70–99)
Potassium: 4 mEq/L (ref 3.5–5.1)
Sodium: 140 mEq/L (ref 135–145)
Total Bilirubin: 0.3 mg/dL (ref 0.2–1.2)
Total Protein: 7 g/dL (ref 6.0–8.3)

## 2020-08-27 LAB — VITAMIN D 25 HYDROXY (VIT D DEFICIENCY, FRACTURES): VITD: 20.1 ng/mL — ABNORMAL LOW (ref 30.00–100.00)

## 2020-08-27 LAB — VITAMIN B12: Vitamin B-12: 411 pg/mL (ref 211–911)

## 2020-09-01 ENCOUNTER — Encounter: Payer: Self-pay | Admitting: Family Medicine

## 2020-09-01 ENCOUNTER — Other Ambulatory Visit: Payer: Self-pay

## 2020-09-01 ENCOUNTER — Telehealth: Payer: Self-pay | Admitting: Family Medicine

## 2020-09-01 ENCOUNTER — Ambulatory Visit (INDEPENDENT_AMBULATORY_CARE_PROVIDER_SITE_OTHER): Payer: Medicare HMO | Admitting: Family Medicine

## 2020-09-01 VITALS — BP 120/66 | HR 84 | Temp 97.6°F | Ht <= 58 in | Wt 125.0 lb

## 2020-09-01 DIAGNOSIS — F4321 Adjustment disorder with depressed mood: Secondary | ICD-10-CM

## 2020-09-01 DIAGNOSIS — E538 Deficiency of other specified B group vitamins: Secondary | ICD-10-CM | POA: Diagnosis not present

## 2020-09-01 DIAGNOSIS — F5101 Primary insomnia: Secondary | ICD-10-CM

## 2020-09-01 DIAGNOSIS — Z0000 Encounter for general adult medical examination without abnormal findings: Secondary | ICD-10-CM

## 2020-09-01 DIAGNOSIS — M810 Age-related osteoporosis without current pathological fracture: Secondary | ICD-10-CM

## 2020-09-01 DIAGNOSIS — I10 Essential (primary) hypertension: Secondary | ICD-10-CM | POA: Diagnosis not present

## 2020-09-01 DIAGNOSIS — J439 Emphysema, unspecified: Secondary | ICD-10-CM

## 2020-09-01 DIAGNOSIS — E785 Hyperlipidemia, unspecified: Secondary | ICD-10-CM

## 2020-09-01 DIAGNOSIS — Z91012 Allergy to eggs: Secondary | ICD-10-CM

## 2020-09-01 DIAGNOSIS — Z7189 Other specified counseling: Secondary | ICD-10-CM

## 2020-09-01 DIAGNOSIS — E559 Vitamin D deficiency, unspecified: Secondary | ICD-10-CM

## 2020-09-01 DIAGNOSIS — I700 Atherosclerosis of aorta: Secondary | ICD-10-CM

## 2020-09-01 MED ORDER — VITAMIN D3 25 MCG (1000 UT) PO CAPS: 1.0000 | ORAL_CAPSULE | Freq: Every day | ORAL | Status: DC

## 2020-09-01 MED ORDER — VITAMIN D3 1.25 MG (50000 UT) PO TABS: 1.0000 | ORAL_TABLET | ORAL | 3 refills | Status: DC

## 2020-09-01 MED ORDER — BUPROPION HCL ER (SR) 150 MG PO TB12: 150.0000 mg | ORAL_TABLET | Freq: Every day | ORAL | 3 refills | Status: DC

## 2020-09-01 MED ORDER — DULOXETINE HCL 60 MG PO CPEP: 60.0000 mg | ORAL_CAPSULE | Freq: Every day | ORAL | 3 refills | Status: DC

## 2020-09-01 MED ORDER — ALBUTEROL SULFATE HFA 108 (90 BASE) MCG/ACT IN AERS: 2.0000 | INHALATION_SPRAY | Freq: Four times a day (QID) | RESPIRATORY_TRACT | 3 refills | Status: DC | PRN

## 2020-09-01 MED ORDER — LISINOPRIL-HYDROCHLOROTHIAZIDE 10-12.5 MG PO TABS: 1.0000 | ORAL_TABLET | Freq: Every day | ORAL | 3 refills | Status: DC

## 2020-09-01 MED ORDER — CYANOCOBALAMIN 1000 MCG/ML IJ SOLN
1000.0000 ug | Freq: Once | INTRAMUSCULAR | Status: AC
  Administered 2020-09-01: 1000 ug via INTRAMUSCULAR

## 2020-09-01 MED ORDER — ATORVASTATIN CALCIUM 10 MG PO TABS: 10.0000 mg | ORAL_TABLET | Freq: Every day | ORAL | 3 refills | Status: DC

## 2020-09-01 MED ORDER — ATORVASTATIN CALCIUM 10 MG PO TABS: 5.0000 mg | ORAL_TABLET | Freq: Every day | ORAL | 4 refills | Status: DC

## 2020-09-01 NOTE — Assessment & Plan Note (Addendum)
Levels remain low despite regularly taking weekly 50k dose. Will add vitamin D3 1000 IU daily to regimen.

## 2020-09-01 NOTE — Assessment & Plan Note (Signed)
Levels repleted on monthly replacement. Continue - b12 shot today.

## 2020-09-01 NOTE — Telephone Encounter (Signed)
Patient may be interested in transitioning from fosamax to prolia - can we price out for her? Thank you.

## 2020-09-01 NOTE — Assessment & Plan Note (Signed)
Preventative protocols reviewed and updated unless pt declined. Discussed healthy diet and lifestyle.  

## 2020-09-01 NOTE — Assessment & Plan Note (Signed)
Continue sparing ambien use - takes 1/2 tab at a time.

## 2020-09-01 NOTE — Assessment & Plan Note (Signed)
She has been taking fosamax for 7 years. Reviewed latest DEXA, discussed possibly transitioning to prolia as well as need to regularly take q6 months if transitions as well as process of prolia coverage evaluation. Will ask Larene Beach to price out for patient.

## 2020-09-01 NOTE — Assessment & Plan Note (Signed)
Continue statin. 

## 2020-09-01 NOTE — Assessment & Plan Note (Signed)
Chronic, stable on low dose atorvastatin - continue. The 10-year ASCVD risk score Mikey Bussing DC Brooke Bonito., et al., 2013) is: 14.6%   Values used to calculate the score:     Age: 75 years     Sex: Female     Is Non-Hispanic African American: No     Diabetic: No     Tobacco smoker: No     Systolic Blood Pressure: 053 mmHg     Is BP treated: Yes     HDL Cholesterol: 77.8 mg/dL     Total Cholesterol: 174 mg/dL

## 2020-09-01 NOTE — Assessment & Plan Note (Signed)
Discussed shingrix should be ok.

## 2020-09-01 NOTE — Assessment & Plan Note (Signed)
Advanced directives: has at home. HCPOA is daughter Samantha. To bring me copy of living will.  ?

## 2020-09-01 NOTE — Assessment & Plan Note (Signed)
High stress job, mood trouble largely situational over this.  Discussed work options including changing jobs.  Continue cymbalta/wellbutrin

## 2020-09-01 NOTE — Progress Notes (Signed)
Patient ID: Courtney Thomas, female    DOB: February 22, 1946, 75 y.o.   MRN: [id number]  This visit was conducted in person.  BP 120/66 (BP Location: Left Arm, Patient Position: Sitting, Cuff Size: Normal)   Pulse 84   Temp 97.6 F (36.4 C) (Temporal)   Ht 4\' 10"  (1.473 m)   Wt 125 lb (56.7 kg)   SpO2 96%   BMI 26.13 kg/m    CC: CPE  Subjective:   HPI: Courtney Thomas is a 75 y.o. female presenting on 09/01/2020 for Annual Exam (Prt 2. )   Saw health advisor last week for medicare wellness visit. Note reviewed.    No exam data present  Flowsheet Row Clinical Support from 08/26/2020 in [facility] [place]  PHQ-2 Total Score 0      Fall Risk  08/26/2020 04/05/2019 03/28/2018 03/27/2017 03/25/2016  Falls in the past year? 0 0 No No No  Number falls in past yr: 0 - - - -  Comment - - - - -  Injury with Fall? 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 She continues wellbutrin and cymbalta.   Preventative: Colonoscopy - 07/2013 WNL 10 yr f/u ([place]) Well woman - with GYN, not recently, states last pap smear2018WNL. S/p hysterectomy and BSO. Mammogram 06/2020 @ [facility] - abnormal s/p normal dx mamo and [place] of right breast 07/2020. She does breast exams at home. DEXA11/2020 T -2 hip, -3.4 forearm. 10/2017T -3.5--> 3.3spine, -2.0 --> -1.7hip, on fosamax since prior to 03/2014. No jaw or hip pain. May be interested in prolia.  Egg free flu shotyearly. [place] 09/2019 x2, 05/2020 booster Prevnar Dec 05, 2015.Pneumovax2018. Tdap 2011/12/05 with bad reaction Shingrix - discussed  Advanced directives:has at home.HCPOA is daughter [name]. To bring me copy of living will.  Seat belt use  discussed Sunscreen use discussed. No changing moles on skin. Seeing derm.  Non smoker  Alcohol - none  Dentist Q6 mo Eye exam yearly Bowel - no constipation Bladder - no incontinence  Lives alone - husband passed away 12-04-2013. LBD + parkinson disease Started seeing new partner 04-Dec-2016. 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 Interim medical history since our last visit reviewed. Allergies and medications reviewed and updated. Outpatient Medications Prior to Visit  Medication Sig Dispense Refill  . cetirizine (ZYRTEC) 10 MG tablet TAKE ONE TABLET BY MOUTH EVERY DAY 90 tablet 0  . zolpidem (AMBIEN) 10 MG tablet TAKE 1/2 TO 1 TABLET AT BEDTIME AS NEEDED FOR SLEEP 30 tablet 0  . albuterol (VENTOLIN HFA) 108 (90 Base) MCG/ACT inhaler Inhale 2 puffs into the lungs every 6 (six) hours as needed for wheezing or shortness of breath.    Marland Kitchen atorvastatin (LIPITOR) 10 MG tablet TAKE ONE TABLET BY MOUTH EVERY DAY 90 tablet 0  . buPROPion (WELLBUTRIN SR) 150 MG 12 hr tablet TAKE 1 TABLET BY MOUTH DAILY 90 tablet 0  . Cholecalciferol (VITAMIN D3) 1.25 MG (50000 UT) TABS Take 1 tablet  by mouth once a week. 12 tablet 3  . DULoxetine (CYMBALTA) 60 MG capsule TAKE 1 CAPSULE BY MOUTH ONCE DAILY 90 capsule 0  . lisinopril-hydrochlorothiazide (ZESTORETIC) 10-12.5 MG tablet TAKE 1 TABLET BY MOUTH DAILY 90 tablet 0  . meclizine (ANTIVERT) 25 MG tablet Take 1 tablet (25 mg total) by mouth 3 (three) times daily as needed for dizziness. 20 tablet 0  . ondansetron (ZOFRAN ODT) 4 MG disintegrating tablet Take 1 tablet (4 mg total) by mouth every 8 (eight) hours as needed. 20 tablet 0   No facility-administered medications prior to visit.     Per HPI unless specifically indicated in ROS section below Review of  Systems  Constitutional: Negative for activity change, appetite change, chills, fatigue, fever and unexpected weight change.  HENT: Negative for hearing loss.   Eyes: Negative for visual disturbance.  Respiratory: Negative for cough, chest tightness, shortness of breath and wheezing.   Cardiovascular: Negative for chest pain, palpitations and leg swelling.  Gastrointestinal: Negative for abdominal distention, abdominal pain, blood in stool, constipation, diarrhea, nausea and vomiting.  Genitourinary: Negative for difficulty urinating and hematuria.  Musculoskeletal: Negative for arthralgias, myalgias and neck pain.  Skin: Negative for rash.  Neurological: Negative for dizziness, seizures, syncope and headaches.  Hematological: Negative for adenopathy. Does not bruise/bleed easily.  Psychiatric/Behavioral: Negative for dysphoric mood. The patient is nervous/anxious (work related).    Objective:  BP 120/66 (BP Location: Left Arm, Patient Position: Sitting, Cuff Size: Normal)   Pulse 84   Temp 97.6 F (36.4 C) (Temporal)   Ht 4\' 10"  (1.473 m)   Wt 125 lb (56.7 kg)   SpO2 96%   BMI 26.13 kg/m   Wt Readings from Last 3 Encounters:  09/01/20 125 lb (56.7 kg)  11/27/19 281 lb 4.9 oz (127.6 kg)  04/16/19 122 lb (55.3 kg)      Physical Exam Vitals and nursing note reviewed.  Constitutional:      General: She is not in acute distress.    Appearance: Normal appearance. She is well-developed and well-nourished. She is not ill-appearing.  HENT:     Head: Normocephalic and atraumatic.     Right Ear: Hearing, tympanic membrane, ear canal and external ear normal.     Left Ear: Hearing, tympanic membrane, ear canal and external ear normal.     Mouth/Throat:     Mouth: Oropharynx is clear and moist and mucous membranes are normal.     Pharynx: No posterior oropharyngeal edema.  Eyes:     General: No scleral icterus.    Extraocular Movements: Extraocular movements intact and EOM normal.      Conjunctiva/sclera: Conjunctivae normal.     Pupils: Pupils are equal, round, and reactive to light.  Neck:     Thyroid: No thyroid mass or thyromegaly.     Vascular: No carotid bruit.  Cardiovascular:     Rate and Rhythm: Normal rate and regular rhythm.     Pulses: Normal pulses and intact distal pulses.          Radial pulses are 2+ on the right side and 2+ on the left side.     Heart sounds: Normal heart sounds. No murmur heard.   Pulmonary:     Effort: Pulmonary effort is normal. No respiratory distress.     Breath sounds: Normal breath sounds. No wheezing, rhonchi or rales.  Abdominal:     General: Abdomen is flat. Bowel sounds are normal. There is no distension.  Palpations: Abdomen is soft. There is no mass.     Tenderness: There is no abdominal tenderness. There is no guarding or rebound.     Hernia: No hernia is present.  Musculoskeletal:        General: No edema. Normal range of motion.     Cervical back: Normal range of motion and neck supple.     Right lower leg: No edema.     Left lower leg: No edema.  Lymphadenopathy:     Cervical: No cervical adenopathy.  Skin:    General: Skin is warm and dry.     Findings: No rash.  Neurological:     General: No focal deficit present.     Mental Status: She is alert and oriented to person, place, and time.     Comments: CN grossly intact, station and gait intact  Psychiatric:        Mood and Affect: Mood and affect and mood normal.        Behavior: Behavior normal.        Thought Content: Thought content normal.        Judgment: Judgment normal.       Results for orders placed or performed in visit on 08/27/20  Vitamin B12  Result Value Ref Range   Vitamin B-12 411 211 - 911 pg/mL  VITAMIN D 25 Hydroxy (Vit-D Deficiency, Fractures)  Result Value Ref Range   VITD 20.10 (L) 30.00 - 100.00 ng/mL  Comprehensive metabolic panel  Result Value Ref Range   Sodium 140 135 - 145 mEq/L   Potassium 4.0 3.5 - 5.1 mEq/L    Chloride 104 96 - 112 mEq/L   CO2 30 19 - 32 mEq/L   Glucose, Bld 90 70 - 99 mg/dL   BUN 24 (H) 6 - 23 mg/dL   Creatinine, Ser 0.95 0.40 - 1.20 mg/dL   Total Bilirubin 0.3 0.2 - 1.2 mg/dL   Alkaline Phosphatase 92 39 - 117 U/L   AST 7 0 - 37 U/L   ALT 12 0 - 35 U/L   Total Protein 7.0 6.0 - 8.3 g/dL   Albumin 4.1 3.5 - 5.2 g/dL   GFR 59.64 (L) >60.00 mL/min   Calcium 9.4 8.4 - 10.5 mg/dL  Lipid panel  Result Value Ref Range   Cholesterol 174 0 - 200 mg/dL   Triglycerides 58.0 0.0 - 149.0 mg/dL   HDL 77.80 >39.00 mg/dL   VLDL 11.6 0.0 - 40.0 mg/dL   LDL Cholesterol 84 0 - 99 mg/dL   Total CHOL/HDL Ratio 2    NonHDL 95.70    Assessment & Plan:  This visit occurred during the SARS-CoV-2 public health emergency.  Safety protocols were in place, including screening questions prior to the visit, additional usage of staff PPE, and extensive cleaning of exam room while observing appropriate contact time as indicated for disinfecting solutions.   Problem List Items Addressed This Visit    Vitamin D deficiency    Levels remain low despite regularly taking weekly 50k dose. Will add vitamin D3 1000 IU daily to regimen.       Vitamin B12 deficiency    Levels repleted on monthly replacement. Continue - b12 shot today.       Thoracic aorta atherosclerosis (HCC)    Continue statin.       Relevant Medications   atorvastatin (LIPITOR) 10 MG tablet   lisinopril-hydrochlorothiazide (ZESTORETIC) 10-12.5 MG tablet   Osteoporosis    She has been taking fosamax  for 7 years. Reviewed latest DEXA, discussed possibly transitioning to prolia as well as need to regularly take q6 months if transitions as well as process of prolia coverage evaluation. Will ask Larene Beach to price out for patient.       Relevant Medications   Cholecalciferol (VITAMIN D3) 25 MCG (1000 UT) CAPS   Cholecalciferol (VITAMIN D3) 1.25 MG (50000 UT) TABS   Other Relevant Orders   DG Bone Density   Insomnia    Continue  sparing ambien use - takes 1/2 tab at a time.       Hypertension    Chronic, stable on current regimen. Continue lisinopril/hctz.      Relevant Medications   atorvastatin (LIPITOR) 10 MG tablet   lisinopril-hydrochlorothiazide (ZESTORETIC) 10-12.5 MG tablet   Hyperlipidemia    Chronic, stable on low dose atorvastatin - continue. The 10-year ASCVD risk score Mikey Bussing DC Brooke Bonito., et al., 2013) is: 14.6%   Values used to calculate the score:     Age: 21 years     Sex: Female     Is Non-Hispanic African American: No     Diabetic: No     Tobacco smoker: No     Systolic Blood Pressure: 123456 mmHg     Is BP treated: Yes     HDL Cholesterol: 77.8 mg/dL     Total Cholesterol: 174 mg/dL       Relevant Medications   atorvastatin (LIPITOR) 10 MG tablet   lisinopril-hydrochlorothiazide (ZESTORETIC) 10-12.5 MG tablet   History of allergy to eggs    Discussed shingrix should be ok.       Health care maintenance - Primary    Preventative protocols reviewed and updated unless pt declined. Discussed healthy diet and lifestyle.       Emphysema of lung (HCC)    Continue PRN albuterol use. Discussed if frequent need noted to consider daily ICS.       Relevant Medications   albuterol (VENTOLIN HFA) 108 (90 Base) MCG/ACT inhaler   Advanced care planning/counseling discussion    Advanced directives:has at home.HCPOA is daughter [name]. To bring me copy of living will.       Adjustment disorder with depressed mood    High stress job, mood trouble largely situational over this.  Discussed work options including changing jobs.  Continue cymbalta/wellbutrin          Meds ordered this encounter  Medications  . cyanocobalamin ((VITAMIN B-12)) injection 1,000 mcg  . albuterol (VENTOLIN HFA) 108 (90 Base) MCG/ACT inhaler    Sig: Inhale 2 puffs into the lungs every 6 (six) hours as needed for wheezing or shortness of breath.    Dispense:  16 g    Refill:  3  . Cholecalciferol (VITAMIN D3) 25  MCG (1000 UT) CAPS    Sig: Take 1 capsule (1,000 Units total) by mouth daily.    Dispense:  30 capsule  . DISCONTD: atorvastatin (LIPITOR) 10 MG tablet    Sig: Take 0.5 tablets (5 mg total) by mouth daily.    Dispense:  45 tablet    Refill:  4  . atorvastatin (LIPITOR) 10 MG tablet    Sig: Take 1 tablet (10 mg total) by mouth daily.    Dispense:  90 tablet    Refill:  3  . lisinopril-hydrochlorothiazide (ZESTORETIC) 10-12.5 MG tablet    Sig: Take 1 tablet by mouth daily.    Dispense:  90 tablet    Refill:  3  . DULoxetine (CYMBALTA) 60  MG capsule    Sig: Take 1 capsule (60 mg total) by mouth daily.    Dispense:  90 capsule    Refill:  3  . buPROPion (WELLBUTRIN SR) 150 MG 12 hr tablet    Sig: Take 1 tablet (150 mg total) by mouth daily.    Dispense:  90 tablet    Refill:  3  . Cholecalciferol (VITAMIN D3) 1.25 MG (50000 UT) TABS    Sig: Take 1 tablet by mouth once a week.    Dispense:  12 tablet    Refill:  3   Orders Placed This Encounter  Procedures  . DG Bone Density    Standing Status:   Future    Standing Expiration Date:   09/01/2021    Scheduling Instructions:     To schedule with mammo 07/2021 when due    Order Specific Question:   Reason for Exam (SYMPTOM  OR DIAGNOSIS REQUIRED)    Answer:   osteoporosis    Order Specific Question:   Preferred imaging location?    Answer:   [facility]    Patient instructions: B12 shot today.  Continue weekly vitamin D,add daily 1000 units vitamin D as well.  We will price out prolia for you.  I hope work situation improves - consider stopping this job if stress is not improving.  If interested, check with pharmacy about new 2 shot shingles series (shingrix).  Bring [place] copy of your living will.  You are doing well today Return as needed or in 1 year for next physical.   Follow up plan: Return in about 1 year (around 09/01/2021) for annual exam, prior fasting for blood work, medicare wellness visit.  Ria Bush,  MD

## 2020-09-01 NOTE — Patient Instructions (Addendum)
B12 shot today.  Continue weekly vitamin D,add daily 1000 units vitamin D as well.  We will price out prolia for you.  I hope work situation improves - consider stopping this job if stress is not improving.  If interested, check with pharmacy about new 2 shot shingles series (shingrix).  Bring [place] copy of your living will.  You are doing well today Return as needed or in 1 year for next physical.   Health Maintenance After Age 73 After age 65, you are at a higher risk for certain long-term diseases and infections as well as injuries from falls. Falls are a major cause of broken bones and head injuries in people who are older than age 73. Getting regular preventive care can help to keep you healthy and well. Preventive care includes getting regular testing and making lifestyle changes as recommended by your health care provider. Talk with your health care provider about:  Which screenings and tests you should have. A screening is a test that checks for a disease when you have no symptoms.  A diet and exercise plan that is right for you. What should I know about screenings and tests to prevent falls? Screening and testing are the best ways to find a health problem early. Early diagnosis and treatment give you the best chance of managing medical conditions that are common after age 43. Certain conditions and lifestyle choices may make you more likely to have a fall. Your health care provider may recommend:  Regular vision checks. Poor vision and conditions such as cataracts can make you more likely to have a fall. If you wear glasses, make sure to get your prescription updated if your vision changes.  Medicine review. Work with your health care provider to regularly review all of the medicines you are taking, including over-the-counter medicines. Ask your health care provider about any side effects that may make you more likely to have a fall. Tell your health care provider if any medicines that you  take make you feel dizzy or sleepy.  Osteoporosis screening. Osteoporosis is a condition that causes the bones to get weaker. This can make the bones weak and cause them to break more easily.  Blood pressure screening. Blood pressure changes and medicines to control blood pressure can make you feel dizzy.  Strength and balance checks. Your health care provider may recommend certain tests to check your strength and balance while standing, walking, or changing positions.  Foot health exam. Foot pain and numbness, as well as not wearing proper footwear, can make you more likely to have a fall.  Depression screening. You may be more likely to have a fall if you have a fear of falling, feel emotionally low, or feel unable to do activities that you used to do.  Alcohol use screening. Using too much alcohol can affect your balance and may make you more likely to have a fall. What actions can I take to lower my risk of falls? General instructions  Talk with your health care provider about your risks for falling. Tell your health care provider if: ? You fall. Be sure to tell your health care provider about all falls, even ones that seem minor. ? You feel dizzy, sleepy, or off-balance.  Take over-the-counter and prescription medicines only as told by your health care provider. These include any supplements.  Eat a healthy diet and maintain a healthy weight. A healthy diet includes low-fat dairy products, low-fat (lean) meats, and fiber from whole grains, beans,  and lots of fruits and vegetables. Home safety  Remove any tripping hazards, such as rugs, cords, and clutter.  Install safety equipment such as grab bars in bathrooms and safety rails on stairs.  Keep rooms and walkways well-lit. Activity  Follow a regular exercise program to stay fit. This will help you maintain your balance. Ask your health care provider what types of exercise are appropriate for you.  If you need a cane or walker,  use it as recommended by your health care provider.  Wear supportive shoes that have nonskid soles.   Lifestyle  Do not drink alcohol if your health care provider tells you not to drink.  If you drink alcohol, limit how much you have: ? 0-1 drink a day for women. ? 0-2 drinks a day for men.  Be aware of how much alcohol is in your drink. In the [place] one drink equals one typical bottle of beer (12 oz), one-half glass of wine (5 oz), or one shot of hard liquor (1 oz).  Do not use any products that contain nicotine or tobacco, such as cigarettes and e-cigarettes. If you need help quitting, ask your health care provider. Summary  Having a healthy lifestyle and getting preventive care can help to protect your health and wellness after age 8.  Screening and testing are the best way to find a health problem early and help you avoid having a fall. Early diagnosis and treatment give you the best chance for managing medical conditions that are more common for people who are older than age 29.  Falls are a major cause of broken bones and head injuries in people who are older than age 19. Take precautions to prevent a fall at home.  Work with your health care provider to learn what changes you can make to improve your health and wellness and to prevent falls. This information is not intended to replace advice given to you by your health care provider. Make sure you discuss any questions you have with your health care provider. Document Revised: 11/08/2018 Document Reviewed: 05/31/2017 Elsevier Patient Education  2021 [employer].

## 2020-09-01 NOTE — Assessment & Plan Note (Signed)
Continue PRN albuterol use. Discussed if frequent need noted to consider daily ICS.

## 2020-09-01 NOTE — Assessment & Plan Note (Signed)
Chronic, stable on current regimen. Continue lisinopril/hctz.

## 2020-09-02 NOTE — Telephone Encounter (Signed)
Benefits submitted on [employer].  

## 2020-09-18 ENCOUNTER — Other Ambulatory Visit: Payer: Self-pay | Admitting: Family Medicine

## 2020-09-18 NOTE — Addendum Note (Signed)
Addended by: Ronna Polio on: 09/18/2020 03:31 PM   Modules accepted: Orders

## 2020-09-18 NOTE — Telephone Encounter (Signed)
Called and informed patient of OOP cost. Patient verbalized understanding. Labs scheduled for 09/22/2020 and NV scheduled for 10/06/2020.

## 2020-09-21 NOTE — Telephone Encounter (Signed)
Noted. Thanks.

## 2020-09-21 NOTE — Telephone Encounter (Signed)
Noted.  Pt is new start on prolia but will need to stop fosamax one week prior to receiving the prolia inj.

## 2020-09-21 NOTE — Telephone Encounter (Signed)
Cost pt agreed to will be $250 for prolia and $25 for administration fee.

## 2020-09-22 ENCOUNTER — Other Ambulatory Visit: Payer: Self-pay

## 2020-09-22 ENCOUNTER — Other Ambulatory Visit (INDEPENDENT_AMBULATORY_CARE_PROVIDER_SITE_OTHER): Payer: Medicare HMO

## 2020-09-22 DIAGNOSIS — M810 Age-related osteoporosis without current pathological fracture: Secondary | ICD-10-CM

## 2020-09-22 LAB — BASIC METABOLIC PANEL
BUN: 20 mg/dL (ref 6–23)
CO2: 31 mEq/L (ref 19–32)
Calcium: 9.2 mg/dL (ref 8.4–10.5)
Chloride: 103 mEq/L (ref 96–112)
Creatinine, Ser: 0.87 mg/dL (ref 0.40–1.20)
GFR: 66.25 mL/min (ref >60.00)
Glucose, Bld: 92 mg/dL (ref 70–99)
Potassium: 3.7 mEq/L (ref 3.5–5.1)
Sodium: 141 mEq/L (ref 135–145)

## 2020-09-23 NOTE — Telephone Encounter (Signed)
Labs normal, Ca normal and CrCl is 51.55 mL/min.  Contacted pt and advised to stop fosamax at least one weak in advance of the prolia injection. Pt verbalized understanding

## 2020-10-07 ENCOUNTER — Other Ambulatory Visit: Payer: Self-pay

## 2020-10-07 ENCOUNTER — Ambulatory Visit (INDEPENDENT_AMBULATORY_CARE_PROVIDER_SITE_OTHER): Payer: Medicare HMO

## 2020-10-07 DIAGNOSIS — M810 Age-related osteoporosis without current pathological fracture: Secondary | ICD-10-CM | POA: Diagnosis not present

## 2020-10-07 DIAGNOSIS — E538 Deficiency of other specified B group vitamins: Secondary | ICD-10-CM | POA: Diagnosis not present

## 2020-10-07 MED ORDER — CYANOCOBALAMIN 1000 MCG/ML IJ SOLN
1000.0000 ug | Freq: Once | INTRAMUSCULAR | Status: AC
  Administered 2020-10-07: 1000 ug via INTRAMUSCULAR

## 2020-10-07 MED ORDER — DENOSUMAB 60 MG/ML ~~LOC~~ SOSY
60.0000 mg | PREFILLED_SYRINGE | Freq: Once | SUBCUTANEOUS | Status: AC
  Administered 2020-10-07: 60 mg via SUBCUTANEOUS

## 2020-10-07 NOTE — Progress Notes (Signed)
Patient presented for 74-month Prolia injection SQ to Right arm given by Sherrilee Gilles, CMA. Patient tolerated injection well.  Patient presented for B 12 injection given by Sherrilee Gilles, CMA to left deltoid , patient voiced no concerns nor showed any signs of distress during injection.

## 2020-10-19 ENCOUNTER — Other Ambulatory Visit: Payer: Self-pay | Admitting: Family Medicine

## 2020-10-19 NOTE — Telephone Encounter (Signed)
ERx 

## 2020-10-19 NOTE — Telephone Encounter (Signed)
Refill request Zolpidem Last refill 08/19/20 #30 Last office visit 09/01/20 Upcoming appointment none scheduled

## 2020-11-19 ENCOUNTER — Other Ambulatory Visit: Payer: Self-pay

## 2020-11-19 ENCOUNTER — Ambulatory Visit: Payer: Medicare HMO

## 2020-11-23 ENCOUNTER — Telehealth: Payer: Self-pay

## 2020-11-23 NOTE — Telephone Encounter (Signed)
Patient called and stated that she had lost her prescription of Lisinopril-Hydrochlorothiazide 10-12.5 mg that she picked up from the pharmacy on 4/13. Advised patient to call pharmacy and let them know. Patient verbalized understanding.

## 2020-12-01 ENCOUNTER — Ambulatory Visit (INDEPENDENT_AMBULATORY_CARE_PROVIDER_SITE_OTHER): Payer: Medicare HMO

## 2020-12-01 ENCOUNTER — Other Ambulatory Visit: Payer: Self-pay

## 2020-12-01 DIAGNOSIS — E538 Deficiency of other specified B group vitamins: Secondary | ICD-10-CM

## 2020-12-01 MED ORDER — CYANOCOBALAMIN 1000 MCG/ML IJ SOLN
1000.0000 ug | Freq: Once | INTRAMUSCULAR | Status: AC
  Administered 2020-12-01: 1000 ug via INTRAMUSCULAR

## 2020-12-01 NOTE — Progress Notes (Signed)
Per orders of Dr. Danise Mina, injection of B12 in R deltoid given by Randall An. Patient tolerated injection well.

## 2020-12-09 ENCOUNTER — Encounter: Payer: Self-pay | Admitting: Family Medicine

## 2020-12-09 ENCOUNTER — Other Ambulatory Visit: Payer: Self-pay

## 2020-12-09 ENCOUNTER — Ambulatory Visit (INDEPENDENT_AMBULATORY_CARE_PROVIDER_SITE_OTHER): Payer: Medicare HMO | Admitting: Family Medicine

## 2020-12-09 DIAGNOSIS — S30861A Insect bite (nonvenomous) of abdominal wall, initial encounter: Secondary | ICD-10-CM | POA: Diagnosis not present

## 2020-12-09 DIAGNOSIS — W57XXXA Bitten or stung by nonvenomous insect and other nonvenomous arthropods, initial encounter: Secondary | ICD-10-CM | POA: Diagnosis not present

## 2020-12-09 NOTE — Assessment & Plan Note (Signed)
Unsure duration of attachment.  Now with some concerning symptoms including abd cramping, headache, temperature elevation but not true fever. Will Rx doxycycline 10d course with photosensitivity precautions.  Update if not improving with treatment. Pt agrees with plan.

## 2020-12-09 NOTE — Patient Instructions (Addendum)
Given recent tick bite, unsure duration of attachment, and recent symptoms, likely prudent to go ahead with treatment. Take doxycycline 100mg  twice daily for 10 days. Take with food, avoid too much direct sunlight while on antibiotic. Let us know if not improving with treatment.   Tick Bite Information, Adult Ticks are insects that draw blood for food. Most ticks live in shrubs and grassy and wooded areas. They climb onto people and animals that brush against the leaves and grasses that they rest on. Then they bite, attaching themselves to the skin. Most ticks are harmless, but some ticks may carry germs that can spread to a person through a bite and cause a disease. To reduce your risk of getting a disease from a tick bite, make sure you:  Take steps to prevent tick bites.  Check for ticks after being outdoors where ticks live.  Watch for symptoms of disease if a tick attached to you or if you suspect a tick bite. How can I prevent tick bites? Take these steps to help prevent tick bites when you go outdoors in an area where ticks live: Use insect repellent  Use insect repellent that has DEET (20% or higher), picaridin, or IR3535 in it. Follow the instructions on the label. Use these products on: ? Bare skin. ? The top of your boots. ? Your pant legs. ? Your sleeve cuffs.  For insect repellent that contains permethrin, follow the instructions on the label. Use these products on: ? Clothing. ? Boots. ? Outdoor gear. ? Tents. When you are outside  Wear protective clothing. Long sleeves and long pants offer the best protection from ticks.  Wear light-colored clothing so you can see ticks more easily.  Tuck your pant legs into your socks.  If you go walking on a trail, stay in the middle of the trail so your skin, hair, and clothing do not touch the bushes.  Avoid walking through areas with long grass.  Check for ticks on your clothing, hair, and skin often while you are outside, and  check again before you go inside. Make sure to check the scalp, neck, armpits, waist, groin, and joint areas. These are the spots where ticks attach themselves most often. When you go indoors  Check your clothing for ticks. Tumble dry clothes in a dryer on high heat for at least 10 minutes. If clothes are damp, additional time may be needed. If clothes require washing, use hot water.  Examine gear and pets.  Shower soon after being outdoors.  Check your body for ticks. Conduct a full body check using a mirror. What is the proper way to remove a tick? If you find a tick on your body, remove it as soon as possible. Removing a tick sooner can prevent germs from passing to your body. Do not remove the tick with your bare fingers. To remove a tick that is crawling on your skin but has not bitten, use either of these methods:  Go outdoors and brush the tick off.  Remove the tick with tape or a lint roller. To remove a tick that is attached to your skin: 1. Wash your hands. If you have latex gloves, put them on. 2. Use fine-tipped tweezers, curved forceps, or a tick-removal tool to gently grasp the tick as close to your skin and the tick's head as possible. 3. Gently pull with a steady, upward, even pressure until the tick lets go. 4. When removing the tick: ? Take care to keep the  tick's head attached to its body. ? Do not twist or jerk the tick. This can make the tick's head or mouth parts break off and remain in the skin. ? Do not squeeze or crush the tick's body. This could force disease-carrying fluids from the tick into your body. Do not try to remove a tick with heat, alcohol, petroleum jelly, or fingernail polish. Using these methods can cause the tick to salivate and regurgitate into your bloodstream, increasing your risk of getting a disease.   What should I do after removing a tick?  Dispose of the tick. Do not crush a tick with your fingers.  Clean the bite area and your hands with  soap and water, rubbing alcohol, or an iodine scrub.  If an antiseptic cream or ointment is available, apply a small amount to the bite site.  Wash and disinfect any instruments that you used to remove the tick. How should I dispose of a tick? To dispose of a live tick, use one of these methods:  Place it in rubbing alcohol.  Place it in a sealed bag or container.  Wrap it tightly in tape.  Flush it down the toilet. Contact a health care provider if:  You have symptoms of a disease after a tick bite. Symptoms of a tick-borne disease can occur from moments after the tick bites to 30 days after a tick is removed. Symptoms include: ? Fever or chills. ? Any of these signs in the bite area:  A red rash that makes a circle (bull's-eye rash) in the bite area.  Redness and swelling. ? Headache. ? Muscle, joint, or bone pain. ? Abnormal tiredness. ? Numbness in your legs or difficulty walking or moving your legs. ? Tender, swollen lymph glands.  A part of a tick breaks off and gets stuck in your skin. Get help right away if:  You are not able to remove a tick.  You experience muscle weakness or paralysis.  Your symptoms get worse or you experience new symptoms.  You find an engorged tick on your skin and you are in an area where disease from ticks is a high risk. Summary  Ticks may carry germs that can spread to a person through a bite and cause a disease.  Wear protective clothing and use insect repellent to prevent tick bites. Follow the instructions on the label.  If you find a tick on your body, remove it as soon as possible. If the tick is attached, do not try to remove with heat, alcohol, petroleum jelly, or fingernail polish.  Remove the attached tick using fine-tipped tweezers, curved forceps, or a tick-removal tool. Gently pull with steady, upward, even pressure until the tick lets go. Do not twist or jerk the tick. Do not squeeze or crush the tick's body.  If you  have symptoms of a disease after being bitten by a tick, contact a health care provider. This information is not intended to replace advice given to you by your health care provider. Make sure you discuss any questions you have with your health care provider. Document Revised: 07/15/2019 Document Reviewed: 07/15/2019 Elsevier Patient Education  2021 [employer].

## 2020-12-09 NOTE — Progress Notes (Signed)
Patient ID: Courtney Thomas, female    DOB: 1946-01-05, 75 y.o.   MRN: [id number]  This visit was conducted in person.  BP 106/68   Pulse 91   Temp 98 F (36.7 C) (Temporal)   Ht 4\' 10"  (1.473 m)   Wt 126 lb (57.2 kg)   SpO2 97%   BMI 26.33 kg/m    CC: tick bite  Subjective:   HPI: Courtney Thomas is a 75 y.o. female presenting on 12/09/2020 for Insect Bite (C/o tick bite on abd LLQ.  Noticed 12/06/20.  Tick has been removed.  Area has some redness and stings. )   Noticed tiny black tick attached to skin LLQ abdomen on Sunday 12/06/2020. Friend removed it - thinks fully removed. Red lump that stings persists. Did clean area with alcohol. Unsure how long it was attached or where she could have gotten it.   She had increased temperature last night 99.1, as well as malaise and fatigue. Woke up with headache yesterday, noticing some abdominal cramping. Nausea yesterday.  No chills, new joint pains, new rash, diarrhea.  No UTI symptoms.      Relevant past medical, surgical, family and social history reviewed and updated as indicated. Interim medical history since our last visit reviewed. Allergies and medications reviewed and updated. Outpatient Medications Prior to Visit  Medication Sig Dispense Refill  . albuterol (VENTOLIN HFA) 108 (90 Base) MCG/ACT inhaler Inhale 2 puffs into the lungs every 6 (six) hours as needed for wheezing or shortness of breath. 16 g 3  . atorvastatin (LIPITOR) 10 MG tablet Take 1 tablet (10 mg total) by mouth daily. 90 tablet 3  . buPROPion (WELLBUTRIN SR) 150 MG 12 hr tablet Take 1 tablet (150 mg total) by mouth daily. 90 tablet 3  . cetirizine (ZYRTEC) 10 MG tablet TAKE ONE TABLET BY MOUTH EVERY DAY 90 tablet 3  . Cholecalciferol (VITAMIN D3) 1.25 MG (50000 UT) TABS Take 1 tablet by mouth once a week. 12 tablet 3  . Cholecalciferol (VITAMIN D3) 25 MCG (1000 UT) CAPS Take 1 capsule (1,000 Units total) by mouth daily. 30 capsule   . DULoxetine (CYMBALTA) 60 MG  capsule Take 1 capsule (60 mg total) by mouth daily. 90 capsule 3  . lisinopril-hydrochlorothiazide (ZESTORETIC) 10-12.5 MG tablet Take 1 tablet by mouth daily. 90 tablet 3  . zolpidem (AMBIEN) 10 MG tablet TAKE 1/2 TO 1 TABLET AT BEDTIME AS NEEDED FOR SLEEP 30 tablet 0   No facility-administered medications prior to visit.     Per HPI unless specifically indicated in ROS section below Review of Systems Objective:  BP 106/68   Pulse 91   Temp 98 F (36.7 C) (Temporal)   Ht 4\' 10"  (1.473 m)   Wt 126 lb (57.2 kg)   SpO2 97%   BMI 26.33 kg/m   Wt Readings from Last 3 Encounters:  12/09/20 126 lb (57.2 kg)  09/01/20 125 lb (56.7 kg)  11/27/19 281 lb 4.9 oz (127.6 kg)      Physical Exam Vitals and nursing note reviewed.  Constitutional:      Appearance: Normal appearance. She is not ill-appearing.  Cardiovascular:     Rate and Rhythm: Normal rate and regular rhythm.     Pulses: Normal pulses.     Heart sounds: Normal heart sounds. No murmur heard.   Pulmonary:     Effort: Pulmonary effort is normal. No respiratory distress.     Breath sounds: No wheezing, rhonchi or  rales.     Comments: Crackles RLL that do partially clear with deep cough/inspiration Musculoskeletal:     Right lower leg: No edema.     Left lower leg: No edema.  Skin:    General: Skin is warm and dry.     Findings: Erythema and rash present.          Comments: Small erythematous itchy nodule to L groin at site of prior tick bite, no residual tick parts noted  Neurological:     Mental Status: She is alert.  Psychiatric:        Mood and Affect: Mood normal.        Behavior: Behavior normal.       Results for orders placed or performed in visit on 99/37/16  Basic Metabolic Panel  Result Value Ref Range   Sodium 141 135 - 145 mEq/L   Potassium 3.7 3.5 - 5.1 mEq/L   Chloride 103 96 - 112 mEq/L   CO2 31 19 - 32 mEq/L   Glucose, Bld 92 70 - 99 mg/dL   BUN 20 6 - 23 mg/dL   Creatinine, Ser 0.87 0.40  - 1.20 mg/dL   GFR 66.25 >60.00 mL/min   Calcium 9.2 8.4 - 10.5 mg/dL   Assessment & Plan:  This visit occurred during the SARS-CoV-2 public health emergency.  Safety protocols were in place, including screening questions prior to the visit, additional usage of staff PPE, and extensive cleaning of exam room while observing appropriate contact time as indicated for disinfecting solutions.   Problem List Items Addressed This Visit    Tick bite of groin    Unsure duration of attachment.  Now with some concerning symptoms including abd cramping, headache, temperature elevation but not true fever. Will Rx doxycycline 10d course with photosensitivity precautions.  Update if not improving with treatment. Pt agrees with plan.           No orders of the defined types were placed in this encounter.  No orders of the defined types were placed in this encounter.   Patient instructions: Given recent tick bite, unsure duration of attachment, and recent symptoms, likely prudent to go ahead with treatment. Take doxycycline 100mg  twice daily for 10 days. Take with food, avoid too much direct sunlight while on antibiotic. Let us know if not improving with treatment.   Follow up plan: Return if symptoms worsen or fail to improve.  Ria Bush, MD

## 2020-12-10 MED ORDER — DOXYCYCLINE HYCLATE 100 MG PO TABS: 100.0000 mg | ORAL_TABLET | Freq: Two times a day (BID) | ORAL | 0 refills | Status: DC

## 2020-12-10 NOTE — Addendum Note (Signed)
Addended by: Lurlean Nanny on: 12/10/2020 09:56 AM   Modules accepted: Orders

## 2020-12-16 ENCOUNTER — Other Ambulatory Visit: Payer: Self-pay | Admitting: Family Medicine

## 2020-12-16 NOTE — Telephone Encounter (Signed)
Name of Medication: Ambien Name of Pharmacy: [facility] or Written Date and Quantity: 10/20/20, #30 Last Office Visit and Type: 12/09/20, tick bite Next Office Visit and Type: none Last Controlled Substance Agreement Date: none Last UDS: none

## 2020-12-18 NOTE — Telephone Encounter (Signed)
ERx 

## 2020-12-24 DIAGNOSIS — D2271 Melanocytic nevi of right lower limb, including hip: Secondary | ICD-10-CM | POA: Diagnosis not present

## 2020-12-24 DIAGNOSIS — D2262 Melanocytic nevi of left upper limb, including shoulder: Secondary | ICD-10-CM | POA: Diagnosis not present

## 2020-12-24 DIAGNOSIS — X32XXXA Exposure to sunlight, initial encounter: Secondary | ICD-10-CM | POA: Diagnosis not present

## 2020-12-24 DIAGNOSIS — Z85828 Personal history of other malignant neoplasm of skin: Secondary | ICD-10-CM | POA: Diagnosis not present

## 2020-12-24 DIAGNOSIS — D225 Melanocytic nevi of trunk: Secondary | ICD-10-CM | POA: Diagnosis not present

## 2020-12-24 DIAGNOSIS — L570 Actinic keratosis: Secondary | ICD-10-CM | POA: Diagnosis not present

## 2020-12-24 DIAGNOSIS — D2272 Melanocytic nevi of left lower limb, including hip: Secondary | ICD-10-CM | POA: Diagnosis not present

## 2021-01-06 ENCOUNTER — Ambulatory Visit (INDEPENDENT_AMBULATORY_CARE_PROVIDER_SITE_OTHER): Payer: Medicare HMO

## 2021-01-06 ENCOUNTER — Other Ambulatory Visit: Payer: Self-pay

## 2021-01-06 DIAGNOSIS — E538 Deficiency of other specified B group vitamins: Secondary | ICD-10-CM | POA: Diagnosis not present

## 2021-01-06 MED ORDER — CYANOCOBALAMIN 1000 MCG/ML IJ SOLN
1000.0000 ug | Freq: Once | INTRAMUSCULAR | Status: AC
  Administered 2021-01-06: 1000 ug via INTRAMUSCULAR

## 2021-01-06 NOTE — Progress Notes (Signed)
Per orders of Dr. Danise Mina, monthly injection of B12 1000 mcg/ml given by Pilar Grammes, CMA in Left Deltoid. Patient tolerated injection well.

## 2021-01-22 ENCOUNTER — Telehealth: Payer: Self-pay

## 2021-01-22 MED ORDER — PREDNISONE 20 MG PO TABS: ORAL_TABLET | ORAL | 0 refills | Status: DC

## 2021-01-22 NOTE — Telephone Encounter (Signed)
Spoke with pt relaying Dr. G's message.  Pt verbalizes understanding and expresses her thanks.  

## 2021-01-22 NOTE — Telephone Encounter (Addendum)
Plz notify I've sent in prednisone taper for her.  Steroid precautions - take in the morning, caution with carbs/sugar while on steroids, if mood change/feeling bad taking steroids, stop and let us know. Schedule OV if not improved with treatment.  May also do supportive care of calamine lotion, benadryl 12.5-25mg  PRN for itching.

## 2021-01-22 NOTE — Telephone Encounter (Signed)
Do you want to squeeze pt in?

## 2021-01-22 NOTE — Telephone Encounter (Signed)
Pt left VM on triage line stating that she woke up with poison ivy on her face and hands, with one of her eyes shut. She is requesting that something be called in and a call back. No appts available in office today.

## 2021-01-22 NOTE — Addendum Note (Signed)
Addended by: Ria Bush on: 01/22/2021 02:07 PM   Modules accepted: Orders

## 2021-02-09 ENCOUNTER — Other Ambulatory Visit: Payer: Self-pay

## 2021-02-09 ENCOUNTER — Ambulatory Visit (INDEPENDENT_AMBULATORY_CARE_PROVIDER_SITE_OTHER): Payer: Medicare HMO | Admitting: *Deleted

## 2021-02-09 DIAGNOSIS — E538 Deficiency of other specified B group vitamins: Secondary | ICD-10-CM | POA: Diagnosis not present

## 2021-02-09 MED ORDER — CYANOCOBALAMIN 1000 MCG/ML IJ SOLN
1000.0000 ug | Freq: Once | INTRAMUSCULAR | Status: AC
  Administered 2021-02-09: 1000 ug via INTRAMUSCULAR

## 2021-02-09 NOTE — Progress Notes (Signed)
Per orders of Dr. Damita Dunnings, injection of B12 given by Earleen Reaper M in Right Deltoid. Patient tolerated injection well.

## 2021-02-12 ENCOUNTER — Other Ambulatory Visit: Payer: Self-pay | Admitting: Family Medicine

## 2021-02-12 NOTE — Telephone Encounter (Signed)
Last office visit 11/29/2020 for Tick bite.  Last refilled 12/18/2020 for #30 with no refills.  CPE scheduled 09/06/2021.

## 2021-02-15 NOTE — Telephone Encounter (Signed)
ERx 

## 2021-03-02 ENCOUNTER — Telehealth: Payer: Self-pay

## 2021-03-02 DIAGNOSIS — M810 Age-related osteoporosis without current pathological fracture: Secondary | ICD-10-CM

## 2021-03-02 NOTE — Telephone Encounter (Signed)
Prolia VOB initiated via [email or web address]  Last OV: 12/09/20 Next OV: 09/06/21 Last Prolia inj: 10/07/20 Next Prolia inj DUE: 04/10/21

## 2021-03-04 NOTE — Telephone Encounter (Signed)
Prior Auth required

## 2021-03-11 NOTE — Telephone Encounter (Signed)
PA initiated via [email or web address]  Uvaldo Rising ([id number])  Your information has been sent to [facility] ([facility]  Uvaldo Rising ([id number])  Your information has been submitted to [facility]. [employer] will review the request and will issue a decision, typically within 3-7 days from your submission. You can check the updated outcome later by reopening this request.  If [employer] has not responded in 3-7 days or if you have any questions about your ePA request, please contact [employer] at [phone]. If you think there may be a problem with your PA request, use our live chat feature at the bottom right.  For [place] requests, please call [phone].

## 2021-03-15 NOTE — Telephone Encounter (Signed)
Courtney Thomas (Key: [id number])  This request has been approved.  Please note any additional information provided by [facility] at the bottom of your screen.  Courtney Thomas Key: [id number] - PA Case ID: [id number] Need help? Call us at [phone]  Outcome Approved on August 12  PA Case: [id number],  Status: Approved,  Coverage Starts on: 09/23/2020 12:00:00 AM,  Coverage Ends on: 07/31/2021 12:00:00 AM.   Questions? Contact [phone].  Drug Prolia '60MG'$ /ML syringes Form  Nurse, adult and Medical Benefit PA Form

## 2021-03-15 NOTE — Telephone Encounter (Signed)
Pt ready for scheduling on or after 04/10/21  Out-of-pocket cost due at time of visit: $280  Primary: [employer] Prolia co-insurance: 20% (approximately $255) Admin fee co-insurance: 20% ( approximately $25)  Secondary: n/a Prolia co-insurance:  Admin fee co-insurance:   Deductible: does not apply  Prior Auth: APPROVED PA# [id number] Valid: 09/23/20-07/31/21

## 2021-03-17 ENCOUNTER — Other Ambulatory Visit: Payer: Self-pay

## 2021-03-17 ENCOUNTER — Ambulatory Visit (INDEPENDENT_AMBULATORY_CARE_PROVIDER_SITE_OTHER): Payer: Medicare HMO

## 2021-03-17 DIAGNOSIS — E538 Deficiency of other specified B group vitamins: Secondary | ICD-10-CM

## 2021-03-17 MED ORDER — CYANOCOBALAMIN 1000 MCG/ML IJ SOLN
1000.0000 ug | Freq: Once | INTRAMUSCULAR | Status: AC
  Administered 2021-03-17: 1000 ug via INTRAMUSCULAR

## 2021-03-17 NOTE — Progress Notes (Signed)
Patient presented for B 12 injection given by Gal Smolinski, CMA to left deltoid, patient voiced no concerns nor showed any signs of distress during injection.  

## 2021-03-23 NOTE — Telephone Encounter (Signed)
Spoke with patient. Estimated cost will be $295. Lab appointment on 04/06/21 and Nurse visit on 04/13/21

## 2021-03-23 NOTE — Addendum Note (Signed)
Addended by: Kris Mouton on: 03/23/2021 02:01 PM   Modules accepted: Orders

## 2021-04-06 ENCOUNTER — Other Ambulatory Visit (INDEPENDENT_AMBULATORY_CARE_PROVIDER_SITE_OTHER): Payer: Medicare HMO

## 2021-04-06 ENCOUNTER — Other Ambulatory Visit: Payer: Self-pay

## 2021-04-06 DIAGNOSIS — M810 Age-related osteoporosis without current pathological fracture: Secondary | ICD-10-CM | POA: Diagnosis not present

## 2021-04-06 LAB — BASIC METABOLIC PANEL
BUN: 18 mg/dL (ref 6–23)
CO2: 28 mEq/L (ref 19–32)
Calcium: 9 mg/dL (ref 8.4–10.5)
Chloride: 103 mEq/L (ref 96–112)
Creatinine, Ser: 0.85 mg/dL (ref 0.40–1.20)
GFR: 67.87 mL/min (ref >60.00)
Glucose, Bld: 97 mg/dL (ref 70–99)
Potassium: 3.8 mEq/L (ref 3.5–5.1)
Sodium: 140 mEq/L (ref 135–145)

## 2021-04-07 NOTE — Progress Notes (Signed)
Noted. See phone note

## 2021-04-07 NOTE — Telephone Encounter (Signed)
CrCl 53.23 mL/min. Calcium was normal on 04/06/21 9.0  Ok to proceed with Prolia injection.

## 2021-04-13 ENCOUNTER — Other Ambulatory Visit: Payer: Self-pay

## 2021-04-13 ENCOUNTER — Ambulatory Visit (INDEPENDENT_AMBULATORY_CARE_PROVIDER_SITE_OTHER): Payer: Medicare HMO

## 2021-04-13 DIAGNOSIS — M810 Age-related osteoporosis without current pathological fracture: Secondary | ICD-10-CM

## 2021-04-13 MED ORDER — DENOSUMAB 60 MG/ML ~~LOC~~ SOSY
60.0000 mg | PREFILLED_SYRINGE | Freq: Once | SUBCUTANEOUS | Status: AC
  Administered 2021-04-13: 60 mg via SUBCUTANEOUS

## 2021-04-13 NOTE — Progress Notes (Signed)
Per orders of Dr. Danise Mina, injection of Prolia given by Pilar Grammes, in right arm.  Patient tolerated injection well.  Pt will come back next week for B12. Too soon today.

## 2021-04-14 ENCOUNTER — Other Ambulatory Visit: Payer: Self-pay | Admitting: Family Medicine

## 2021-04-14 NOTE — Telephone Encounter (Signed)
ERx 

## 2021-04-14 NOTE — Telephone Encounter (Signed)
Name of Medication: Ambien Name of Pharmacy: [facility] or Written Date and Quantity: 02/15/21, #30 Last Office Visit and Type: 12/09/20, tick bite Next Office Visit and Type: 09/06/21, AWV prt 2 Last Controlled Substance Agreement Date: none Last UDS: none

## 2021-04-20 ENCOUNTER — Ambulatory Visit (INDEPENDENT_AMBULATORY_CARE_PROVIDER_SITE_OTHER): Payer: Medicare HMO

## 2021-04-20 ENCOUNTER — Other Ambulatory Visit: Payer: Self-pay

## 2021-04-20 DIAGNOSIS — E538 Deficiency of other specified B group vitamins: Secondary | ICD-10-CM | POA: Diagnosis not present

## 2021-04-20 DIAGNOSIS — Z23 Encounter for immunization: Secondary | ICD-10-CM | POA: Diagnosis not present

## 2021-04-20 MED ORDER — CYANOCOBALAMIN 1000 MCG/ML IJ SOLN
1000.0000 ug | Freq: Once | INTRAMUSCULAR | Status: AC
  Administered 2021-04-20: 1000 ug via INTRAMUSCULAR

## 2021-04-20 NOTE — Progress Notes (Signed)
Per orders of Dr. Gutierrez, injection of monthly B12 1000 mcg/ml given by Nyles Mitton P Wendel Homeyer, CMA in Right Deltoid. Patient tolerated injection well.  

## 2021-04-29 IMAGING — MG DIGITAL SCREENING BILAT W/ TOMO W/ CAD
8 series · 8 of 24 positions shown · non-contrast
Comparison: Previous exam(s).

CLINICAL DATA: Screening.

EXAM:
DIGITAL SCREENING BILATERAL MAMMOGRAM WITH TOMO AND CAD

[R CC synth-2D]
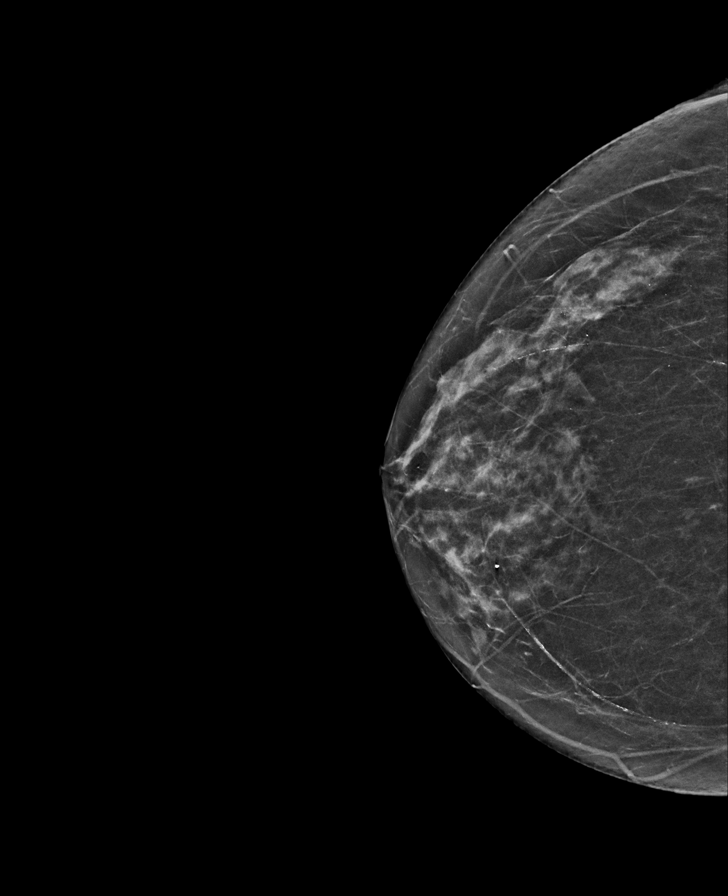

[L MLO synth-2D]
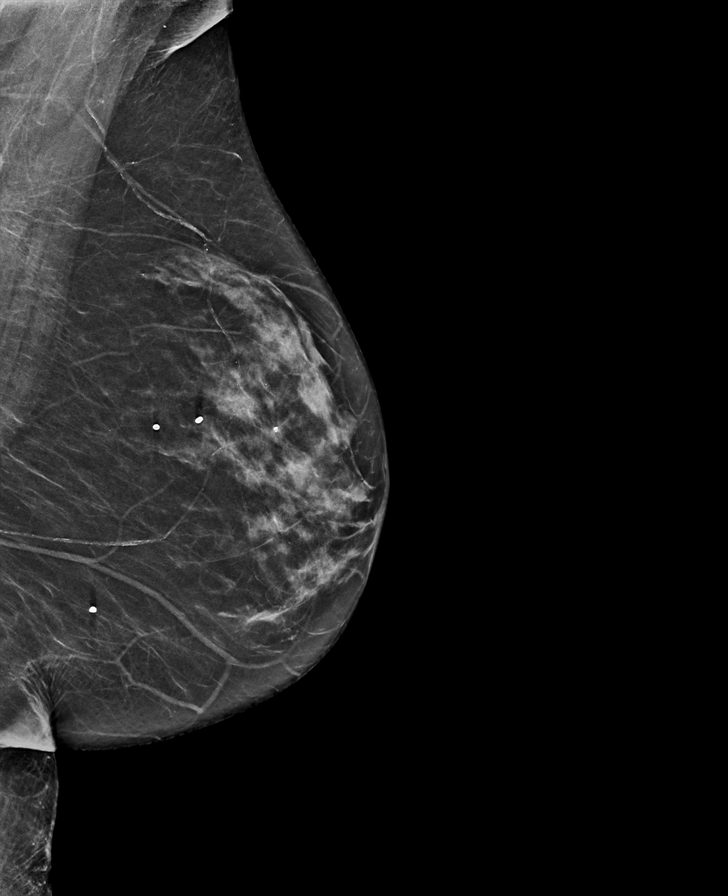

[L CC synth-2D]
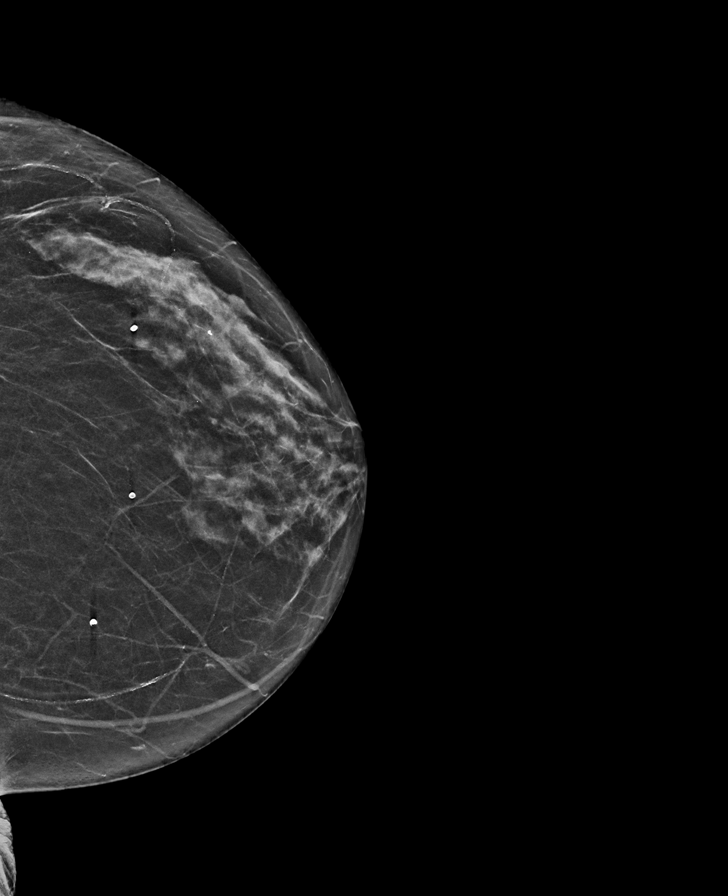

[R MLO synth-2D]
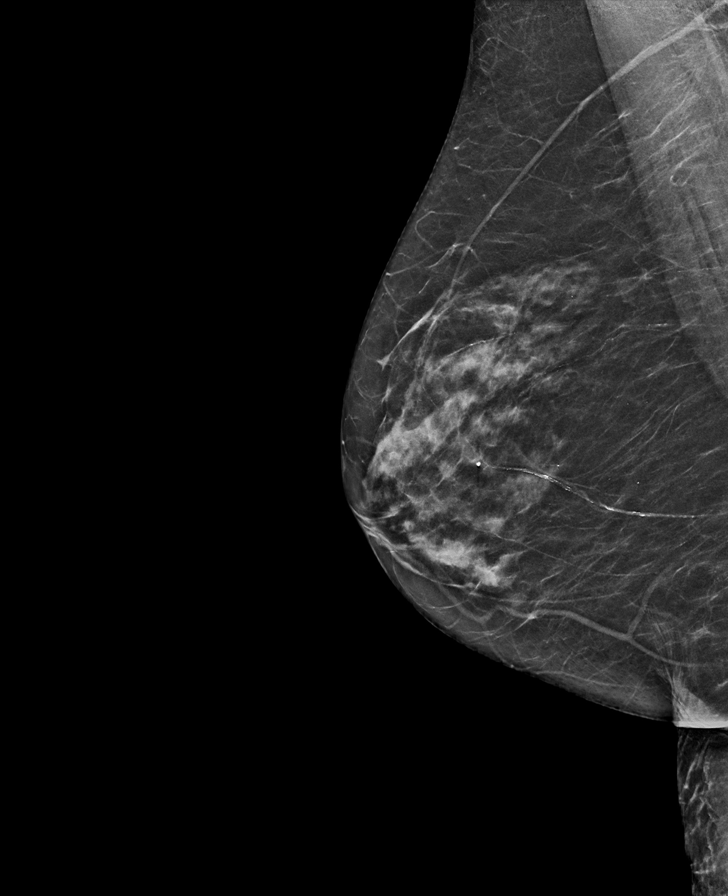

[L CC tomo · tomo slice 29/56.0]
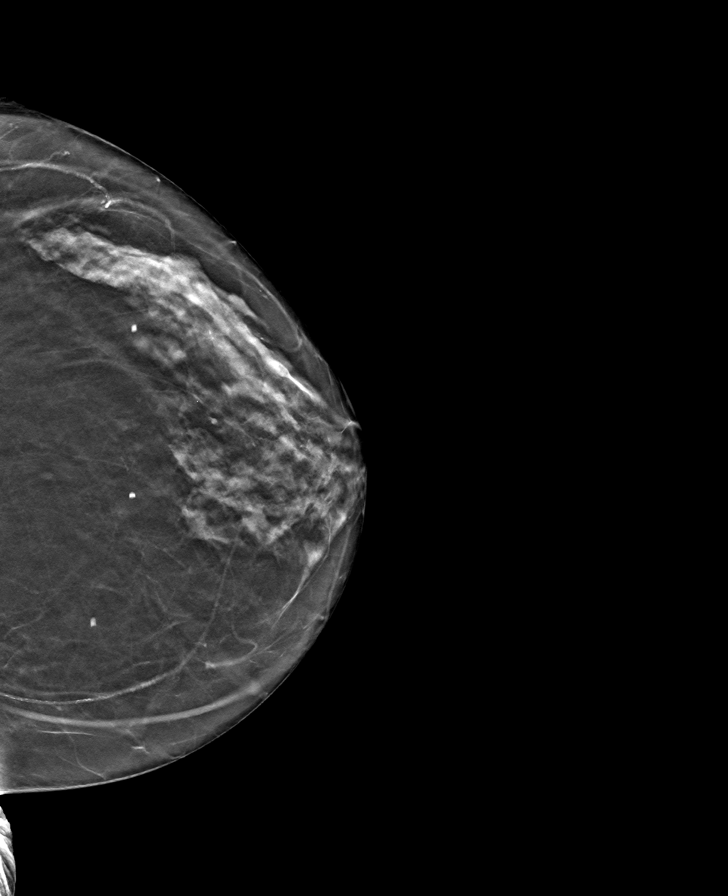

[L MLO tomo · tomo slice 32/63.0]
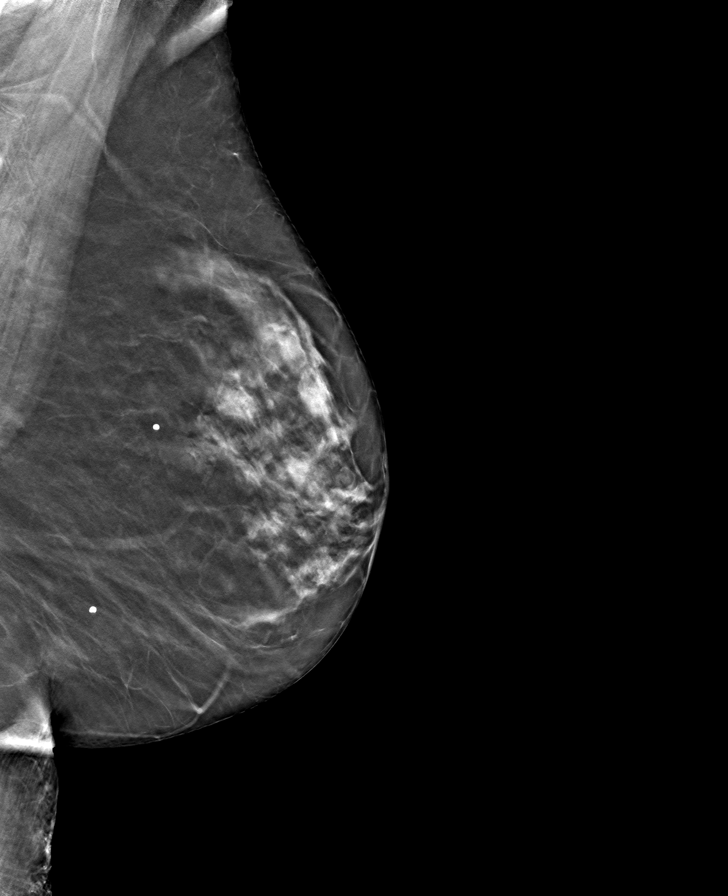

[R MLO tomo · tomo slice 31/61.0]
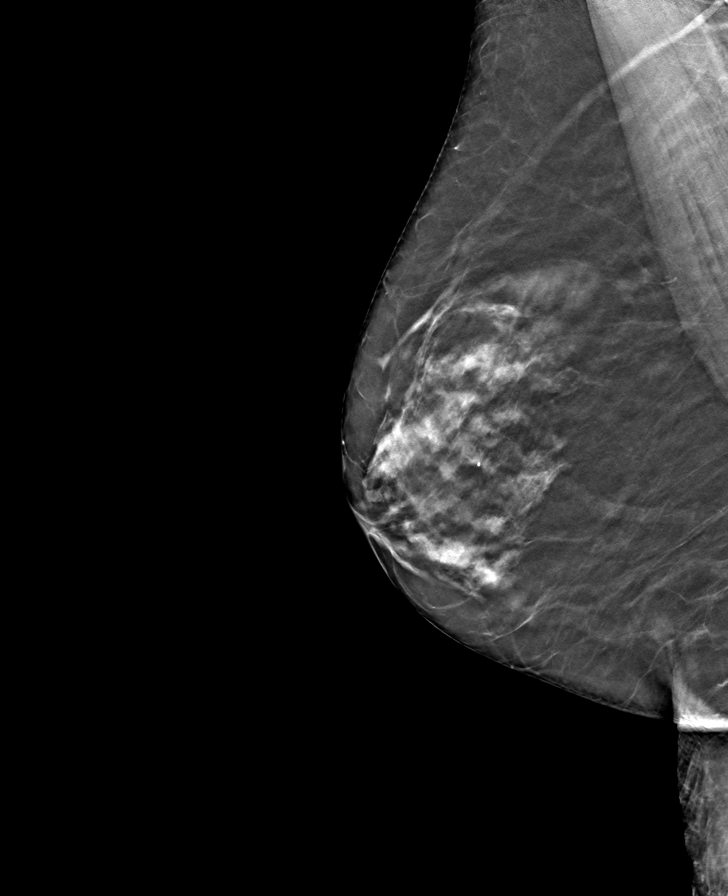

[R CC tomo · tomo slice 29/57.0]
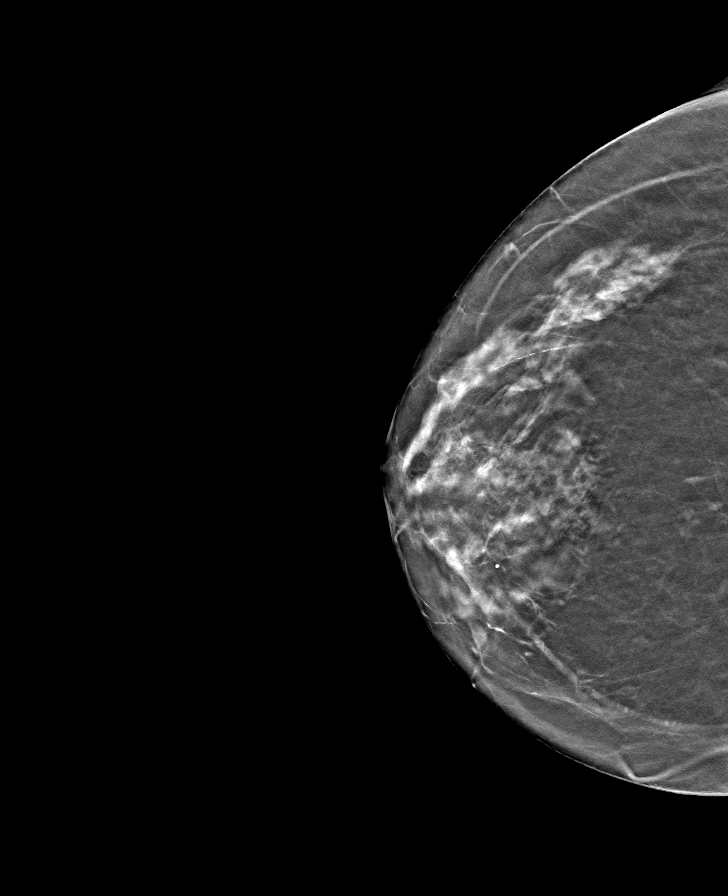

[8 of 24 positions shown; findings below may reference images not displayed]

ACR Breast Density Category c: The breast tissue is heterogeneously
dense, which may obscure small masses.
FINDINGS: There are no findings suspicious for malignancy. Images were
processed with CAD.
IMPRESSION: No mammographic evidence of malignancy. A result letter of this
screening mammogram will be mailed directly to the patient.

RECOMMENDATION:
Screening mammogram in one year. (Code:[id number]-[id number])

BI-RADS CATEGORY  1: Negative.

## 2021-05-05 ENCOUNTER — Other Ambulatory Visit: Payer: Self-pay

## 2021-05-05 ENCOUNTER — Ambulatory Visit: Payer: Medicare HMO

## 2021-05-11 ENCOUNTER — Other Ambulatory Visit: Payer: Medicare HMO

## 2021-05-11 ENCOUNTER — Ambulatory Visit
Admission: EM | Admit: 2021-05-11 | Discharge: 2021-05-11 | Disposition: A | Discharge status: Home / Self Care | Payer: Medicare HMO | Attending: Emergency Medicine | Admitting: Emergency Medicine

## 2021-05-11 ENCOUNTER — Telehealth: Payer: Self-pay

## 2021-05-11 ENCOUNTER — Encounter: Payer: Self-pay | Admitting: Emergency Medicine

## 2021-05-11 ENCOUNTER — Telehealth (INDEPENDENT_AMBULATORY_CARE_PROVIDER_SITE_OTHER): Payer: Medicare HMO | Admitting: Nurse Practitioner

## 2021-05-11 ENCOUNTER — Other Ambulatory Visit: Payer: Self-pay

## 2021-05-11 VITALS — Temp 97.6°F

## 2021-05-11 DIAGNOSIS — R051 Acute cough: Secondary | ICD-10-CM

## 2021-05-11 DIAGNOSIS — J069 Acute upper respiratory infection, unspecified: Secondary | ICD-10-CM

## 2021-05-11 DIAGNOSIS — J029 Acute pharyngitis, unspecified: Secondary | ICD-10-CM | POA: Diagnosis not present

## 2021-05-11 DIAGNOSIS — R52 Pain, unspecified: Secondary | ICD-10-CM | POA: Diagnosis not present

## 2021-05-11 DIAGNOSIS — Z1152 Encounter for screening for COVID-19: Secondary | ICD-10-CM | POA: Diagnosis not present

## 2021-05-11 DIAGNOSIS — R509 Fever, unspecified: Secondary | ICD-10-CM | POA: Insufficient documentation

## 2021-05-11 DIAGNOSIS — U071 COVID-19: Secondary | ICD-10-CM

## 2021-05-11 LAB — POCT RAPID STREP A (OFFICE): Rapid Strep A Screen: NEGATIVE

## 2021-05-11 MED ORDER — BENZONATATE 200 MG PO CAPS: 200.0000 mg | ORAL_CAPSULE | Freq: Three times a day (TID) | ORAL | 0 refills | Status: DC | PRN

## 2021-05-11 MED ORDER — HYDROCOD POLST-CPM POLST ER 10-8 MG/5ML PO SUER: 5.0000 mL | Freq: Two times a day (BID) | ORAL | 0 refills | Status: DC | PRN

## 2021-05-11 MED ORDER — FLUTICASONE PROPIONATE 50 MCG/ACT NA SUSP: 2.0000 | Freq: Every day | NASAL | 0 refills | Status: DC

## 2021-05-11 NOTE — Telephone Encounter (Signed)
Patient went to urgent Care in [place] earlier today and got tested for strep-it was negative, and COVID-still pending. They did not test patient for Flu but she did not want to come to [place] location to have this done and wanted to hold off. FYI to [employer] who ordered these tests originally. Urgent care notes are in epic

## 2021-05-11 NOTE — ED Triage Notes (Addendum)
Pt c/o cough, headache, ST x 4 days.

## 2021-05-11 NOTE — Progress Notes (Signed)
Patient ID: Courtney Thomas, female    DOB: January 20, 1946, 75 y.o.   MRN: [id number]  Virtual visit completed through [employer], a video enabled telemedicine application. Due to national recommendations of social distancing due to COVID-19, a virtual visit is felt to be most appropriate for this patient at this time. Reviewed limitations, risks, security and privacy concerns of performing a virtual visit and the availability of in person appointments. I also reviewed that there may be a patient responsible charge related to this service. The patient agreed to proceed.   Attempted to connect via video enabled device but unsuccessful. Reverted to telephone encounter  Patient location: home Provider location: [place] at [facility], office Persons participating in this virtual visit: patient, provider   If any vitals were documented, they were collected by patient at home unless specified below.    Temp 97.6 F (36.4 C)    CC: Fever, Sore throat Subjective:   HPI: Courtney Thomas is a 75 y.o. female presenting on 05/11/2021 for Cough (On 05/07/21, chest tightness/congestion, some pain between shoulder blades from coughing, dry cough, chills, temp-101, headache, burning in her throat. )   Symptoms started on Friday 05/07/2021. Has not covid tested  Vaccinated against covid x2 and 1 booster Aleve OTC without relief    Relevant past medical, surgical, family and social history reviewed and updated as indicated. Interim medical history since our last visit reviewed. Allergies and medications reviewed and updated. Outpatient Medications Prior to Visit  Medication Sig Dispense Refill   albuterol (VENTOLIN HFA) 108 (90 Base) MCG/ACT inhaler Inhale 2 puffs into the lungs every 6 (six) hours as needed for wheezing or shortness of breath. 16 g 3   atorvastatin (LIPITOR) 10 MG tablet Take 1 tablet (10 mg total) by mouth daily. 90 tablet 3   buPROPion (WELLBUTRIN SR) 150 MG 12 hr tablet Take 1  tablet (150 mg total) by mouth daily. 90 tablet 3   cetirizine (ZYRTEC) 10 MG tablet TAKE ONE TABLET BY MOUTH EVERY DAY 90 tablet 3   Cholecalciferol (VITAMIN D3) 1.25 MG (50000 UT) TABS Take 1 tablet by mouth once a week. 12 tablet 3   DULoxetine (CYMBALTA) 60 MG capsule Take 1 capsule (60 mg total) by mouth daily. 90 capsule 3   lisinopril-hydrochlorothiazide (ZESTORETIC) 10-12.5 MG tablet Take 1 tablet by mouth daily. 90 tablet 3   zolpidem (AMBIEN) 10 MG tablet TAKE 1/2 TO 1 TABLET AT BEDTIME AS NEEDED FOR SLEEP 30 tablet 3   Cholecalciferol (VITAMIN D3) 25 MCG (1000 UT) CAPS Take 1 capsule (1,000 Units total) by mouth daily. (Patient not taking: Reported on 05/11/2021) 30 capsule    doxycycline (VIBRA-TABS) 100 MG tablet Take 1 tablet (100 mg total) by mouth 2 (two) times daily. 20 tablet 0   predniSONE (DELTASONE) 20 MG tablet Take two tablets daily for 4 days followed by one tablet daily for 4 days 12 tablet 0   No facility-administered medications prior to visit.     Per HPI unless specifically indicated in ROS section below Review of Systems  Constitutional:  Positive for chills and fever. Negative for fatigue.  HENT:  Positive for congestion and sore throat.   Respiratory:  Positive for cough and shortness of breath (when she is coughing).   Cardiovascular:  Positive for chest pain (when cough).  Gastrointestinal:  Negative for diarrhea, nausea and vomiting.  Musculoskeletal:  Positive for arthralgias and myalgias.  Neurological:  Positive for headaches.  Objective:  Temp 97.6  F (36.4 C)   Wt Readings from Last 3 Encounters:  12/09/20 126 lb (57.2 kg)  09/01/20 125 lb (56.7 kg)  11/27/19 281 lb 4.9 oz (127.6 kg)      Physical exam: Gen: alert, NAD, not ill appearing Pulm: speaks in complete sentences without increased work of breathing Psych: normal mood, normal thought content      Results for orders placed or performed in visit on 56/43/32  Basic Metabolic Panel   Result Value Ref Range   Sodium 140 135 - 145 mEq/L   Potassium 3.8 3.5 - 5.1 mEq/L   Chloride 103 96 - 112 mEq/L   CO2 28 19 - 32 mEq/L   Glucose, Bld 97 70 - 99 mg/dL   BUN 18 6 - 23 mg/dL   Creatinine, Ser 0.85 0.40 - 1.20 mg/dL   GFR 67.87 >60.00 mL/min   Calcium 9.0 8.4 - 10.5 mg/dL   Assessment & Plan:   Problem List Items Addressed This Visit       Other   Acute cough    Given patient's constellation of symptoms did recommend patient be tested for COVID, flu, strep.  States she was tested for COVID but it was before she turned having the current symptoms she had now.  Patient was still at work advised her to leave work until testing results come back.  Patient is on the cusp of treatment if it ends up being COVID.  We will wait on results.  Continue to use over-the-counter treatments if she will benefit.      Relevant Orders   Novel Coronavirus, NAA ([facility])   Influenza A/B   Sore throat - Primary   Relevant Orders   Novel Coronavirus, NAA ([facility])   Rapid Strep A   Fever   Relevant Orders   Novel Coronavirus, NAA ([facility])   Influenza A/B   Rapid Strep A   Body aches   Relevant Orders   Influenza A/B     No orders of the defined types were placed in this encounter.  No orders of the defined types were placed in this encounter.  Phone encounter lasted 6 mins and 20 seconds  I discussed the assessment and treatment plan with the patient. The patient was provided an opportunity to ask questions and all were answered. The patient agreed with the plan and demonstrated an understanding of the instructions. The patient was advised to call back or seek an in-person evaluation if the symptoms worsen or if the condition fails to improve as anticipated.  Follow up plan: No follow-ups on file.  Romilda Garret, NP

## 2021-05-11 NOTE — ED Provider Notes (Addendum)
HPI  SUBJECTIVE:  Courtney Thomas is a 75 y.o. female who presents with 4 days of nasal congestion, sore throat, "nonstop coughing", body aches, headaches, sinus pain and pressure, wheezing.  No change in her baseline shortness of breath.  No fevers above 100.4, rhinorrhea, postnasal drip, chest pain/tightness, nausea, vomiting, diarrhea, abdominal pain.  No GERD or allergy symptoms.  No drooling, trismus, neck stiffness, voice changes, sensation of throat swelling shot.  No COVID or flu exposure.  She got the COVID booster and this year's flu vaccine.  No antibiotic in the past 3 months.  No antipyretic in the past 6 hours.  She has been using her albuterol once a day without her spacer with some improvement in her wheezing.  She has not tried anything else for this.  No aggravating factors.  She has a past medical history of asthma/COPD and is compliant with her maintenance medications.  She also has a history of allergic rhinitis.  No history of COVID.  ELF:[id number], Garlon Hatchet, MD   Past Medical History:  Diagnosis Date   Adjustment disorder with depressed mood    Asthma    B12 deficiency anemia 2014   mild   Frequent headaches    History of chicken pox    Hyperlipidemia    Hypertension    Insomnia    Osteoporosis    latest dexa 05/2016 T -3.3 spine (unchanged), -2.0 hip - on fosamax since ~2015   Seasonal allergic rhinitis     Past Surgical History:  Procedure Laterality Date   ABDOMINAL HYSTERECTOMY     BREAST CYST ASPIRATION Left 80s   benign   BREAST EXCISIONAL BIOPSY Right 1971   neg   COLONOSCOPY  07/2013   WNL per patient ([place])   dexa  03/2014   T score spine -3.5, hip -2.0   NASAL SINUS SURGERY     deviated septum   TONSILLECTOMY  1970   TOTAL ABDOMINAL HYSTERECTOMY W/ BILATERAL SALPINGOOPHORECTOMY  1994   uterine cyst with heavy bleeding    Family History  Problem Relation Age of Onset   Cancer Mother 77       lung met to brain, smoker   Stroke Mother     Alzheimer's disease Mother    Arthritis Father    Arthritis Sister    Cancer Brother 66       pancreatic   Diabetes Maternal Grandmother    Breast cancer Neg Hx     Social History   Tobacco Use   Smoking status: Former   Smokeless tobacco: Never   Tobacco comments:    minimal smoking as a teenager  Vaping Use   Vaping Use: Never used  Substance Use Topics   Alcohol use: No   Drug use: No    No current facility-administered medications for this encounter.  Current Outpatient Medications:    albuterol (VENTOLIN HFA) 108 (90 Base) MCG/ACT inhaler, Inhale 2 puffs into the lungs every 6 (six) hours as needed for wheezing or shortness of breath., Disp: 16 g, Rfl: 3   atorvastatin (LIPITOR) 10 MG tablet, Take 1 tablet (10 mg total) by mouth daily., Disp: 90 tablet, Rfl: 3   benzonatate (TESSALON) 200 MG capsule, Take 1 capsule (200 mg total) by mouth 3 (three) times daily as needed for cough., Disp: 30 capsule, Rfl: 0   buPROPion (WELLBUTRIN SR) 150 MG 12 hr tablet, Take 1 tablet (150 mg total) by mouth daily., Disp: 90 tablet, Rfl: 3   chlorpheniramine-HYDROcodone (TUSSIONEX PENNKINETIC  ER) 10-8 MG/5ML SUER, Take 5 mLs by mouth every 12 (twelve) hours as needed for cough., Disp: 60 mL, Rfl: 0   Cholecalciferol (VITAMIN D3) 1.25 MG (50000 UT) TABS, Take 1 tablet by mouth once a week., Disp: 12 tablet, Rfl: 3   Cholecalciferol (VITAMIN D3) 25 MCG (1000 UT) CAPS, Take 1 capsule (1,000 Units total) by mouth daily., Disp: 30 capsule, Rfl:    DULoxetine (CYMBALTA) 60 MG capsule, Take 1 capsule (60 mg total) by mouth daily., Disp: 90 capsule, Rfl: 3   fluticasone (FLONASE) 50 MCG/ACT nasal spray, Place 2 sprays into both nostrils daily., Disp: 16 g, Rfl: 0   lisinopril-hydrochlorothiazide (ZESTORETIC) 10-12.5 MG tablet, Take 1 tablet by mouth daily., Disp: 90 tablet, Rfl: 3  Allergies  Allergen Reactions   Eggs Or Egg-Derived Products Anaphylaxis and Swelling   Codeine Nausea Only    Penicillins Rash    .p      ROS  As noted in HPI.   Physical Exam  BP (!) 109/59 (BP Location: Left Arm)   Pulse 94   Temp 98.5 F (36.9 C) (Oral)   Resp 18   SpO2 94%   Constitutional: Well developed, well nourished, no acute distress Eyes:  EOMI, conjunctiva normal bilaterally HENT: Normocephalic, atraumatic,mucus membranes moist.  Clear nasal congestion.  Swollen, erythematous turbinates.  No maxillary, frontal sinus tenderness.  Tonsils surgically absent.  No cobblestoning, postnasal drip. Respiratory: Normal inspiratory effort, lungs clear bilaterally.  Positive lateral chest wall tenderness Cardiovascular: Normal rate, regular rhythm, no murmurs rubs or gallops GI: nondistended skin: No rash, skin intact Musculoskeletal: no deformities Neurologic: Alert & oriented x 3, no focal neuro deficits Psychiatric: Speech and behavior appropriate   ED Course   Medications - No data to display  Orders Placed This Encounter  Procedures   Novel Coronavirus, NAA ([facility])    Standing Status:   Standing    Number of Occurrences:   1    Order Specific Question:   Release to patient    Answer:   Immediate   Coronavirus (COVID-19) with Influenza A and Influenza B    Standing Status:   Standing    Number of Occurrences:   1   POCT rapid strep A    Standing Status:   Standing    Number of Occurrences:   1    No results found for this or any previous visit (from the past 24 hour(s)).  No results found.  Results for orders placed or performed during the hospital encounter of 05/11/21  Coronavirus (COVID-19) with Influenza A and Influenza B   Specimen: Nasopharyngeal   Naso  Result Value Ref Range   SARS-CoV-2, NAA Detected (A) Not Detected   Influenza A, NAA Not Detected Not Detected   Influenza B, NAA Not Detected Not Detected   Test Information: Comment   POCT rapid strep A  Result Value Ref Range   Rapid Strep A Screen Negative Negative     ED Clinical  Impression  1. COVID-19 virus infection   2. Encounter for screening for COVID-19   3. Upper respiratory infection with cough and congestion      ED Assessment/Plan  [place] Narcotic database reviewed for this patient, and feel that the risk/benefit ratio today is favorable for proceeding with a prescription for controlled substance.  Patient with a viral respiratory infection/cough.  Flu, COVID sent.  She is out of the window for antiviral treatment if her flu is positive.  If her COVID comes  back within the treatment window, will prescribe Molnupiravir.  She is 4 days into symptoms today.  Will send home with Flonase, Mucinex, saline nasal irrigation, Tessalon for the cough during the day, Tussionex for the cough.  Advised patient to be careful with the Tussionex, advised that it can increase her risk for falls.  Advised her to not take Ambien if taking Tussionex.  Follow-up with PMD as needed.  ER return precautions given.  COVID, flu pending at the time of signing of this note on 10/12  COVID positive.  Flu negative.  Lab did not come back until this morning, 10/13.  Unfortunately, she is out of the window for treatment.  Yesterday would have been the last day that she was eligible for treatment.  Discussed MDM, treatment plan, and plan for follow-up with patient. Discussed sn/sx that should prompt return to the ED. patient agrees with plan.   Meds ordered this encounter  Medications   benzonatate (TESSALON) 200 MG capsule    Sig: Take 1 capsule (200 mg total) by mouth 3 (three) times daily as needed for cough.    Dispense:  30 capsule    Refill:  0   chlorpheniramine-HYDROcodone (TUSSIONEX PENNKINETIC ER) 10-8 MG/5ML SUER    Sig: Take 5 mLs by mouth every 12 (twelve) hours as needed for cough.    Dispense:  60 mL    Refill:  0   fluticasone (FLONASE) 50 MCG/ACT nasal spray    Sig: Place 2 sprays into both nostrils daily.    Dispense:  16 g    Refill:  0      *This clinic note was  created using Lobby[employer]. Therefore, there may be occasional mistakes despite careful proofreading.  ?    Melynda Ripple, MD 05/12/21 [id number]    Melynda Ripple, MD 05/12/21 Wonda Amis    Melynda Ripple, MD 05/13/21 [phone]

## 2021-05-11 NOTE — Discharge Instructions (Addendum)
Try Flonase, saline nasal irrigation with Milta Deiters Med rinse and distilled water as often as you want for the nasal congestion.  Can also try some Mucinex.  Tessalon for the cough during the day, and if this does not work, I am sending you home with Tussionex, which has hydrocodone in it.  Please be careful with it.  It has a lot of side effects and can increase your risk for falls.  However, it will stop the cough.  Do NOT take Ambien  if you are taking the Tussionex.  2 puffs from your albuterol inhaler with your spacer every 4 hours as needed.  We will contact you if your flu or COVID come back positive.  Unfortunately, you are out of the window for antiviral treatment for flu, but if your COVID comes back in time, I will prescribe you Molnupiravir.

## 2021-05-11 NOTE — Assessment & Plan Note (Signed)
Given patient's constellation of symptoms did recommend patient be tested for COVID, flu, strep.  States she was tested for COVID but it was before she turned having the current symptoms she had now.  Patient was still at work advised her to leave work until testing results come back.  Patient is on the cusp of treatment if it ends up being COVID.  We will wait on results.  Continue to use over-the-counter treatments if she will benefit.

## 2021-05-12 LAB — COVID-19, FLU A+B NAA
Influenza A, NAA: NOT DETECTED
Influenza B, NAA: NOT DETECTED
SARS-CoV-2, NAA: DETECTED — AB

## 2021-05-17 ENCOUNTER — Other Ambulatory Visit: Payer: Self-pay | Admitting: Family Medicine

## 2021-05-25 ENCOUNTER — Ambulatory Visit: Payer: Medicare HMO

## 2021-06-02 ENCOUNTER — Ambulatory Visit: Payer: Medicare HMO

## 2021-06-02 ENCOUNTER — Ambulatory Visit (INDEPENDENT_AMBULATORY_CARE_PROVIDER_SITE_OTHER): Payer: Medicare HMO

## 2021-06-02 ENCOUNTER — Other Ambulatory Visit: Payer: Self-pay

## 2021-06-02 DIAGNOSIS — E538 Deficiency of other specified B group vitamins: Secondary | ICD-10-CM

## 2021-06-02 MED ORDER — CYANOCOBALAMIN 1000 MCG/ML IJ SOLN
1000.0000 ug | Freq: Once | INTRAMUSCULAR | Status: AC
  Administered 2021-06-02: 1000 ug via INTRAMUSCULAR

## 2021-06-02 NOTE — Progress Notes (Signed)
Per orders of Dr. Danise Mina, injection of monthly B12 1000 mcg/ml given by Pilar Grammes, in Left Deltoid. Patient tolerated injection well.

## 2021-06-23 ENCOUNTER — Other Ambulatory Visit: Payer: Self-pay | Admitting: Family Medicine

## 2021-06-23 DIAGNOSIS — Z1231 Encounter for screening mammogram for malignant neoplasm of breast: Secondary | ICD-10-CM

## 2021-06-30 ENCOUNTER — Other Ambulatory Visit: Payer: Self-pay | Admitting: Family Medicine

## 2021-07-05 ENCOUNTER — Ambulatory Visit (INDEPENDENT_AMBULATORY_CARE_PROVIDER_SITE_OTHER): Payer: Medicare HMO | Admitting: *Deleted

## 2021-07-05 ENCOUNTER — Other Ambulatory Visit: Payer: Self-pay

## 2021-07-05 DIAGNOSIS — E538 Deficiency of other specified B group vitamins: Secondary | ICD-10-CM | POA: Diagnosis not present

## 2021-07-05 MED ORDER — CYANOCOBALAMIN 1000 MCG/ML IJ SOLN
1000.0000 ug | Freq: Once | INTRAMUSCULAR | Status: AC
  Administered 2021-07-05: 1000 ug via INTRAMUSCULAR

## 2021-07-05 NOTE — Progress Notes (Signed)
Patient presented for B 12 injection to right deltoid, patient voiced no concerns nor showed any signs of distress during injection. 

## 2021-07-06 ENCOUNTER — Other Ambulatory Visit: Payer: Self-pay | Admitting: Family Medicine

## 2021-08-02 ENCOUNTER — Other Ambulatory Visit: Payer: Self-pay

## 2021-08-02 ENCOUNTER — Encounter: Payer: Self-pay | Admitting: Emergency Medicine

## 2021-08-02 ENCOUNTER — Emergency Department
Admission: EM | Admit: 2021-08-02 | Discharge: 2021-08-02 | Disposition: A | Discharge status: Home / Self Care | Payer: Medicare HMO | Attending: Emergency Medicine | Admitting: Emergency Medicine

## 2021-08-02 ENCOUNTER — Emergency Department: Payer: Medicare HMO

## 2021-08-02 DIAGNOSIS — N390 Urinary tract infection, site not specified: Secondary | ICD-10-CM | POA: Diagnosis not present

## 2021-08-02 DIAGNOSIS — J069 Acute upper respiratory infection, unspecified: Secondary | ICD-10-CM

## 2021-08-02 DIAGNOSIS — R509 Fever, unspecified: Secondary | ICD-10-CM | POA: Diagnosis not present

## 2021-08-02 DIAGNOSIS — J439 Emphysema, unspecified: Secondary | ICD-10-CM | POA: Diagnosis not present

## 2021-08-02 DIAGNOSIS — Z20822 Contact with and (suspected) exposure to covid-19: Secondary | ICD-10-CM | POA: Diagnosis not present

## 2021-08-02 DIAGNOSIS — B9789 Other viral agents as the cause of diseases classified elsewhere: Secondary | ICD-10-CM | POA: Diagnosis not present

## 2021-08-02 DIAGNOSIS — I10 Essential (primary) hypertension: Secondary | ICD-10-CM | POA: Diagnosis not present

## 2021-08-02 DIAGNOSIS — J45909 Unspecified asthma, uncomplicated: Secondary | ICD-10-CM | POA: Insufficient documentation

## 2021-08-02 DIAGNOSIS — R197 Diarrhea, unspecified: Secondary | ICD-10-CM | POA: Diagnosis not present

## 2021-08-02 DIAGNOSIS — R110 Nausea: Secondary | ICD-10-CM | POA: Diagnosis not present

## 2021-08-02 DIAGNOSIS — B349 Viral infection, unspecified: Secondary | ICD-10-CM | POA: Insufficient documentation

## 2021-08-02 DIAGNOSIS — R059 Cough, unspecified: Secondary | ICD-10-CM | POA: Diagnosis not present

## 2021-08-02 LAB — COMPREHENSIVE METABOLIC PANEL
ALT: 20 U/L (ref 0–44)
AST: 14 U/L — ABNORMAL LOW (ref 15–41)
Albumin: 3.7 g/dL (ref 3.5–5.0)
Alkaline Phosphatase: 63 U/L (ref 38–126)
Anion gap: 9 (ref 5–15)
BUN: 18 mg/dL (ref 8–23)
CO2: 25 mmol/L (ref 22–32)
Calcium: 8.2 mg/dL — ABNORMAL LOW (ref 8.9–10.3)
Chloride: 102 mmol/L (ref 98–111)
Creatinine, Ser: 0.75 mg/dL (ref 0.44–1.00)
GFR, Estimated: >60 mL/min (ref >60)
Glucose, Bld: 95 mg/dL (ref 70–99)
Potassium: 2.6 mmol/L — CL (ref 3.5–5.1)
Sodium: 136 mmol/L (ref 135–145)
Total Bilirubin: 0.7 mg/dL (ref 0.3–1.2)
Total Protein: 6.9 g/dL (ref 6.5–8.1)

## 2021-08-02 LAB — URINALYSIS, COMPLETE (UACMP) WITH MICROSCOPIC
Bilirubin Urine: NEGATIVE
Glucose, UA: NEGATIVE mg/dL
Ketones, ur: 5 mg/dL — AB
Nitrite: POSITIVE — AB
Protein, ur: 30 mg/dL — AB
Specific Gravity, Urine: 1.02 (ref 1.005–1.030)
WBC, UA: >50 WBC/hpf — ABNORMAL HIGH (ref 0–5)
pH: 5 (ref 5.0–8.0)

## 2021-08-02 LAB — CBC
HCT: 35.4 % — ABNORMAL LOW (ref 36.0–46.0)
Hemoglobin: 11.2 g/dL — ABNORMAL LOW (ref 12.0–15.0)
MCH: 26.5 pg (ref 26.0–34.0)
MCHC: 31.6 g/dL (ref 30.0–36.0)
MCV: 83.7 fL (ref 80.0–100.0)
Platelets: 226 10*3/uL (ref 150–400)
RBC: 4.23 MIL/uL (ref 3.87–5.11)
RDW: 15.1 % (ref 11.5–15.5)
WBC: 4.8 10*3/uL (ref 4.0–10.5)
nRBC: 0 % (ref 0.0–0.2)

## 2021-08-02 LAB — RESP PANEL BY RT-PCR (FLU A&B, COVID) ARPGX2
Influenza A by PCR: NEGATIVE
Influenza B by PCR: NEGATIVE
SARS Coronavirus 2 by RT PCR: NEGATIVE

## 2021-08-02 MED ORDER — CEPHALEXIN 500 MG PO CAPS: 500.0000 mg | ORAL_CAPSULE | Freq: Two times a day (BID) | ORAL | 0 refills | Status: DC

## 2021-08-02 MED ORDER — ONDANSETRON 4 MG PO TBDP: 4.0000 mg | ORAL_TABLET | Freq: Four times a day (QID) | ORAL | 0 refills | Status: DC | PRN

## 2021-08-02 MED ORDER — KETOROLAC TROMETHAMINE 30 MG/ML IJ SOLN
15.0000 mg | Freq: Once | INTRAMUSCULAR | Status: AC
  Administered 2021-08-02: 15 mg via INTRAVENOUS
  Filled 2021-08-02: qty 1

## 2021-08-02 MED ORDER — POTASSIUM CHLORIDE ER 10 MEQ PO TBCR: 10.0000 meq | EXTENDED_RELEASE_TABLET | Freq: Every day | ORAL | 0 refills | Status: DC

## 2021-08-02 MED ORDER — ONDANSETRON HCL 4 MG/2ML IJ SOLN
4.0000 mg | Freq: Once | INTRAMUSCULAR | Status: AC
  Administered 2021-08-02: 4 mg via INTRAVENOUS
  Filled 2021-08-02: qty 2

## 2021-08-02 MED ORDER — POTASSIUM CHLORIDE 20 MEQ PO PACK
40.0000 meq | PACK | ORAL | Status: AC
  Administered 2021-08-02: 40 meq via ORAL
  Filled 2021-08-02: qty 2

## 2021-08-02 MED ORDER — SODIUM CHLORIDE 0.9 % IV BOLUS
1000.0000 mL | Freq: Once | INTRAVENOUS | Status: AC
  Administered 2021-08-02: 1000 mL via INTRAVENOUS

## 2021-08-02 MED ORDER — ONDANSETRON 4 MG PO TBDP
4.0000 mg | ORAL_TABLET | Freq: Once | ORAL | Status: AC
  Administered 2021-08-02: 4 mg via ORAL
  Filled 2021-08-02: qty 1

## 2021-08-02 MED ORDER — SODIUM CHLORIDE 0.9 % IV SOLN
1.0000 g | Freq: Once | INTRAVENOUS | Status: AC
  Administered 2021-08-02: 1 g via INTRAVENOUS
  Filled 2021-08-02: qty 10

## 2021-08-02 NOTE — ED Provider Notes (Signed)
[facility] Provider Note    Event Date/Time   First MD Initiated Contact with Patient 08/02/21 1411     (approximate)  History   Chief Complaint: Cough  HPI  Courtney Thomas is a 76 y.o. female with a past medical history of asthma, hypertension, hyperlipidemia who presents to the emergency department for 1.5 days of nausea vomiting diarrhea cough and subjective fever/chills.  According to the patient since yesterday morning she has been experiencing nausea vomiting and diarrhea she has experienced a cough but denies any congestion.  States subjective chills but has not measured a temperature.  Patient does describe generalized body aches and pains.  Physical Exam   Triage Vital Signs: ED Triage Vitals [08/02/21 1350]  Enc Vitals Group     BP (!) 149/60     Pulse Rate (!) 102     Resp 20     Temp 98.2 F (36.8 C)     Temp Source Oral     SpO2 100 %     Weight 115 lb (52.2 kg)     Height 4\' 11"  (1.499 m)     Head Circumference      Peak Flow      Pain Score 10     Pain Loc      Pain Edu?      Excl. in [place]?     Most recent vital signs: Vitals:   08/02/21 1350  BP: (!) 149/60  Pulse: (!) 102  Resp: 20  Temp: 98.2 F (36.8 C)  SpO2: 100%    General: Awake, no distress.  CV:  Good peripheral perfusion.  Regular rate and rhythm  Resp:  Normal effort.  Equal breath sounds bilaterally.  Abd:  No distention.  Soft, nontender.  No rebound or guarding. Other:  Somewhat dry appearing mucous membranes.  Overall well-appearing, nontoxic.   ED Results / Procedures / Treatments   RADIOLOGY  I have personally reviewed the chest x-ray images.  No acute findings on my examination.  Radiology read pending.  Radiology reads the chest x-ray as as no acute cardiopulmonary disease.   MEDICATIONS ORDERED IN ED: Medications  sodium chloride 0.9 % bolus 1,000 mL (has no administration in time range)  ondansetron (ZOFRAN) injection 4 mg (has no  administration in time range)  ketorolac (TORADOL) 30 MG/ML injection 15 mg (has no administration in time range)     IMPRESSION / MDM / ASSESSMENT AND PLAN / ED COURSE  I reviewed the triage vital signs and the nursing notes.  Patient presents emergency department for nausea vomiting diarrhea cough and body aches since yesterday morning.  Overall patient appears well she does appear mildly dehydrated with somewhat dry appearing mucous membranes.  Vital signs show slight tachycardia 102 otherwise reassuring.  Differential would include infectious etiologies such as viral gastroenteritis, influenza/COVID, as well as electrolyte or metabolic abnormality, dehydration.  We will place an IV and dose 1 L of normal saline to hydrate the patient, Zofran for nausea as well as Toradol for body aches.  Lab work is pending.  Patient states she tested positive for COVID 2 months ago.  I reviewed patient's what appears to be her most recent PCP visit was 10/09/2018 with Dr. Deloria Lair what appears to have bronchitis/asthma exacerbation at that time.  No newer PCP notes available in the system.  Patient's CBC is normal including a normal white blood cell count.  Remainder the lab work is pending.  Patient care signed out  to Dr. Jacqualine Code, patient receiving IV fluids and medications currently.   FINAL CLINICAL IMPRESSION(S) / ED DIAGNOSES   Viral illness Nausea vomiting diarrhea   Note:  This document was prepared using Dragon voice recognition software and may include unintentional dictation errors.   Harvest Dark, MD 08/02/21 [phone]

## 2021-08-02 NOTE — ED Triage Notes (Signed)
Pt via POV from home. Pt c/o cough, nausea, diarrhea, fever, and generalized body ache. Pt has a hx of asthma and COPD. Pt is A&Ox4 and NAD.

## 2021-08-02 NOTE — ED Notes (Signed)
See triage note  presents with cough,body aches for couple of days    aslo havign some n/v/d  afebrile on arrival

## 2021-08-02 NOTE — ED Provider Notes (Signed)
Vitals:   08/02/21 1350 08/02/21 1533  BP: (!) 149/60 109/60  Pulse: (!) 102 94  Resp: 20 20  Temp: 98.2 F (36.8 C)   SpO2: 100% 97%     Patient feels improved.  Resting at this time.  She has been having some loose stool but no specific symptoms of urinary tract infection such as burning but has had nausea loose stools generalized fatigue.  Her urinalysis here looks somewhat contaminated but the same time is positive for bacteria and large amount of leukocytes and nitrites.  Possible UTI.  Has penicillin allergy which she reports to be a mild rash in the distant past.  Will dose Rocephin here, also sent home with prescription for Zofran and cephalexin.  Careful return precautions discussed with patient is in agreement.  She is also willing to provide a stool sample for the lab to process as an outpatient to evaluate for possible causes of gastroenterocolitis like symptoms.  No associated abdominal pain.  Very reassuring exam fully awake and alert.  Return precautions and treatment recommendations and follow-up discussed with the patient who is agreeable with the plan.    Delman Kitten, MD 08/02/21 [phone]

## 2021-08-03 ENCOUNTER — Telehealth: Payer: Self-pay

## 2021-08-03 NOTE — Telephone Encounter (Signed)
Per chart review tab pt was at [facility] ED on 08/02/21. Sending to Dr Darnell Level and Lattie Haw CMA.

## 2021-08-03 NOTE — Telephone Encounter (Signed)
[place] Night - Client TELEPHONE ADVICE RECORD [facility] Patient Name: Courtney Thomas ALL Gender: Female DOB: 10/26/1945 Age: 76 Y 11 M 13 D Return Phone Number: [phone] (Primary) Address: City/ State/ Zip: [place]  [address] Client [place] Night - Client Client Site [place] Provider Ria Bush - MD Contact Type Call Who Is Calling Patient / Member / Family / Caregiver Call Type Triage / Clinical Relationship To Patient Self Return Phone Number [phone] (Primary) Chief Complaint Vomiting Reason for Call Symptomatic / Request for [place] states she has the flu. Vomiting and diarrhea. Translation No Nurse Assessment Nurse: Velta Addison, RN, Helene Kelp Date/Time Eilene Ghazi Time): 08/02/2021 10:58:39 AM Confirm and document reason for call. If symptomatic, describe symptoms. ---[name] states that she has had vomiting and diarrhea x 2 days. Reports that she also has a fever of 101(oral). Does the patient have any new or worsening symptoms? ---Yes Will a triage be completed? ---Yes Related visit to physician within the last 2 weeks? ---No Does the PT have any chronic conditions? (i.e. diabetes, asthma, this includes High risk factors for pregnancy, etc.) ---Yes List chronic conditions. ---HTN, Asthma, COPD Is this a behavioral health or substance abuse call? ---No Guidelines Guideline Title Affirmed Question Affirmed Notes Nurse Date/Time (Eastern Time) Vomiting Shock suspected (e.g., cold/pale/ clammy skin, too weak to stand, low BP, rapid pulse) Velta Addison, RN, Helene Kelp 08/02/2021 11:00:55 AM Disp. Time Eilene Ghazi Time) Disposition Final User 08/02/2021 11:06:19 AM 911 Outcome Documentation Velta Addison, RN, Helene Kelp Reason: Family is driving to ER 08/04/863 11:02:02 AM Call EMS 911 Now Yes Velta Addison, RN, Helene Kelp PLEASE NOTE: All timestamps contained  within this report are represented as [place] Standard Time. CONFIDENTIALTY NOTICE: This fax transmission is intended only for the addressee. It contains information that is legally privileged, confidential or otherwise protected from use or disclosure. If you are not the intended recipient, you are strictly prohibited from reviewing, disclosing, copying using or disseminating any of this information or taking any action in reliance on or regarding this information. If you have received this fax in error, please notify us immediately by telephone so that we can arrange for its return to [place]. Phone: [phone], Toll-Free: [phone], Fax: [phone] Page: 2 of 2 Call Id: [id number] [place] Disagree/Comply Comply [name] Understands Yes PreDisposition Search internet for information Care Advice Given Per Guideline CARE ADVICE per Vomiting (Adult) guideline. CALL EMS 911 NOW: * Immediate medical attention is needed. You need to hang up and call 911 (or an ambulance). * Lie down with feet elevated. * Triager Discretion: I'll call you back in a few minutes to be sure you were able to reach them

## 2021-08-03 NOTE — Telephone Encounter (Addendum)
Seen at ER, dx viral URI and UTI treated with abx.

## 2021-08-10 ENCOUNTER — Other Ambulatory Visit: Payer: Self-pay

## 2021-08-10 ENCOUNTER — Ambulatory Visit (INDEPENDENT_AMBULATORY_CARE_PROVIDER_SITE_OTHER): Payer: Medicare HMO

## 2021-08-10 DIAGNOSIS — E538 Deficiency of other specified B group vitamins: Secondary | ICD-10-CM

## 2021-08-10 MED ORDER — CYANOCOBALAMIN 1000 MCG/ML IJ SOLN
1000.0000 ug | Freq: Once | INTRAMUSCULAR | Status: AC
  Administered 2021-08-10: 1000 ug via INTRAMUSCULAR

## 2021-08-10 NOTE — Progress Notes (Signed)
Per orders of Dr. Danise Mina, injection of monthly B12 1000 mcg/ml given by Pilar Grammes, CMA in Left Deltoid. Patient tolerated injection well.  She is due to see Dr Danise Mina in February. I advised pt to see Dr Danise Mina so she gets her levels checked and then decide if she needs to continue B12 injections. She missed last month's injection.

## 2021-08-17 ENCOUNTER — Other Ambulatory Visit: Payer: Self-pay | Admitting: Family Medicine

## 2021-08-26 ENCOUNTER — Other Ambulatory Visit: Payer: Self-pay | Admitting: Family Medicine

## 2021-08-26 DIAGNOSIS — E538 Deficiency of other specified B group vitamins: Secondary | ICD-10-CM

## 2021-08-26 DIAGNOSIS — E785 Hyperlipidemia, unspecified: Secondary | ICD-10-CM

## 2021-08-26 DIAGNOSIS — E559 Vitamin D deficiency, unspecified: Secondary | ICD-10-CM

## 2021-08-26 NOTE — Progress Notes (Signed)
Subjective:   Courtney Thomas is a 76 y.o. female who presents for Medicare Annual (Subsequent) preventive examination.  I connected with Uvaldo Rising today by telephone and verified that I am speaking with the correct person using two identifiers. Location patient: home Location provider: work Persons participating in the virtual visit: patient, Marine scientist.    I discussed the limitations, risks, security and privacy concerns of performing an evaluation and management service by telephone and the availability of in person appointments. I also discussed with the patient that there may be a patient responsible charge related to this service. The patient expressed understanding and verbally consented to this telephonic visit.    Interactive audio and video telecommunications were attempted between this provider and patient, however failed, due to patient having technical difficulties OR patient did not have access to video capability.  We continued and completed visit with audio only.  Some vital signs may be absent or patient reported.   Time Spent with patient on telephone encounter: 20 minutes  Review of Systems     Cardiac Risk Factors include: advanced age (>77mn, >>2women);hypertension;dyslipidemia     Objective:    Today's Vitals   08/27/21 0855  Weight: 115 lb (52.2 kg)  Height: _0  (1.473 m)   Body mass index is 24.04 kg/m.  Advanced Directives 08/27/2021 08/02/2021 08/26/2020 04/05/2019 03/28/2018 03/27/2017 03/25/2016  Does Patient Have a Medical Advance Directive? Yes No _1   Type of AParamed[place]of [place] - [place] will Living will [place] will Living will;Healthcare Power of [place]  Does patient want to make changes to medical advance directive? Yes (MAU/Ambulatory/Procedural Areas - Information given) - - - - - No - Patient  declined  Copy of [place] Chart? - - No - copy requested - No - copy requested No - copy requested No - copy requested    Current Medications (verified) Outpatient Encounter Medications as of 08/27/2021  Medication Sig   albuterol (VENTOLIN HFA) 108 (90 Base) MCG/ACT inhaler Inhale 2 puffs into the lungs every 6 (six) hours as needed for wheezing or shortness of breath.   atorvastatin (LIPITOR) 10 MG tablet Take 1 tablet (10 mg total) by mouth daily.   benzonatate (TESSALON) 200 MG capsule Take 1 capsule (200 mg total) by mouth 3 (three) times daily as needed for cough.   buPROPion (WELLBUTRIN SR) 150 MG 12 hr tablet TAKE 1 TABLET BY MOUTH DAILY   chlorpheniramine-HYDROcodone (TUSSIONEX PENNKINETIC ER) 10-8 MG/5ML SUER Take 5 mLs by mouth every 12 (twelve) hours as needed for cough.   Cholecalciferol (VITAMIN D3) 1.25 MG (50000 UT) TABS Take 1 tablet by mouth once a week.   Cholecalciferol (VITAMIN D3) 25 MCG (1000 UT) CAPS Take 1 capsule (1,000 Units total) by mouth daily.   DULoxetine (CYMBALTA) 60 MG capsule Take 1 capsule (60 mg total) by mouth daily.   fluticasone (FLONASE) 50 MCG/ACT nasal spray Place 2 sprays into both nostrils daily.   lisinopril-hydrochlorothiazide (ZESTORETIC) 10-12.5 MG tablet TAKE 1 TABLET BY MOUTH DAILY   ondansetron (ZOFRAN-ODT) 4 MG disintegrating tablet Take 1 tablet (4 mg total) by mouth every 6 (six) hours as needed for nausea or vomiting.   potassium chloride (KLOR-CON) 10 MEQ tablet Take 1 tablet (10 mEq total) by mouth daily.   cephALEXin (KEFLEX) 500 MG capsule Take 1 capsule (500 mg total) by mouth 2 (two)  times daily. (Patient not taking: Reported on 08/27/2021)   No facility-administered encounter medications on file as of 08/27/2021.    Allergies (verified) Eggs or egg-derived products, Codeine, and Penicillins   History: Past Medical History:  Diagnosis Date   Adjustment disorder with depressed mood    Asthma    B12  deficiency anemia 2014   mild   Frequent headaches    History of chicken pox    Hyperlipidemia    Hypertension    Insomnia    Osteoporosis    latest dexa 05/2016 T -3.3 spine (unchanged), -2.0 hip - on fosamax since ~2015   Seasonal allergic rhinitis    Past Surgical History:  Procedure Laterality Date   ABDOMINAL HYSTERECTOMY     BREAST CYST ASPIRATION Left 80s   benign   BREAST EXCISIONAL BIOPSY Right 1971   neg   COLONOSCOPY  07/2013   WNL per patient ([place])   dexa  03/2014   T score spine -3.5, hip -2.0   NASAL SINUS SURGERY     deviated septum   TONSILLECTOMY  1970   TOTAL ABDOMINAL HYSTERECTOMY W/ BILATERAL SALPINGOOPHORECTOMY  1994   uterine cyst with heavy bleeding   Family History  Problem Relation Age of Onset   Cancer Mother 24       lung met to brain, smoker   Stroke Mother    Alzheimer's disease Mother    Arthritis Father    Arthritis Sister    Cancer Brother 42       pancreatic   Diabetes Maternal Grandmother    Breast cancer Neg Hx    Social History   Socioeconomic History   Marital status: Widowed    Spouse name: Not on file   Number of children: Not on file   Years of education: Not on file   Highest education level: Not on file  Occupational History   Not on file  Tobacco Use   Smoking status: Former   Smokeless tobacco: Never   Tobacco comments:    minimal smoking as a teenager  Vaping Use   Vaping Use: Never used  Substance and Sexual Activity   Alcohol use: No   Drug use: No   Sexual activity: Not Currently  Other Topics Concern   Not on file  Social History Narrative   Lives alone - husband passed away 28. 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   Activity: no regular exercise   Diet: some water daily, fruits/vegetables daily   Social Determinants of Health   Financial Resource Strain: Low Risk    Difficulty of Paying Living Expenses: Not hard at all  Food  Insecurity: No Food Insecurity   Worried About Charity fundraiser in the Last Year: Never true   Arboriculturist in the Last Year: Never true  Transportation Needs: No Transportation Needs   Lack of Transportation (Medical): No   Lack of Transportation (Non-Medical): No  Physical Activity: Inactive   Days of Exercise per Week: 0 days   Minutes of Exercise per Session: 0 min  Stress: No Stress Concern Present   Feeling of Stress : Not at all  Social Connections: Moderately Isolated   Frequency of Communication with Friends and Family: More than three times a week   Frequency of Social Gatherings with Friends and Family: More than three times a week   Attends Religious Services: More than 4 times per year  Active Member of Clubs or Organizations: No   Attends Archivist Meetings: Never   Marital Status: Widowed    Tobacco Counseling Counseling given: Not Answered Tobacco comments: minimal smoking as a teenager   Clinical Intake:  Pre-visit preparation completed: Yes  Pain : No/denies pain     BMI - recorded: 24.04 Nutritional Status: BMI of 19-24  Normal Nutritional Risks: None Diabetes: No  How often do you need to have someone help you when you read instructions, pamphlets, or other written materials from your doctor or pharmacy?: 1 - Never  Diabetic? No  Interpreter Needed?: No  Information entered by :: Orrin Brigham LPN   Activities of Daily Living In your present state of health, do you have any difficulty performing the following activities: 08/27/2021  Hearing? N  Vision? N  Difficulty concentrating or making decisions? N  Walking or climbing stairs? N  Dressing or bathing? N  Doing errands, shopping? N  Preparing Food and eating ? N  Using the Toilet? N  In the past six months, have you accidently leaked urine? N  Do you have problems with loss of bowel control? N  Managing your Medications? N  Managing your Finances? N  Housekeeping or  managing your Housekeeping? N  Some recent data might be hidden    Patient Care Team: Ria Bush, MD as PCP - General (Family Medicine) Lorelee Cover., MD as Consulting Physician (Ophthalmology) Mellissa Kohut, DDS, PA as Consulting Physician (Dentistry)  Indicate any recent Medical Services you may have received from other than [facility] providers in the past year (date may be approximate).     Assessment:   This is a routine wellness examination for Courtney Thomas.  Hearing/Vision screen Hearing Screening - Comments:: No issues Vision Screening - Comments:: Last exam 2 years ago, wears glasses for reading  Dietary issues and exercise activities discussed: Current Exercise Habits: The patient does not participate in regular exercise at present   Goals Addressed             This Visit's Progress    Patient Stated       Would like to exercise more       Depression Screen PHQ 2/9 Scores 08/27/2021 08/26/2020 04/05/2019 03/28/2018 03/21/2018 03/27/2017 03/25/2016  PHQ - 2 Score 0 0 0 0 0 0 1  PHQ- 9 Score - 0 3 0 4 1 -    Fall Risk Fall Risk  08/27/2021 08/26/2020 04/05/2019 03/28/2018 03/27/2017  Falls in the past year? 0 0 0 No No  Number falls in past yr: 0 0 - - -  Comment - - - - -  Injury with Fall? 0 0 - - -  Comment - - - - -  Risk for fall due to : No Fall Risks Medication side effect Medication side effect - -  Follow up Falls prevention discussed Falls evaluation completed;Falls prevention discussed Falls evaluation completed;Falls prevention discussed - -    FALL RISK PREVENTION PERTAINING TO THE HOME:  Any stairs in or around the home? Yes  If so, are there any without handrails? No  Home free of loose throw rugs in walkways, pet beds, electrical cords, etc? Yes  Adequate lighting in your home to reduce risk of falls? Yes   ASSISTIVE DEVICES UTILIZED TO PREVENT FALLS:  Life alert? No  Use of a cane, walker or w/c? No  Grab bars in the bathroom? Yes  Shower chair  or bench in shower? No  Elevated toilet  seat or a handicapped toilet? No   TIMED UP AND GO:  Was the test performed? No .    Cognitive Function: Normal cognitive status assessed by  this Nurse Health Advisor. No abnormalities found.   MMSE - Mini Mental State Exam 08/26/2020 04/05/2019 03/28/2018 03/27/2017 03/25/2016  Orientation to time _0 Orientation to Place _1 Registration _2 Attention/ Calculation 5 5 0 0 0  Recall _3 Recall-comments - - unable to recall 1 of 3 words - -  Language- name 2 objects - 0 0 0 0  Language- repeat _4 Language- follow 3 step command - 0 _5 Language- read & follow direction - 0 0 0 0  Write a sentence - 0 0 0 0  Copy design - 0 0 0 0  Total score - _6 Immunizations Immunization History  Administered Date(s) Administered   Influenza Inj Mdck Quad Pf 04/16/2019, 04/22/2020, 04/20/2021   Influenza, Quadrivalent, Recombinant, Inj, Pf 04/01/2016, 05/09/2017   PFIZER(Purple Top)SARS-COV-2 Vaccination 09/02/2019, 09/23/2019, 05/04/2020   Pneumococcal Conjugate-13 08/27/2015   Pneumococcal Polysaccharide-23 04/05/2017   Tdap 08/02/2011    TDAP status: Due, Education has been provided regarding the importance of this vaccine. Advised may receive this vaccine at local pharmacy or Health Dept. Aware to provide a copy of the vaccination record if obtained from local pharmacy or Health Dept. Verbalized acceptance and understanding.  Flu Vaccine status: Up to date  Pneumococcal vaccine status: Up to date  Covid-19 vaccine status: Information provided on how to obtain vaccines.   Qualifies for Shingles Vaccine? Yes   Zostavax completed No   Shingrix Completed?: No.    Education has been provided regarding the importance of this vaccine. Patient has been advised to call insurance company to determine out of pocket expense if they have not yet received this vaccine. Advised may also receive vaccine  at local pharmacy or Health Dept. Verbalized acceptance and understanding.  Screening Tests Health Maintenance  Topic Date Due   Zoster Vaccines- Shingrix (1 of 2) Never done   COVID-19 Vaccine (4 - Booster for Pfizer series) 06/29/2020   MAMMOGRAM  06/15/2021   TETANUS/TDAP  08/01/2021   COLONOSCOPY (Pts 45-15yr Insurance coverage will need to be confirmed)  08/02/2023   Pneumonia Vaccine 74 Years old  Completed   INFLUENZA VACCINE  Completed   DEXA SCAN  Completed   Hepatitis C Screening  Completed   HPV VACCINES  Aged Out    Health Maintenance  Health Maintenance Due  Topic Date Due   Zoster Vaccines- Shingrix (1 of 2) Never done   COVID-19 Vaccine (4 - Booster for Pfizer series) 06/29/2020   MAMMOGRAM  06/15/2021   TETANUS/TDAP  08/01/2021    Colorectal cancer screening: Type of screening: Colonoscopy. Completed 08/01/13. Repeat every 10 years  Mammogram status: due, patient has a scheduled appointment on 08/30/21  Bone Density status: Completed 06/02/19. Results reflect: Bone density results: OSTEOPOROSIS. Repeat every 2 years.  Lung Cancer Screening: (Low Dose CT Chest recommended if Age 564-80years, 30 pack-year currently smoking OR have quit w/in 15years.) does not qualify.    Additional Screening:  Hepatitis C Screening: does qualify; Completed 03/25/16  Vision Screening: Recommended annual ophthalmology exams for early detection of glaucoma and other disorders of the eye. Is the patient up  to date with their annual eye exam?  No  Who is the provider or what is the name of the office in which the patient attends annual eye exams? Provider information unavailable.    Dental Screening: Recommended annual dental exams for proper oral hygiene  Community Resource Referral / Chronic Care Management: CRR required this visit?  No   CCM required this visit?  No      Plan:     I have personally reviewed and noted the following in the patients chart:   Medical  and social history Use of alcohol, tobacco or illicit drugs  Current medications and supplements including opioid prescriptions.  Functional ability and status Nutritional status Physical activity Advanced directives List of other physicians Hospitalizations, surgeries, and ER visits in previous 12 months Vitals Screenings to include cognitive, depression, and falls Referrals and appointments  In addition, I have reviewed and discussed with patient certain preventive protocols, quality metrics, and best practice recommendations. A written personalized care plan for preventive services as well as general preventive health recommendations were provided to patient.   Due to this being a telephonic visit, the after visit summary with patients personalized plan was offered to patient via mail or my-chart. Patient would like to access on my-chart and Patient will pick up at office at next visit.   Loma Messing, LPN  [id number]   Nurse Health Advisor  Nurse Notes: none

## 2021-08-27 ENCOUNTER — Ambulatory Visit (INDEPENDENT_AMBULATORY_CARE_PROVIDER_SITE_OTHER): Payer: Medicare HMO

## 2021-08-27 VITALS — Ht <= 58 in | Wt 115.0 lb

## 2021-08-27 DIAGNOSIS — Z0000 Encounter for general adult medical examination without abnormal findings: Secondary | ICD-10-CM | POA: Diagnosis not present

## 2021-08-27 NOTE — Patient Instructions (Signed)
Courtney Thomas , Thank you for taking time to complete your Medicare Wellness Visit. I appreciate your ongoing commitment to your health goals. Please review the following plan we discussed and let me know if I can assist you in the future.   Screening recommendations/referrals: Colonoscopy: up to date, completed 08/01/13, due 08/02/23 Mammogram: due, next appointment schedule 08/30/21 Bone Density: due, last completed 06/02/19, ordered placed , you plan to schedule an appointment  Recommended yearly ophthalmology/optometry visit for glaucoma screening and checkup Recommended yearly dental visit for hygiene and checkup  Vaccinations: Influenza vaccine: up to date  Pneumococcal vaccine: up to date Tdap vaccine: due, last completed 08/02/11, discuss with your local pharmacy Shingles vaccine: Discuss with your local pharmacy   Covid-19:newest booster available at your local pharmacy  Advanced directives: Please bring a copy of Living Will and/or Healthcare Power of Attorney for your chart.   Conditions/risks identified: see problem list  Next appointment: Follow up in one year for your annual wellness visit 08/30/22 @ 9:00am, this will be a telephone visit   Preventive Care 74 Years and Older, Female Preventive care refers to lifestyle choices and visits with your health care provider that can promote health and wellness. What does preventive care include? A yearly physical exam. This is also called an annual well check. Dental exams once or twice a year. Routine eye exams. Ask your health care provider how often you should have your eyes checked. Personal lifestyle choices, including: Daily care of your teeth and gums. Regular physical activity. Eating a healthy diet. Avoiding tobacco and drug use. Limiting alcohol use. Practicing safe sex. Taking low-dose aspirin every day. Taking vitamin and mineral supplements as recommended by your health care provider. What happens during an annual well  check? The services and screenings done by your health care provider during your annual well check will depend on your age, overall health, lifestyle risk factors, and family history of disease. Counseling  Your health care provider may ask you questions about your: Alcohol use. Tobacco use. Drug use. Emotional well-being. Home and relationship well-being. Sexual activity. Eating habits. History of falls. Memory and ability to understand (cognition). Work and work Statistician. Reproductive health. Screening  You may have the following tests or measurements: Height, weight, and BMI. Blood pressure. Lipid and cholesterol levels. These may be checked every 5 years, or more frequently if you are over 74 years old. Skin check. Lung cancer screening. You may have this screening every year starting at age 74 if you have a 30-pack-year history of smoking and currently smoke or have quit within the past 15 years. Fecal occult blood test (FOBT) of the stool. You may have this test every year starting at age 74. Flexible sigmoidoscopy or colonoscopy. You may have a sigmoidoscopy every 5 years or a colonoscopy every 10 years starting at age 74. Hepatitis C blood test. Hepatitis B blood test. Sexually transmitted disease (STD) testing. Diabetes screening. This is done by checking your blood sugar (glucose) after you have not eaten for a while (fasting). You may have this done every 1-3 years. Bone density scan. This is done to screen for osteoporosis. You may have this done starting at age 74. Mammogram. This may be done every 1-2 years. Talk to your health care provider about how often you should have regular mammograms. Talk with your health care provider about your test results, treatment options, and if necessary, the need for more tests. Vaccines  Your health care provider may recommend certain vaccines,  such as: Influenza vaccine. This is recommended every year. Tetanus, diphtheria, and  acellular pertussis (Tdap, Td) vaccine. You may need a Td booster every 10 years. Zoster vaccine. You may need this after age 74. Pneumococcal 13-valent conjugate (PCV13) vaccine. One dose is recommended after age 74. Pneumococcal polysaccharide (PPSV23) vaccine. One dose is recommended after age 74. Talk to your health care provider about which screenings and vaccines you need and how often you need them. This information is not intended to replace advice given to you by your health care provider. Make sure you discuss any questions you have with your health care provider. Document Released: 08/14/2015 Document Revised: 04/06/2016 Document Reviewed: 05/19/2015 Elsevier Interactive Patient Education  2017 [place] Prevention in the Home Falls can cause injuries. They can happen to people of all ages. There are many things you can do to make your home safe and to help prevent falls. What can I do on the outside of my home? Regularly fix the edges of walkways and driveways and fix any cracks. Remove anything that might make you trip as you walk through a door, such as a raised step or threshold. Trim any bushes or trees on the path to your home. Use bright outdoor lighting. Clear any walking paths of anything that might make someone trip, such as rocks or tools. Regularly check to see if handrails are loose or broken. Make sure that both sides of any steps have handrails. Any raised decks and porches should have guardrails on the edges. Have any leaves, snow, or ice cleared regularly. Use sand or salt on walking paths during winter. Clean up any spills in your garage right away. This includes oil or grease spills. What can I do in the bathroom? Use night lights. Install grab bars by the toilet and in the tub and shower. Do not use towel bars as grab bars. Use non-skid mats or decals in the tub or shower. If you need to sit down in the shower, use a plastic, non-slip stool. Keep  the floor dry. Clean up any water that spills on the floor as soon as it happens. Remove soap buildup in the tub or shower regularly. Attach bath mats securely with double-sided non-slip rug tape. Do not have throw rugs and other things on the floor that can make you trip. What can I do in the bedroom? Use night lights. Make sure that you have a light by your bed that is easy to reach. Do not use any sheets or blankets that are too big for your bed. They should not hang down onto the floor. Have a firm chair that has side arms. You can use this for support while you get dressed. Do not have throw rugs and other things on the floor that can make you trip. What can I do in the kitchen? Clean up any spills right away. Avoid walking on wet floors. Keep items that you use a lot in easy-to-reach places. If you need to reach something above you, use a strong step stool that has a grab bar. Keep electrical cords out of the way. Do not use floor polish or wax that makes floors slippery. If you must use wax, use non-skid floor wax. Do not have throw rugs and other things on the floor that can make you trip. What can I do with my stairs? Do not leave any items on the stairs. Make sure that there are handrails on both sides of the stairs and  use them. Fix handrails that are broken or loose. Make sure that handrails are as long as the stairways. Check any carpeting to make sure that it is firmly attached to the stairs. Fix any carpet that is loose or worn. Avoid having throw rugs at the top or bottom of the stairs. If you do have throw rugs, attach them to the floor with carpet tape. Make sure that you have a light switch at the top of the stairs and the bottom of the stairs. If you do not have them, ask someone to add them for you. What else can I do to help prevent falls? Wear shoes that: Do not have high heels. Have rubber bottoms. Are comfortable and fit you well. Are closed at the toe. Do not  wear sandals. If you use a stepladder: Make sure that it is fully opened. Do not climb a closed stepladder. Make sure that both sides of the stepladder are locked into place. Ask someone to hold it for you, if possible. Clearly mark and make sure that you can see: Any grab bars or handrails. First and last steps. Where the edge of each step is. Use tools that help you move around (mobility aids) if they are needed. These include: Canes. Walkers. Scooters. Crutches. Turn on the lights when you go into a dark area. Replace any light bulbs as soon as they burn out. Set up your furniture so you have a clear path. Avoid moving your furniture around. If any of your floors are uneven, fix them. If there are any pets around you, be aware of where they are. Review your medicines with your doctor. Some medicines can make you feel dizzy. This can increase your chance of falling. Ask your doctor what other things that you can do to help prevent falls. This information is not intended to replace advice given to you by your health care provider. Make sure you discuss any questions you have with your health care provider. Document Released: 05/14/2009 Document Revised: 12/24/2015 Document Reviewed: 08/22/2014 Elsevier Interactive Patient Education  2017 [employer].

## 2021-08-30 ENCOUNTER — Ambulatory Visit
Admission: RE | Admit: 2021-08-30 | Discharge: 2021-08-30 | Disposition: A | Discharge status: Home / Self Care | Payer: Medicare HMO | Source: Ambulatory Visit | Attending: Family Medicine | Admitting: Family Medicine

## 2021-08-30 ENCOUNTER — Other Ambulatory Visit (INDEPENDENT_AMBULATORY_CARE_PROVIDER_SITE_OTHER): Payer: Medicare HMO

## 2021-08-30 ENCOUNTER — Other Ambulatory Visit: Payer: Self-pay

## 2021-08-30 ENCOUNTER — Other Ambulatory Visit: Payer: Medicare HMO

## 2021-08-30 DIAGNOSIS — E538 Deficiency of other specified B group vitamins: Secondary | ICD-10-CM

## 2021-08-30 DIAGNOSIS — E785 Hyperlipidemia, unspecified: Secondary | ICD-10-CM | POA: Diagnosis not present

## 2021-08-30 DIAGNOSIS — E559 Vitamin D deficiency, unspecified: Secondary | ICD-10-CM

## 2021-08-30 DIAGNOSIS — Z1231 Encounter for screening mammogram for malignant neoplasm of breast: Secondary | ICD-10-CM | POA: Diagnosis not present

## 2021-08-30 LAB — COMPREHENSIVE METABOLIC PANEL
ALT: 14 U/L (ref 0–35)
AST: 7 U/L (ref 0–37)
Albumin: 4.2 g/dL (ref 3.5–5.2)
Alkaline Phosphatase: 75 U/L (ref 39–117)
BUN: 16 mg/dL (ref 6–23)
CO2: 27 mEq/L (ref 19–32)
Calcium: 9.1 mg/dL (ref 8.4–10.5)
Chloride: 104 mEq/L (ref 96–112)
Creatinine, Ser: 0.85 mg/dL (ref 0.40–1.20)
GFR: 67.68 mL/min (ref >60.00)
Glucose, Bld: 87 mg/dL (ref 70–99)
Potassium: 4 mEq/L (ref 3.5–5.1)
Sodium: 141 mEq/L (ref 135–145)
Total Bilirubin: 0.5 mg/dL (ref 0.2–1.2)
Total Protein: 6.5 g/dL (ref 6.0–8.3)

## 2021-08-30 LAB — CBC WITH DIFFERENTIAL/PLATELET
Basophils Absolute: 0.1 10*3/uL (ref 0.0–0.1)
Basophils Relative: 1.2 % (ref 0.0–3.0)
Eosinophils Absolute: 0.2 10*3/uL (ref 0.0–0.7)
Eosinophils Relative: 4.5 % (ref 0.0–5.0)
HCT: 33.2 % — ABNORMAL LOW (ref 36.0–46.0)
Hemoglobin: 10.5 g/dL — ABNORMAL LOW (ref 12.0–15.0)
Lymphocytes Relative: 27 % (ref 12.0–46.0)
Lymphs Abs: 1.3 10*3/uL (ref 0.7–4.0)
MCHC: 31.7 g/dL (ref 30.0–36.0)
MCV: 80 fl (ref 78.0–100.0)
Monocytes Absolute: 0.5 10*3/uL (ref 0.1–1.0)
Monocytes Relative: 9.7 % (ref 3.0–12.0)
Neutro Abs: 2.8 10*3/uL (ref 1.4–7.7)
Neutrophils Relative %: 57.6 % (ref 43.0–77.0)
Platelets: 281 10*3/uL (ref 150.0–400.0)
RBC: 4.14 Mil/uL (ref 3.87–5.11)
RDW: 15.7 % — ABNORMAL HIGH (ref 11.5–15.5)
WBC: 4.9 10*3/uL (ref 4.0–10.5)

## 2021-08-30 LAB — LIPID PANEL
Cholesterol: 152 mg/dL (ref 0–200)
HDL: 65.7 mg/dL (ref >39.00)
LDL Cholesterol: 76 mg/dL (ref 0–99)
NonHDL: 86.3
Total CHOL/HDL Ratio: 2
Triglycerides: 50 mg/dL (ref 0.0–149.0)
VLDL: 10 mg/dL (ref 0.0–40.0)

## 2021-08-30 LAB — TSH: TSH: 1.34 u[IU]/mL (ref 0.35–5.50)

## 2021-08-30 LAB — VITAMIN D 25 HYDROXY (VIT D DEFICIENCY, FRACTURES): VITD: 12.57 ng/mL — ABNORMAL LOW (ref 30.00–100.00)

## 2021-08-30 LAB — VITAMIN B12: Vitamin B-12: 349 pg/mL (ref 211–911)

## 2021-09-06 ENCOUNTER — Encounter: Payer: Medicare HMO | Admitting: Family Medicine

## 2021-09-14 ENCOUNTER — Ambulatory Visit (INDEPENDENT_AMBULATORY_CARE_PROVIDER_SITE_OTHER): Payer: Medicare HMO

## 2021-09-14 ENCOUNTER — Other Ambulatory Visit: Payer: Self-pay

## 2021-09-14 DIAGNOSIS — E538 Deficiency of other specified B group vitamins: Secondary | ICD-10-CM

## 2021-09-14 MED ORDER — CYANOCOBALAMIN 1000 MCG/ML IJ SOLN
1000.0000 ug | Freq: Once | INTRAMUSCULAR | Status: AC
  Administered 2021-09-14: 1000 ug via INTRAMUSCULAR

## 2021-09-14 NOTE — Progress Notes (Signed)
Per orders of Dr. Glori Bickers, in the absence of Dr. Darnell Level, monthly injection of B12, given by Loreen Freud. Patient tolerated injection well.

## 2021-09-28 ENCOUNTER — Telehealth: Payer: Self-pay

## 2021-09-28 DIAGNOSIS — M810 Age-related osteoporosis without current pathological fracture: Secondary | ICD-10-CM

## 2021-09-28 NOTE — Telephone Encounter (Signed)
Benefits submitted-pending Next injection due 10/12/21 or after PA re authorized for dates: 08/01/21-07/31/22

## 2021-09-30 ENCOUNTER — Other Ambulatory Visit: Payer: Self-pay | Admitting: Family Medicine

## 2021-10-08 ENCOUNTER — Other Ambulatory Visit: Payer: Self-pay

## 2021-10-08 ENCOUNTER — Other Ambulatory Visit (INDEPENDENT_AMBULATORY_CARE_PROVIDER_SITE_OTHER): Payer: Medicare HMO

## 2021-10-08 DIAGNOSIS — M810 Age-related osteoporosis without current pathological fracture: Secondary | ICD-10-CM

## 2021-10-08 LAB — BASIC METABOLIC PANEL
BUN: 24 mg/dL (ref 7–25)
CO2: 28 mmol/L (ref 20–32)
Calcium: 9.3 mg/dL (ref 8.6–10.4)
Chloride: 102 mmol/L (ref 98–110)
Creat: 1 mg/dL (ref 0.60–1.00)
Glucose, Bld: 90 mg/dL (ref 65–99)
Potassium: 4.1 mmol/L (ref 3.5–5.3)
Sodium: 138 mmol/L (ref 135–146)

## 2021-10-08 NOTE — Telephone Encounter (Signed)
I've added vit D to blood drawn today. ?If staying low, will need to recheck prior to prolia.  ?Will need to ensure she's taking Rx strength vitamin D weekly.  ?

## 2021-10-08 NOTE — Telephone Encounter (Signed)
Benefits received. ?OOP cost is $295 ?Lab appt 10/08/21 and NV 10/13/21 ? ?Dr Darnell Level I saw patient's Vitamin D was low the last few times, is it ok to proceed with injection still? ?

## 2021-10-08 NOTE — Addendum Note (Signed)
Addended by: Carter Kitten on: 10/08/2021 03:10 PM ? ? Modules accepted: Orders ? ?

## 2021-10-08 NOTE — Addendum Note (Signed)
Addended by: Kris Mouton on: 10/08/2021 12:27 PM ? ? Modules accepted: Orders ? ?

## 2021-10-08 NOTE — Addendum Note (Signed)
Addended by: Ellamae Sia on: 10/08/2021 04:47 PM ? ? Modules accepted: Orders ? ?

## 2021-10-09 ENCOUNTER — Other Ambulatory Visit: Payer: Self-pay | Admitting: Family Medicine

## 2021-10-09 LAB — VITAMIN D 25 HYDROXY (VIT D DEFICIENCY, FRACTURES): Vit D, 25-Hydroxy: 15 ng/mL — ABNORMAL LOW (ref 30–100)

## 2021-10-09 MED ORDER — VITAMIN D3 1.25 MG (50000 UT) PO TABS: 1.0000 | ORAL_TABLET | ORAL | 3 refills | Status: DC

## 2021-10-11 MED ORDER — VITAMIN D3 25 MCG (1000 UT) PO CAPS: 2.0000 | ORAL_CAPSULE | Freq: Every day | ORAL | Status: DC

## 2021-10-11 NOTE — Addendum Note (Signed)
Addended by: Ria Bush on: 10/11/2021 01:29 PM ? ? Modules accepted: Orders ? ?

## 2021-10-11 NOTE — Telephone Encounter (Addendum)
Please have her take vit D3 2000 units daily in addition to her weekly vitamin D3.  ?

## 2021-10-11 NOTE — Telephone Encounter (Signed)
Patient notified as instructed by telephone and verbalized understanding. 

## 2021-10-11 NOTE — Telephone Encounter (Signed)
Patient notified as instructed by telephone and verbalized understanding. ?Patient stated that she is taking her Vitamin D weekly and has not missed any. ?

## 2021-10-12 NOTE — Telephone Encounter (Signed)
CrCl is 40.67 mL/min ?Calcium normal at 9.3 on 10/08/21 ?

## 2021-10-13 ENCOUNTER — Ambulatory Visit: Payer: Medicare HMO

## 2021-10-19 ENCOUNTER — Ambulatory Visit: Payer: Medicare HMO

## 2021-10-20 ENCOUNTER — Other Ambulatory Visit: Payer: Self-pay

## 2021-10-20 ENCOUNTER — Ambulatory Visit (INDEPENDENT_AMBULATORY_CARE_PROVIDER_SITE_OTHER): Payer: Medicare HMO

## 2021-10-20 DIAGNOSIS — E538 Deficiency of other specified B group vitamins: Secondary | ICD-10-CM

## 2021-10-20 MED ORDER — CYANOCOBALAMIN 1000 MCG/ML IJ SOLN
1000.0000 ug | Freq: Once | INTRAMUSCULAR | Status: AC
  Administered 2021-10-20: 1000 ug via INTRAMUSCULAR

## 2021-10-20 NOTE — Progress Notes (Signed)
Pt came in this morning to receive a vit b12  injection pt was given injection IM in the left arm successfully w/o any concerns.  ?

## 2021-10-27 ENCOUNTER — Other Ambulatory Visit: Payer: Self-pay | Admitting: Family Medicine

## 2021-10-27 NOTE — Telephone Encounter (Signed)
ERx 

## 2021-10-27 NOTE — Telephone Encounter (Signed)
Refill request Ambien ?Last office visit video 05/11/21 ?Last refill 04/14/21 #30/3 ?Upcoming appointment 11/30/21 ?

## 2021-11-04 ENCOUNTER — Ambulatory Visit (INDEPENDENT_AMBULATORY_CARE_PROVIDER_SITE_OTHER): Payer: Medicare HMO

## 2021-11-04 ENCOUNTER — Ambulatory Visit
Admission: EM | Admit: 2021-11-04 | Discharge: 2021-11-04 | Disposition: A | Discharge status: Home / Self Care | Payer: Medicare HMO | Attending: Internal Medicine | Admitting: Internal Medicine

## 2021-11-04 DIAGNOSIS — J984 Other disorders of lung: Secondary | ICD-10-CM

## 2021-11-04 DIAGNOSIS — R059 Cough, unspecified: Secondary | ICD-10-CM

## 2021-11-04 DIAGNOSIS — J0100 Acute maxillary sinusitis, unspecified: Secondary | ICD-10-CM

## 2021-11-04 DIAGNOSIS — J439 Emphysema, unspecified: Secondary | ICD-10-CM | POA: Diagnosis not present

## 2021-11-04 DIAGNOSIS — R040 Epistaxis: Secondary | ICD-10-CM | POA: Diagnosis not present

## 2021-11-04 MED ORDER — BUDESONIDE-FORMOTEROL FUMARATE 80-4.5 MCG/ACT IN AERO: 2.0000 | INHALATION_SPRAY | Freq: Two times a day (BID) | RESPIRATORY_TRACT | 0 refills | Status: DC

## 2021-11-04 MED ORDER — LEVOFLOXACIN 500 MG PO TABS: 500.0000 mg | ORAL_TABLET | Freq: Every day | ORAL | 0 refills | Status: DC

## 2021-11-04 MED ORDER — BENZONATATE 200 MG PO CAPS: 200.0000 mg | ORAL_CAPSULE | Freq: Three times a day (TID) | ORAL | 0 refills | Status: DC | PRN

## 2021-11-04 NOTE — ED Triage Notes (Signed)
Patient presents to Urgent Care with complaints of cough, nosebleed x 2 weeks. Treating symptoms with cough drops, robitussin.  ? ?Denies fever, SOB.  ?

## 2021-11-04 NOTE — Discharge Instructions (Addendum)
Your chest xray shows the following: You need to follow up with your primary care doctor n 7-10 days and may need to have a chest cat scan to see what those lung densities are.  ?I will place you on an antibiotic that covers pneumonias and sinus infections  ?EXAM: ?CHEST - 2 VIEW ?  ?COMPARISON:  Chest radiograph 08/02/2021 ?  ?FINDINGS: ?Severe emphysematous changes particularly in the right upper lung. ?Slightly increased densities in the upper lungs near the apices. No ?focal disease in the mid or lower lungs. Heart size is normal. ?Atherosclerotic calcifications at the aortic arch. Trachea is ?midline. No large pleural effusions. No acute bone abnormality. ?  ?IMPRESSION: ?Severe emphysematous changes with slightly increased densities in ?the upper lungs. Difficult to exclude foci of infection in these ?areas. ?  ?  ?Electronically Signed ?  By: Markus Daft M.D. ?  On: 11/04/2021 13:09 ?

## 2021-11-04 NOTE — ED Provider Notes (Signed)
?[facility] ? ? ? ?CSN: [id number] ?Arrival date & time: 11/04/21  1126 ? ? ?  ? ?History   ?Chief Complaint ?Chief Complaint  ?Patient presents with  ? Cough  ? Epistaxis  ? ? ?HPI ?Courtney Thomas is a 76 y.o. female who presents with cough x 2 weeks which is non productive and wakes her up several times at night time. Has been also having nose bleeds provoked with the coughing x 1 weeks. Denies fever, chills or sweats. Has been fatigued. The last time her nose bled was this am at 5.  ? ? ? ?Past Medical History:  ?Diagnosis Date  ? Adjustment disorder with depressed mood   ? Asthma   ? B12 deficiency anemia 2014  ? mild  ? Frequent headaches   ? History of chicken pox   ? Hyperlipidemia   ? Hypertension   ? Insomnia   ? Osteoporosis   ? latest dexa 05/2016 T -3.3 spine (unchanged), -2.0 hip - on fosamax since ~2015  ? Seasonal allergic rhinitis   ? ? ?Patient Active Problem List  ? Diagnosis Date Noted  ? Sore throat 05/11/2021  ? Fever 05/11/2021  ? Body aches 05/11/2021  ? Tick bite of groin 12/09/2020  ? 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 Thoracic aorta atherosclerosis ([place]) 03/17/2018  ? Emphysema of lung ([place]) 03/17/2018  ? Acute cough 03/13/2018  ? History of allergy to eggs 04/01/2016  ? Tremor 12/08/2015  ? Low back pain radiating to left lower extremity 08/06/2015  ? Advanced care planning/counseling discussion 03/17/2015  ? Health care maintenance 03/17/2015  ? Osteoarthritis of lumbar spine 09/08/2014  ? Medicare annual wellness visit, subsequent 02/21/2014  ? Osteoporosis   ? Vitamin D deficiency 02/13/2014  ? Vitamin B12 deficiency   ? Hypertension   ? Hyperlipidemia   ? Insomnia   ? Seasonal allergic rhinitis   ? Adjustment disorder with depressed mood   ? ? ?Past Surgical History:  ?Procedure Laterality Date  ? ABDOMINAL HYSTERECTOMY    ? BREAST CYST ASPIRATION Left 80s  ? benign  ? BREAST EXCISIONAL BIOPSY Right 1971  ? neg  ? COLONOSCOPY   07/2013  ? WNL per patient ([place])  ? dexa  03/2014  ? T score spine -3.5, hip -2.0  ? NASAL SINUS SURGERY    ? deviated septum  ? TONSILLECTOMY  1970  ? TOTAL ABDOMINAL HYSTERECTOMY W/ BILATERAL SALPINGOOPHORECTOMY  1994  ? uterine cyst with heavy bleeding  ? ? ?OB History   ?No obstetric history on file. ?  ? ? ? ?Home Medications   ? ?Prior to Admission medications   ?Medication Sig Start Date End Date Taking? Authorizing Provider  ?budesonide-formoterol (SYMBICORT) 80-4.5 MCG/ACT inhaler Inhale 2 puffs into the lungs 2 (two) times daily. 11/04/21  Yes Rodriguez-Southworth, Sunday Spillers, PA-C  ?levofloxacin (LEVAQUIN) 500 MG tablet Take 1 tablet (500 mg total) by mouth daily. 11/04/21  Yes Rodriguez-Southworth, Sunday Spillers, PA-C  ?zolpidem (AMBIEN) 10 MG tablet TAKE 1/2 TO 1 TABLET AT BEDTIME AS NEEDED FOR SLEEP 10/27/21   Ria Bush, MD  ?albuterol (VENTOLIN HFA) 108 (90 Base) MCG/ACT inhaler Inhale 2 puffs into the lungs every 6 (six) hours as needed for wheezing or shortness of breath. 09/01/20   Ria Bush, MD  ?atorvastatin (LIPITOR) 10 MG tablet TAKE 1 TABLET BY MOUTH DAILY 10/01/21   Ria Bush, MD  ?benzonatate (TESSALON) 200 MG capsule Take 1 capsule (200 mg total) by  mouth 3 (three) times daily as needed for cough. 11/04/21   Rodriguez-Southworth, Sunday Spillers, PA-C  ?buPROPion [facility] SR) 150 MG 12 hr tablet TAKE 1 TABLET BY MOUTH DAILY 08/17/21   Ria Bush, MD  ?cephALEXin (KEFLEX) 500 MG capsule Take 1 capsule (500 mg total) by mouth 2 (two) times daily. ?Patient not taking: Reported on 08/27/2021 08/02/21   Delman Kitten, MD  ?chlorpheniramine-HYDROcodone [facility]) 10-8 MG/5ML SUER Take 5 mLs by mouth every 12 (twelve) hours as needed for cough. 05/11/21   Melynda Ripple, MD  ?Cholecalciferol (VITAMIN D3) 1.25 MG (50000 UT) TABS Take 1 tablet by mouth once a week. 10/09/21   Ria Bush, MD  ?Cholecalciferol (VITAMIN D3) 25 MCG (1000 UT) CAPS Take 2 capsules (2,000 Units total)  by mouth daily. 10/11/21   Ria Bush, MD  ?DULoxetine (CYMBALTA) 60 MG capsule Take 1 capsule (60 mg total) by mouth daily. 09/01/20   Ria Bush, MD  ?fluticasone [facility] 50 MCG/ACT nasal spray Place 2 sprays into both nostrils daily. 05/11/21   Melynda Ripple, MD  ?lisinopril-hydrochlorothiazide (ZESTORETIC) 10-12.5 MG tablet TAKE 1 TABLET BY MOUTH DAILY 08/17/21   Ria Bush, MD  ?ondansetron (ZOFRAN-ODT) 4 MG disintegrating tablet Take 1 tablet (4 mg total) by mouth every 6 (six) hours as needed for nausea or vomiting. 08/02/21   Delman Kitten, MD  ?potassium chloride (KLOR-CON) 10 MEQ tablet Take 1 tablet (10 mEq total) by mouth daily. 08/02/21   Delman Kitten, MD  ? ? ?Family History ?Family History  ?Problem Relation Age of Onset  ? Cancer Mother 8  ?     lung met to brain, smoker  ? Stroke Mother   ? Alzheimer's disease Mother   ? Arthritis Father   ? Arthritis Sister   ? Cancer Brother 49  ?     pancreatic  ? Diabetes Maternal Grandmother   ? Breast cancer Neg Hx   ? ? ?Social History ?Social History  ? ?Tobacco Use  ? Smoking status: Former  ? Smokeless tobacco: Never  ? Tobacco comments:  ?  minimal smoking as a teenager  ?Vaping Use  ? Vaping Use: Never used  ?Substance Use Topics  ? Alcohol use: No  ? Drug use: No  ? ? ? ?Allergies   ?Eggs or egg-derived products, Codeine, and Penicillins ? ? ?Review of Systems ?Review of Systems  ?Constitutional:  Positive for fatigue. Negative for appetite change, chills, diaphoresis and fever.  ?HENT:  Positive for nosebleeds and postnasal drip. Negative for congestion, ear discharge, ear pain and sore throat.   ?Eyes:  Negative for discharge.  ?Respiratory:  Positive for cough. Negative for chest tightness and shortness of breath.   ?Cardiovascular:  Negative for chest pain.  ?Musculoskeletal:  Negative for myalgias.  ?Neurological:  Negative for headaches.  ?Hematological:  Negative for adenopathy.  ? ? ?Physical Exam ?Triage Vital Signs ?ED Triage  Vitals  ?Enc Vitals Group  ?   BP 11/04/21 1243 124/63  ?   Pulse Rate 11/04/21 1243 79  ?   Resp 11/04/21 1243 16  ?   Temp 11/04/21 1243 98.7 ?F (37.1 ?C)  ?   Temp Source 11/04/21 1243 Oral  ?   SpO2 11/04/21 1243 96 %  ?   Weight --   ?   Height --   ?   Head Circumference --   ?   Peak Flow --   ?   Pain Score 11/04/21 1246 0  ?   Pain  Loc --   ?   Pain Edu? --   ?   Excl. in [place]? --   ? ?No data found. ? ?Updated Vital Signs ?BP 124/63 (BP Location: Left Arm)   Pulse 79   Temp 98.7 ?F (37.1 ?C) (Oral)   Resp 16   SpO2 96%  ? ?Visual Acuity ?Right Eye Distance:   ?Left Eye Distance:   ?Bilateral Distance:   ? ?Right Eye Near:   ?Left Eye Near:    ?Bilateral Near:    ? ? ?Physical Exam ?Vitals signs and nursing note reviewed.  ?Constitutional:   ?   General: She is not in acute distress. ?   Appearance: Normal appearance. She is not ill-appearing, toxic-appearing or diaphoretic.  ?HENT:  ?   Head: Normocephalic.  ?   Right Ear: Tympanic membrane, ear canal and external ear normal.  ?   Left Ear: Tympanic membrane, ear canal and external ear normal.  ?   Nose: Nose normal. Has tenderness on maxillary sinuses bilaterally.  ?   Mouth/Throat: clear ?   Mouth: Mucous membranes are moist.  ?Eyes:  ?   General: No scleral icterus.    ?   Right eye: No discharge.     ?   Left eye: No discharge.  ?   Conjunctiva/sclera: Conjunctivae normal.  ?Neck:  ?   Musculoskeletal: Neck supple. No neck rigidity.  ?Cardiovascular:  ?   Rate and Rhythm: Normal rate and regular rhythm.  ?   Heart sounds: No murmur.  ?Pulmonary:  ?   Effort: Pulmonary effort is poor  ?   Breath sounds: has decreased breath sounds ALL OVER.  ?Musculoskeletal: Normal range of motion.  ?Lymphadenopathy:  ?   Cervical: No cervical adenopathy.  ?Skin: ?   General: Skin is warm and dry.  ?   Coloration: Skin is not jaundiced.  ?   Findings: No rash.  ?Neurological:  ?   Mental Status: She is alert and oriented to person, place, and time.  ?   Gait: Gait  normal.  ?Psychiatric:     ?   Mood and Affect: Mood normal.     ?   Behavior: Behavior normal.     ?   Thought Content: Thought content normal.     ?   Judgment: Judgment normal.  ? ? ?UC Treatments / Resu

## 2021-11-07 ENCOUNTER — Other Ambulatory Visit: Payer: Self-pay | Admitting: Family Medicine

## 2021-11-07 DIAGNOSIS — I10 Essential (primary) hypertension: Secondary | ICD-10-CM

## 2021-11-07 DIAGNOSIS — E559 Vitamin D deficiency, unspecified: Secondary | ICD-10-CM

## 2021-11-09 ENCOUNTER — Other Ambulatory Visit: Payer: Self-pay | Admitting: Family Medicine

## 2021-11-09 ENCOUNTER — Other Ambulatory Visit (INDEPENDENT_AMBULATORY_CARE_PROVIDER_SITE_OTHER): Payer: Medicare HMO

## 2021-11-09 DIAGNOSIS — I10 Essential (primary) hypertension: Secondary | ICD-10-CM

## 2021-11-09 DIAGNOSIS — E559 Vitamin D deficiency, unspecified: Secondary | ICD-10-CM | POA: Diagnosis not present

## 2021-11-09 LAB — BASIC METABOLIC PANEL
BUN: 24 mg/dL — ABNORMAL HIGH (ref 6–23)
CO2: 29 mEq/L (ref 19–32)
Calcium: 9.2 mg/dL (ref 8.4–10.5)
Chloride: 103 mEq/L (ref 96–112)
Creatinine, Ser: 0.88 mg/dL (ref 0.40–1.20)
GFR: 64.83 mL/min (ref >60.00)
Glucose, Bld: 86 mg/dL (ref 70–99)
Potassium: 3.9 mEq/L (ref 3.5–5.1)
Sodium: 141 mEq/L (ref 135–145)

## 2021-11-09 LAB — VITAMIN D 25 HYDROXY (VIT D DEFICIENCY, FRACTURES): VITD: 46.83 ng/mL (ref 30.00–100.00)

## 2021-11-16 NOTE — Telephone Encounter (Signed)
Labs re done due to low Vit D. Ok to proceed now. See lab result note. ?NV 11/24/21 ? ?Labs done on 11/09/21 ?CrCl is 46.22 mL/min ?Calcium 9.2 ?

## 2021-11-24 ENCOUNTER — Ambulatory Visit (INDEPENDENT_AMBULATORY_CARE_PROVIDER_SITE_OTHER): Payer: Medicare HMO

## 2021-11-24 DIAGNOSIS — E538 Deficiency of other specified B group vitamins: Secondary | ICD-10-CM | POA: Diagnosis not present

## 2021-11-24 DIAGNOSIS — M810 Age-related osteoporosis without current pathological fracture: Secondary | ICD-10-CM | POA: Diagnosis not present

## 2021-11-24 DIAGNOSIS — E559 Vitamin D deficiency, unspecified: Secondary | ICD-10-CM

## 2021-11-24 MED ORDER — CYANOCOBALAMIN 1000 MCG/ML IJ SOLN
1000.0000 ug | Freq: Once | INTRAMUSCULAR | Status: AC
  Administered 2021-11-24: 1000 ug via INTRAMUSCULAR

## 2021-11-24 MED ORDER — DENOSUMAB 60 MG/ML ~~LOC~~ SOSY
60.0000 mg | PREFILLED_SYRINGE | Freq: Once | SUBCUTANEOUS | Status: AC
  Administered 2021-11-24: 60 mg via SUBCUTANEOUS

## 2021-11-24 NOTE — Progress Notes (Signed)
Pt came in to receive a Vit B12 and Prolia injection , pt was given in  prolia  injection the back the left arm successfully and given the Vit b12 injection on rt deltoid IM  without and concerns, pt will set an appt to come back in 6 month for Prolia injection. ?

## 2021-11-30 ENCOUNTER — Ambulatory Visit (INDEPENDENT_AMBULATORY_CARE_PROVIDER_SITE_OTHER): Payer: Medicare HMO | Admitting: Family Medicine

## 2021-11-30 ENCOUNTER — Encounter: Payer: Self-pay | Admitting: Family Medicine

## 2021-11-30 VITALS — BP 110/58 | HR 85 | Temp 98.4°F | Ht <= 58 in | Wt 118.5 lb

## 2021-11-30 DIAGNOSIS — E785 Hyperlipidemia, unspecified: Secondary | ICD-10-CM | POA: Diagnosis not present

## 2021-11-30 DIAGNOSIS — D509 Iron deficiency anemia, unspecified: Secondary | ICD-10-CM | POA: Insufficient documentation

## 2021-11-30 DIAGNOSIS — M810 Age-related osteoporosis without current pathological fracture: Secondary | ICD-10-CM

## 2021-11-30 DIAGNOSIS — Z7189 Other specified counseling: Secondary | ICD-10-CM | POA: Diagnosis not present

## 2021-11-30 DIAGNOSIS — E559 Vitamin D deficiency, unspecified: Secondary | ICD-10-CM | POA: Diagnosis not present

## 2021-11-30 DIAGNOSIS — F4321 Adjustment disorder with depressed mood: Secondary | ICD-10-CM | POA: Diagnosis not present

## 2021-11-30 DIAGNOSIS — D649 Anemia, unspecified: Secondary | ICD-10-CM

## 2021-11-30 DIAGNOSIS — H547 Unspecified visual loss: Secondary | ICD-10-CM

## 2021-11-30 DIAGNOSIS — E538 Deficiency of other specified B group vitamins: Secondary | ICD-10-CM | POA: Diagnosis not present

## 2021-11-30 DIAGNOSIS — J439 Emphysema, unspecified: Secondary | ICD-10-CM

## 2021-11-30 DIAGNOSIS — I10 Essential (primary) hypertension: Secondary | ICD-10-CM | POA: Diagnosis not present

## 2021-11-30 DIAGNOSIS — Z1211 Encounter for screening for malignant neoplasm of colon: Secondary | ICD-10-CM | POA: Diagnosis not present

## 2021-11-30 DIAGNOSIS — I700 Atherosclerosis of aorta: Secondary | ICD-10-CM

## 2021-11-30 DIAGNOSIS — Z0000 Encounter for general adult medical examination without abnormal findings: Secondary | ICD-10-CM | POA: Diagnosis not present

## 2021-11-30 MED ORDER — DULOXETINE HCL 60 MG PO CPEP: 60.0000 mg | ORAL_CAPSULE | Freq: Every day | ORAL | 3 refills | Status: DC

## 2021-11-30 MED ORDER — ATORVASTATIN CALCIUM 10 MG PO TABS: 10.0000 mg | ORAL_TABLET | Freq: Every day | ORAL | 3 refills | Status: DC

## 2021-11-30 MED ORDER — BUPROPION HCL ER (SR) 150 MG PO TB12: 150.0000 mg | ORAL_TABLET | Freq: Every day | ORAL | 3 refills | Status: DC

## 2021-11-30 MED ORDER — BUDESONIDE-FORMOTEROL FUMARATE 80-4.5 MCG/ACT IN AERO: 2.0000 | INHALATION_SPRAY | Freq: Two times a day (BID) | RESPIRATORY_TRACT | 11 refills | Status: DC

## 2021-11-30 MED ORDER — LISINOPRIL-HYDROCHLOROTHIAZIDE 10-12.5 MG PO TABS: 1.0000 | ORAL_TABLET | Freq: Every day | ORAL | 3 refills | Status: DC

## 2021-11-30 NOTE — Assessment & Plan Note (Signed)
Stable period on cymbalta '60mg'$  and wellbutrin SR '150mg'$  daily. Previously driven by high stress job, now that she has retired, discussed possible taper. No changes made today.  ?

## 2021-11-30 NOTE — Assessment & Plan Note (Signed)
Severe by imaging.  ?She's been off controller medication. ?Discussed rescue vs controller medication - will refill symbicort 1 yr supply.  ?Consider updated spirometry at 6 mo f/u visit.  ?

## 2021-11-30 NOTE — Progress Notes (Signed)
? ? Patient ID: Courtney Thomas, female    DOB: 1946-01-08, 76 y.o.   MRN: [id number] ? ?This visit was conducted in person. ? ?BP (!) 110/58   Pulse 85   Temp 98.4 ?F (36.9 ?C) (Temporal)   Ht '4\' 10"'$  (1.473 m)   Wt 118 lb 8 oz (53.8 kg)   SpO2 97%   BMI 24.77 kg/m?   ? ?CC: CPE ?Subjective:  ? ?HPI: ?MAHOGANY TORRANCE is a 76 y.o. female presenting on 11/30/2021 for Medicare Wellness ? ? ?Saw health advisor 08/2021. Note reviewed. No cognitive assessment performed.  ? ?Hearing Screening  ? '250Hz'$  '500Hz'$  '1000Hz'$  '2000Hz'$  '4000Hz'$   ?Right ear 40 40 40 40 0  ?Left ear 25 40 0 0 0  ? ?Vision Screening  ? Right eye Left eye Both eyes  ?Without correction '20/70 20/40 20/30 '$  ?With correction     ?  ?Flowsheet Row Clinical Support from 08/27/2021 in [place] [place]  ?PHQ-2 Total Score 0  ? ?  ?  ? ?  11/30/2021  ?  4:07 PM 08/27/2021  ?  8:58 AM 08/26/2020  ?  9:13 AM 04/05/2019  ? 10:40 AM 03/28/2018  ?  8:10 AM  ?Fall Risk   ?Falls in the past year? 0 0 0 0 No  ?Number falls in past yr: 0 0 0    ?Injury with Fall?  0 0    ?Risk for fall due to :  No Fall Risks Medication side effect Medication side effect   ?Follow up  Falls prevention discussed Falls evaluation completed;Falls prevention discussed Falls evaluation completed;Falls prevention discussed   ? ?Fully retired 2 months ago.  ?Continues wellbutrin SR '150mg'$  daily and cymbalta '60mg'$  daily.  ? ?Preventative: ?Colonoscopy - 07/2013 WNL 10 yr f/u ([place]) ?Well woman - with GYN, last pap smear 10/20/2016 WNL. S/p hysterectomy and BSO.  ?Mammogram 08/2021 @ [facility]  ?DEXA 06/2019 T -2 hip, -3.4 forearm. 05/2016 T -3.5 --> 3.3 spine, -2.0 --> -1.7 hip, on fosamax from <03/2014 through 10-20-20, prolia started then, last injection 10/2021.  ?Egg free flu shot yearly.  ?[place] 09/2019 x2, 05/2020 booster ?Prevnar-13 10/21/15. Pneumovax 10/20/16.  ?Tdap 2011/10/21 with bad reaction  ?Shingrix - discussed, to consider ?Advanced directives: has at home. HCPOA is daughter [name]. To  bring me copy of living will.  ?Seat belt use discussed ?Sunscreen use discussed. No changing moles on skin. Seeing derm.  ?Non smoker, some second hand smoker ?Alcohol - none  ?Dentist Q6 mo ?Eye exam tries yearly - due ?Bowel - no constipation ?Bladder - no incontinence ?  ?Lives alone - husband passed away 10/20/2013. LBD + parkinson disease ?Started seeing new partner 10-20-16.  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   ? ?Relevant past medical, surgical, family and social history reviewed and updated as indicated. Interim medical history since our last visit reviewed. ?Allergies and medications reviewed and updated. ?Outpatient Medications Prior to Visit  ?Medication Sig Dispense Refill  ? albuterol (VENTOLIN HFA) 108 (90 Base) MCG/ACT inhaler Inhale 2 puffs into the lungs every 6 (six) hours as needed for wheezing or shortness of breath. 16 g 3  ? benzonatate (TESSALON) 200 MG capsule Take 1 capsule (200 mg total) by mouth 3 (three) times daily as needed for cough. 30 capsule 0  ? chlorpheniramine-HYDROcodone (TUSSIONEX PENNKINETIC ER) 10-8 MG/5ML SUER  Take 5 mLs by mouth every 12 (twelve) hours as needed for cough. 60 mL 0  ? Cholecalciferol (VITAMIN D3) 1.25 MG (50000 UT) TABS Take 1 tablet by mouth once a week. 12 tablet 3  ? Cholecalciferol (VITAMIN D3) 25 MCG (1000 UT) CAPS Take 2 capsules (2,000 Units total) by mouth daily. 30 capsule   ? fluticasone (FLONASE) 50 MCG/ACT nasal spray Place 2 sprays into both nostrils daily. 16 g 0  ? zolpidem (AMBIEN) 10 MG tablet TAKE 1/2 TO 1 TABLET AT BEDTIME AS NEEDED FOR SLEEP 30 tablet 0  ? atorvastatin (LIPITOR) 10 MG tablet TAKE 1 TABLET BY MOUTH DAILY 90 tablet 0  ? budesonide-formoterol (SYMBICORT) 80-4.5 MCG/ACT inhaler Inhale 2 puffs into the lungs 2 (two) times daily. 1 each 0  ? buPROPion (WELLBUTRIN SR) 150 MG 12 hr tablet TAKE 1 TABLET BY MOUTH DAILY  90 tablet 0  ? DULoxetine (CYMBALTA) 60 MG capsule TAKE 1 CAPSULE BY MOUTH ONCE DAILY 90 capsule 0  ? levofloxacin (LEVAQUIN) 500 MG tablet Take 1 tablet (500 mg total) by mouth daily. 7 tablet 0  ? lisinopril-hydrochlorothiazide (ZESTORETIC) 10-12.5 MG tablet TAKE 1 TABLET BY MOUTH DAILY 90 tablet 0  ? potassium chloride (KLOR-CON) 10 MEQ tablet Take 1 tablet (10 mEq total) by mouth daily. 5 tablet 0  ? cephALEXin (KEFLEX) 500 MG capsule Take 1 capsule (500 mg total) by mouth 2 (two) times daily. (Patient not taking: Reported on 08/27/2021) 14 capsule 0  ? ondansetron (ZOFRAN-ODT) 4 MG disintegrating tablet Take 1 tablet (4 mg total) by mouth every 6 (six) hours as needed for nausea or vomiting. 20 tablet 0  ? ?No facility-administered medications prior to visit.  ?  ? ?Per HPI unless specifically indicated in ROS section below ?Review of Systems  ?Constitutional:  Negative for activity change, appetite change, chills, fatigue, fever and unexpected weight change.  ?HENT:  Negative for hearing loss.   ?Eyes:  Negative for visual disturbance.  ?Respiratory:  Positive for cough, chest tightness and shortness of breath. Negative for wheezing.   ?     Asthma related  ?Cardiovascular:  Negative for chest pain, palpitations and leg swelling.  ?Gastrointestinal:  Negative for abdominal distention, abdominal pain, blood in stool, constipation, diarrhea, nausea and vomiting.  ?Genitourinary:  Negative for difficulty urinating and hematuria.  ?Musculoskeletal:  Negative for arthralgias, myalgias and neck pain.  ?Skin:  Negative for rash.  ?Neurological:  Negative for dizziness, seizures, syncope and headaches.  ?Hematological:  Negative for adenopathy. Does not bruise/bleed easily.  ?Psychiatric/Behavioral:  Negative for dysphoric mood. The patient is not nervous/anxious.   ? ?Objective:  ?BP (!) 110/58   Pulse 85   Temp 98.4 ?F (36.9 ?C) (Temporal)   Ht '4\' 10"'$  (1.473 m)   Wt 118 lb 8 oz (53.8 kg)   SpO2 97%   BMI 24.77  kg/m?   ?Wt Readings from Last 3 Encounters:  ?11/30/21 118 lb 8 oz (53.8 kg)  ?08/27/21 115 lb (52.2 kg)  ?08/02/21 115 lb (52.2 kg)  ?  ?  ?Physical Exam ?Vitals and nursing note reviewed.  ?Constitutional:   ?   Appearance: Normal appearance. She is not ill-appearing.  ?HENT:  ?   Head: Normocephalic and atraumatic.  ?   Right Ear: Tympanic membrane, ear canal and external ear normal. There is no impacted cerumen.  ?   Left Ear: Tympanic membrane, ear canal and external ear normal. There is no impacted cerumen.  ?Eyes:  ?  General:     ?   Right eye: No discharge.     ?   Left eye: No discharge.  ?   Extraocular Movements: Extraocular movements intact.  ?   Conjunctiva/sclera: Conjunctivae normal.  ?   Pupils: Pupils are equal, round, and reactive to light.  ?Neck:  ?   Thyroid: No thyroid mass or thyromegaly.  ?   Vascular: No carotid bruit.  ?Cardiovascular:  ?   Rate and Rhythm: Normal rate and regular rhythm.  ?   Pulses: Normal pulses.  ?   Heart sounds: Normal heart sounds. No murmur heard. ?Pulmonary:  ?   Effort: Pulmonary effort is normal. No respiratory distress.  ?   Breath sounds: Normal breath sounds. No wheezing, rhonchi or rales.  ?   Comments: Coarse breath sounds ?Abdominal:  ?   General: Bowel sounds are normal. There is no distension.  ?   Palpations: Abdomen is soft. There is no mass.  ?   Tenderness: There is no abdominal tenderness. There is no guarding or rebound.  ?   Hernia: No hernia is present.  ?Musculoskeletal:  ?   Cervical back: Normal range of motion and neck supple. No rigidity.  ?   Right lower leg: No edema.  ?   Left lower leg: No edema.  ?Lymphadenopathy:  ?   Cervical: No cervical adenopathy.  ?Skin: ?   General: Skin is warm and dry.  ?   Findings: No rash.  ?Neurological:  ?   General: No focal deficit present.  ?   Mental Status: She is alert. Mental status is at baseline.  ?   Comments:  ?Recall 3/3 ?Calculation 5/5 DLROW  ?Psychiatric:     ?   Mood and Affect: Mood  normal.     ?   Behavior: Behavior normal.  ? ?   ?Results for orders placed or performed in visit on 11/09/21  ?Basic metabolic panel  ?Result Value Ref Range  ? Sodium 141 135 - 145 mEq/L  ? Potassium 3.9 3.5 - 5.1 mEq

## 2021-11-30 NOTE — Assessment & Plan Note (Signed)
On fosamax for 7 yrs through early 2022. ?Prolia started 09/2020, continues Q6 mo injection latest 10/2021.  ?Update DEXA ?

## 2021-11-30 NOTE — Assessment & Plan Note (Signed)
Chronic, stable on lisinopril hctz 10/12.'5mg'$  daily.  ?

## 2021-11-30 NOTE — Assessment & Plan Note (Signed)
Continue low dose statin.  

## 2021-11-30 NOTE — Assessment & Plan Note (Signed)
Newly noted.  ?Send iFOB.  ?Update levels next labwork.  ?

## 2021-11-30 NOTE — Assessment & Plan Note (Signed)
Encouraged she schedule eye exam as due.  ?Possible cataracts.  ?

## 2021-11-30 NOTE — Assessment & Plan Note (Signed)
Preventative protocols reviewed and updated unless pt declined. Discussed healthy diet and lifestyle.  

## 2021-11-30 NOTE — Patient Instructions (Addendum)
Schedule eye exam.  ?Call to schedule bone density scan at your convenience: [facility] at [facility] [phone] ?If interested, check with pharmacy about new 2 shot shingles series (shingrix).  ?Bring [place] a copy of your living will.  ?Restart symbicort daily controller breathing medicine.  ?Return in 6 months for follow up on breathing, sooner if needed.  ?Pass by lab for stool kit.  ? ?Health Maintenance After Age 17 ?After age 75, you are at a higher risk for certain long-term diseases and infections as well as injuries from falls. Falls are a major cause of broken bones and head injuries in people who are older than age 77. Getting regular preventive care can help to keep you healthy and well. Preventive care includes getting regular testing and making lifestyle changes as recommended by your health care provider. Talk with your health care provider about: ?Which screenings and tests you should have. A screening is a test that checks for a disease when you have no symptoms. ?A diet and exercise plan that is right for you. ?What should I know about screenings and tests to prevent falls? ?Screening and testing are the best ways to find a health problem early. Early diagnosis and treatment give you the best chance of managing medical conditions that are common after age 1. Certain conditions and lifestyle choices may make you more likely to have a fall. Your health care provider may recommend: ?Regular vision checks. Poor vision and conditions such as cataracts can make you more likely to have a fall. If you wear glasses, make sure to get your prescription updated if your vision changes. ?Medicine review. Work with your health care provider to regularly review all of the medicines you are taking, including over-the-counter medicines. Ask your health care provider about any side effects that may make you more likely to have a fall. Tell your health care provider if any medicines that you take make you feel  dizzy or sleepy. ?Strength and balance checks. Your health care provider may recommend certain tests to check your strength and balance while standing, walking, or changing positions. ?Foot health exam. Foot pain and numbness, as well as not wearing proper footwear, can make you more likely to have a fall. ?Screenings, including: ?Osteoporosis screening. Osteoporosis is a condition that causes the bones to get weaker and break more easily. ?Blood pressure screening. Blood pressure changes and medicines to control blood pressure can make you feel dizzy. ?Depression screening. You may be more likely to have a fall if you have a fear of falling, feel depressed, or feel unable to do activities that you used to do. ?Alcohol use screening. Using too much alcohol can affect your balance and may make you more likely to have a fall. ?Follow these instructions at home: ?Lifestyle ?Do not drink alcohol if: ?Your health care provider tells you not to drink. ?If you drink alcohol: ?Limit how much you have to: ?0-1 drink a day for women. ?0-2 drinks a day for men. ?Know how much alcohol is in your drink. In the [place] one drink equals one 12 oz bottle of beer (355 mL), one 5 oz glass of wine (148 mL), or one 1? oz glass of hard liquor (44 mL). ?Do not use any products that contain nicotine or tobacco. These products include cigarettes, chewing tobacco, and vaping devices, such as e-cigarettes. If you need help quitting, ask your health care provider. ?Activity ? ?Follow a regular exercise program to stay fit. This will help  you maintain your balance. Ask your health care provider what types of exercise are appropriate for you. ?If you need a cane or walker, use it as recommended by your health care provider. ?Wear supportive shoes that have nonskid soles. ?Safety ? ?Remove any tripping hazards, such as rugs, cords, and clutter. ?Install safety equipment such as grab bars in bathrooms and safety rails on stairs. ?Keep rooms and  walkways well-lit. ?General instructions ?Talk with your health care provider about your risks for falling. Tell your health care provider if: ?You fall. Be sure to tell your health care provider about all falls, even ones that seem minor. ?You feel dizzy, tiredness (fatigue), or off-balance. ?Take over-the-counter and prescription medicines only as told by your health care provider. These include supplements. ?Eat a healthy diet and maintain a healthy weight. A healthy diet includes low-fat dairy products, low-fat (lean) meats, and fiber from whole grains, beans, and lots of fruits and vegetables. ?Stay current with your vaccines. ?Schedule regular health, dental, and eye exams. ?Summary ?Having a healthy lifestyle and getting preventive care can help to protect your health and wellness after age 25. ?Screening and testing are the best way to find a health problem early and help you avoid having a fall. Early diagnosis and treatment give you the best chance for managing medical conditions that are more common for people who are older than age 79. ?Falls are a major cause of broken bones and head injuries in people who are older than age 70. Take precautions to prevent a fall at home. ?Work with your health care provider to learn what changes you can make to improve your health and wellness and to prevent falls. ?This information is not intended to replace advice given to you by your health care provider. Make sure you discuss any questions you have with your health care provider. ?Document Revised: 12/07/2020 Document Reviewed: 12/07/2020 ?Elsevier Patient Education ? [place]. ? ?

## 2021-11-30 NOTE — Assessment & Plan Note (Signed)
Levels stable off replacement - continue to monitor.  ?

## 2021-11-30 NOTE — Assessment & Plan Note (Signed)
Chronic, stable on low dose lipitor - reviewed labs from 08/2021. ?The 10-year ASCVD risk score (Arnett DK, et al., 2019) is: 13.8% ?  Values used to calculate the score: ?    Age: 76 years ?    Sex: Female ?    Is Non-Hispanic African American: No ?    Diabetic: No ?    Tobacco smoker: No ?    Systolic Blood Pressure: 509 mmHg ?    Is BP treated: Yes ?    HDL Cholesterol: 65.7 mg/dL ?    Total Cholesterol: 152 mg/dL  ?

## 2021-11-30 NOTE — Assessment & Plan Note (Addendum)
Levels improving on current regimen of weekly 50k units + daily 1000 IU - continue.  ?

## 2021-11-30 NOTE — Assessment & Plan Note (Signed)
Advanced directives: has at home. HCPOA is daughter Courtney Thomas. To bring me copy of living will.  ?

## 2021-12-09 ENCOUNTER — Other Ambulatory Visit (INDEPENDENT_AMBULATORY_CARE_PROVIDER_SITE_OTHER): Payer: Medicare HMO

## 2021-12-09 DIAGNOSIS — Z1211 Encounter for screening for malignant neoplasm of colon: Secondary | ICD-10-CM | POA: Diagnosis not present

## 2021-12-10 LAB — FECAL OCCULT BLOOD, IMMUNOCHEMICAL: Fecal Occult Bld: NEGATIVE

## 2021-12-20 ENCOUNTER — Ambulatory Visit
Admission: RE | Admit: 2021-12-20 | Discharge: 2021-12-20 | Disposition: A | Discharge status: Home / Self Care | Payer: Medicare HMO | Source: Ambulatory Visit | Attending: Family Medicine | Admitting: Family Medicine

## 2021-12-20 DIAGNOSIS — M85852 Other specified disorders of bone density and structure, left thigh: Secondary | ICD-10-CM | POA: Diagnosis not present

## 2021-12-20 DIAGNOSIS — M810 Age-related osteoporosis without current pathological fracture: Secondary | ICD-10-CM | POA: Insufficient documentation

## 2021-12-20 DIAGNOSIS — Z780 Asymptomatic menopausal state: Secondary | ICD-10-CM | POA: Diagnosis not present

## 2021-12-28 ENCOUNTER — Other Ambulatory Visit: Payer: Self-pay | Admitting: Family Medicine

## 2021-12-28 NOTE — Telephone Encounter (Signed)
Name of Medication: Ambien Name of Pharmacy: [facility] or Written Date and Quantity: 10/28/21, #30 Last Office Visit and Type: 11/30/21, CPE Next Office Visit and Type: none Last Controlled Substance Agreement Date: none Last UDS: none

## 2021-12-29 ENCOUNTER — Ambulatory Visit (INDEPENDENT_AMBULATORY_CARE_PROVIDER_SITE_OTHER): Payer: Medicare HMO

## 2021-12-29 DIAGNOSIS — E538 Deficiency of other specified B group vitamins: Secondary | ICD-10-CM

## 2021-12-29 MED ORDER — CYANOCOBALAMIN 1000 MCG/ML IJ SOLN
1000.0000 ug | Freq: Once | INTRAMUSCULAR | Status: AC
  Administered 2021-12-29: 1000 ug via INTRAMUSCULAR

## 2021-12-29 NOTE — Progress Notes (Signed)
Per orders of Dr. Javier Gutierrez, injection of B-12 given by Chizaram Latino in left deltoid. Patient tolerated injection well.    

## 2021-12-29 NOTE — Telephone Encounter (Signed)
ERx 

## 2022-01-05 DIAGNOSIS — D225 Melanocytic nevi of trunk: Secondary | ICD-10-CM | POA: Diagnosis not present

## 2022-01-05 DIAGNOSIS — L570 Actinic keratosis: Secondary | ICD-10-CM | POA: Diagnosis not present

## 2022-01-05 DIAGNOSIS — Z85828 Personal history of other malignant neoplasm of skin: Secondary | ICD-10-CM | POA: Diagnosis not present

## 2022-01-05 DIAGNOSIS — D2261 Melanocytic nevi of right upper limb, including shoulder: Secondary | ICD-10-CM | POA: Diagnosis not present

## 2022-01-05 DIAGNOSIS — L821 Other seborrheic keratosis: Secondary | ICD-10-CM | POA: Diagnosis not present

## 2022-01-18 ENCOUNTER — Ambulatory Visit: Payer: Medicare HMO

## 2022-02-04 ENCOUNTER — Ambulatory Visit (INDEPENDENT_AMBULATORY_CARE_PROVIDER_SITE_OTHER): Payer: Medicare HMO

## 2022-02-04 DIAGNOSIS — E538 Deficiency of other specified B group vitamins: Secondary | ICD-10-CM

## 2022-02-04 MED ORDER — CYANOCOBALAMIN 1000 MCG/ML IJ SOLN
1000.0000 ug | Freq: Once | INTRAMUSCULAR | Status: AC
  Administered 2022-02-04: 1000 ug via INTRAMUSCULAR

## 2022-02-04 NOTE — Progress Notes (Signed)
Per orders of Dr. Gutierrez, injection of vit B12 given by Lavra Imler. Patient tolerated injection well.  

## 2022-02-23 ENCOUNTER — Other Ambulatory Visit: Payer: Self-pay | Admitting: Family Medicine

## 2022-02-23 NOTE — Telephone Encounter (Signed)
ERx 

## 2022-02-23 NOTE — Telephone Encounter (Signed)
Refill request Ambien Last refill 12/29/21 #30 Last office visit 11/30/21

## 2022-03-08 ENCOUNTER — Telehealth: Payer: Self-pay

## 2022-03-08 ENCOUNTER — Ambulatory Visit: Payer: Medicare HMO

## 2022-03-08 DIAGNOSIS — E538 Deficiency of other specified B group vitamins: Secondary | ICD-10-CM

## 2022-03-08 NOTE — Telephone Encounter (Signed)
Patient had physical in May 2 of this year.  Does not need physical in November just follow-up visit.   She has been receiving monthly B12 shots over the past year-would recommend scheduling a lab visit to check B12 levels prior to further decisions on replacement. I have ordered B12 blood test.

## 2022-03-08 NOTE — Telephone Encounter (Signed)
Pt was scheduled this morning for B 12 injection but per 11/30/21 visit it was noted that levels stable off B 12 will monitor. Pts last B12 lab done on 08/30/21. Pt did not get B 12 injection today and pt scheduled CPX on 06/17/2022. I discussed with Lattie Haw CMA while Dr Darnell Level was with another pt and Lattie Haw agreed per 11/30/21 note pt was off B 12 injections.Sending note to Dr Darnell Level.

## 2022-03-08 NOTE — Telephone Encounter (Signed)
Tried to call patient and did not get an answer or answering machine. Will have to try and call patient back later.

## 2022-03-09 ENCOUNTER — Other Ambulatory Visit (INDEPENDENT_AMBULATORY_CARE_PROVIDER_SITE_OTHER): Payer: Medicare HMO

## 2022-03-09 DIAGNOSIS — E538 Deficiency of other specified B group vitamins: Secondary | ICD-10-CM | POA: Diagnosis not present

## 2022-03-09 LAB — VITAMIN B12: Vitamin B-12: 391 pg/mL (ref 211–911)

## 2022-03-09 NOTE — Telephone Encounter (Addendum)
Spoke relaying Dr. Synthia Innocent message.  Pt verbalizes understanding and scheduled lab visit today at 10:30.    I changed Nov appt to f/u OV.

## 2022-03-10 ENCOUNTER — Other Ambulatory Visit: Payer: Self-pay

## 2022-04-22 ENCOUNTER — Other Ambulatory Visit: Payer: Self-pay | Admitting: Family Medicine

## 2022-04-22 NOTE — Telephone Encounter (Signed)
Name of Medication: Ambien Name of Pharmacy: [facility] or Written Date and Quantity: 02/23/22, #30 Last Office Visit and Type: 11/30/21, AWV Next Office Visit and Type: 06/17/22, 6 mo breathing f/u Last Controlled Substance Agreement Date: none Last UDS: none

## 2022-04-23 NOTE — Telephone Encounter (Signed)
ERx 

## 2022-05-10 ENCOUNTER — Telehealth: Payer: Self-pay

## 2022-05-10 NOTE — Telephone Encounter (Signed)
Left message to call back OOP cost is $315. PA on file 08/01/21-07/31/22 Next injection due 05/27/22.

## 2022-05-11 ENCOUNTER — Ambulatory Visit (INDEPENDENT_AMBULATORY_CARE_PROVIDER_SITE_OTHER): Payer: Medicare HMO

## 2022-05-11 DIAGNOSIS — Z23 Encounter for immunization: Secondary | ICD-10-CM | POA: Diagnosis not present

## 2022-05-17 ENCOUNTER — Ambulatory Visit: Payer: Medicare HMO

## 2022-05-17 NOTE — Telephone Encounter (Signed)
Patient advised. Lab on 10/20 and NV 10/27

## 2022-05-19 ENCOUNTER — Other Ambulatory Visit: Payer: Self-pay | Admitting: Family Medicine

## 2022-05-19 DIAGNOSIS — M810 Age-related osteoporosis without current pathological fracture: Secondary | ICD-10-CM

## 2022-05-19 DIAGNOSIS — D649 Anemia, unspecified: Secondary | ICD-10-CM

## 2022-05-19 DIAGNOSIS — E538 Deficiency of other specified B group vitamins: Secondary | ICD-10-CM

## 2022-05-20 ENCOUNTER — Other Ambulatory Visit: Payer: Medicare HMO

## 2022-05-24 ENCOUNTER — Other Ambulatory Visit: Payer: Medicare HMO

## 2022-05-24 ENCOUNTER — Other Ambulatory Visit (INDEPENDENT_AMBULATORY_CARE_PROVIDER_SITE_OTHER): Payer: Medicare HMO

## 2022-05-24 ENCOUNTER — Telehealth: Payer: Self-pay

## 2022-05-24 ENCOUNTER — Ambulatory Visit: Payer: Medicare HMO

## 2022-05-24 DIAGNOSIS — M810 Age-related osteoporosis without current pathological fracture: Secondary | ICD-10-CM

## 2022-05-24 DIAGNOSIS — E538 Deficiency of other specified B group vitamins: Secondary | ICD-10-CM

## 2022-05-24 DIAGNOSIS — D649 Anemia, unspecified: Secondary | ICD-10-CM

## 2022-05-24 NOTE — Telephone Encounter (Signed)
[place] Night - Client Nonclinical Telephone Record  [facility] Client [facility] Night - Client Client Site [place] Provider Ria Bush - MD Contact Type Call Who Is Calling Patient / Member / Family / Caregiver [name] Name Donique Hammonds [name] Phone Number [phone] Patient Name Courtney Thomas Patient DOB [birth year] Call Type Message Only Information Provided Reason for Call Request for General Office Information Initial Comment [name] states her car won't start and she has a lab appt. Wants to know if she can come at any time. Additional Comment Office hours provided. Disp. Time Disposition Final User 05/24/2022 7:48:51 AM General Information Provided Yes Gaspar Skeeters Call Closed By: Gaspar Skeeters Transaction Date/Time: 05/24/2022 7:46:51 AM (ET

## 2022-05-24 NOTE — Telephone Encounter (Signed)
Patient has been rescheduled.

## 2022-05-24 NOTE — Telephone Encounter (Signed)
I tried calling pt and phone line rang busy. Sending note to lsc support.

## 2022-05-25 LAB — URINALYSIS, ROUTINE W REFLEX MICROSCOPIC
Bilirubin Urine: NEGATIVE
Hgb urine dipstick: NEGATIVE
Ketones, ur: NEGATIVE
Nitrite: NEGATIVE
RBC / HPF: NONE SEEN (ref 0–?)
Specific Gravity, Urine: 1.02 (ref 1.000–1.030)
Total Protein, Urine: NEGATIVE
Urine Glucose: NEGATIVE
Urobilinogen, UA: 2 — AB (ref 0.0–1.0)
pH: 6.5 (ref 5.0–8.0)

## 2022-05-25 LAB — VITAMIN B12: Vitamin B-12: 1389 pg/mL — ABNORMAL HIGH (ref 211–911)

## 2022-05-25 LAB — CBC WITH DIFFERENTIAL/PLATELET
Basophils Absolute: 0.1 10*3/uL (ref 0.0–0.1)
Basophils Relative: 0.9 % (ref 0.0–3.0)
Eosinophils Absolute: 0.2 10*3/uL (ref 0.0–0.7)
Eosinophils Relative: 2.7 % (ref 0.0–5.0)
HCT: 29.7 % — ABNORMAL LOW (ref 36.0–46.0)
Hemoglobin: 9.5 g/dL — ABNORMAL LOW (ref 12.0–15.0)
Lymphocytes Relative: 21.3 % (ref 12.0–46.0)
Lymphs Abs: 1.6 10*3/uL (ref 0.7–4.0)
MCHC: 31.9 g/dL (ref 30.0–36.0)
MCV: 78.1 fl (ref 78.0–100.0)
Monocytes Absolute: 0.6 10*3/uL (ref 0.1–1.0)
Monocytes Relative: 7.5 % (ref 3.0–12.0)
Neutro Abs: 5 10*3/uL (ref 1.4–7.7)
Neutrophils Relative %: 67.6 % (ref 43.0–77.0)
Platelets: 270 10*3/uL (ref 150.0–400.0)
RBC: 3.8 Mil/uL — ABNORMAL LOW (ref 3.87–5.11)
RDW: 16.8 % — ABNORMAL HIGH (ref 11.5–15.5)
WBC: 7.3 10*3/uL (ref 4.0–10.5)

## 2022-05-25 LAB — BASIC METABOLIC PANEL
BUN: 22 mg/dL (ref 6–23)
CO2: 27 mEq/L (ref 19–32)
Calcium: 8.9 mg/dL (ref 8.4–10.5)
Chloride: 103 mEq/L (ref 96–112)
Creatinine, Ser: 0.9 mg/dL (ref 0.40–1.20)
GFR: 62.87 mL/min (ref >60.00)
Glucose, Bld: 86 mg/dL (ref 70–99)
Potassium: 3.7 mEq/L (ref 3.5–5.1)
Sodium: 140 mEq/L (ref 135–145)

## 2022-05-25 LAB — IBC PANEL
Iron: 35 ug/dL — ABNORMAL LOW (ref 42–145)
Saturation Ratios: 7.9 % — ABNORMAL LOW (ref 20.0–50.0)
TIBC: 441 ug/dL (ref 250.0–450.0)
Transferrin: 315 mg/dL (ref 212.0–360.0)

## 2022-05-25 LAB — FERRITIN: Ferritin: 4.1 ng/mL — ABNORMAL LOW (ref 10.0–291.0)

## 2022-05-26 ENCOUNTER — Other Ambulatory Visit: Payer: Self-pay | Admitting: Family Medicine

## 2022-05-26 ENCOUNTER — Telehealth: Payer: Self-pay

## 2022-05-26 DIAGNOSIS — D509 Iron deficiency anemia, unspecified: Secondary | ICD-10-CM

## 2022-05-26 NOTE — Telephone Encounter (Signed)
Attempted to contact pt.  No answer, no vm.  Need to relay Dr. Synthia Innocent message and get answer to his question.   Labs/Dr. Synthia Innocent msg: Will discuss labs in detail at [place].  However, your anemia is worsening with low iron levels.  Have you noted any blood in your stool?  I'd like you to do an iFOB stool kit before your 06/17/22.  Plz have her pick up at her convenience or mail to patient.

## 2022-05-26 NOTE — Telephone Encounter (Signed)
Pt rtn call.  I relayed Dr. Synthia Innocent message.  Pt verbalizes understanding and denies seeing any blood in stool.  She requests iFOB kit be mailed.  Pt will return it asap.

## 2022-05-27 ENCOUNTER — Ambulatory Visit: Payer: Medicare HMO

## 2022-05-27 NOTE — Telephone Encounter (Signed)
CrCl is 46.58 mL/min  Calcium 8.9 normal

## 2022-05-31 ENCOUNTER — Other Ambulatory Visit: Payer: Self-pay | Admitting: Family Medicine

## 2022-05-31 ENCOUNTER — Other Ambulatory Visit (INDEPENDENT_AMBULATORY_CARE_PROVIDER_SITE_OTHER): Payer: Medicare HMO

## 2022-05-31 ENCOUNTER — Telehealth: Payer: Self-pay

## 2022-05-31 ENCOUNTER — Encounter: Payer: Self-pay | Admitting: Family Medicine

## 2022-05-31 DIAGNOSIS — D509 Iron deficiency anemia, unspecified: Secondary | ICD-10-CM | POA: Diagnosis not present

## 2022-05-31 LAB — FECAL OCCULT BLOOD, IMMUNOCHEMICAL: Fecal Occult Bld: NEGATIVE

## 2022-05-31 MED ORDER — IRON (FERROUS SULFATE) 325 (65 FE) MG PO TABS: 325.0000 mg | ORAL_TABLET | Freq: Every day | ORAL | Status: DC

## 2022-05-31 NOTE — Telephone Encounter (Signed)
Attempted to contact pt.  No answer, no vm.  Need to relay stool kit results, relay Dr. Synthia Innocent message and get answer to his questions.     Stool kit/Dr. Synthia Innocent msg:  Your stool test returned negative for blood.  Recommend you repeat in 1 year.  Have you noted any vaginal bleeding?  I recommend start oral iron 1 tablet daily until appt this coming month for iron deficiency anemia. I also recommend you return to GI for further evaluation of anemia.  Would you want to return to [facility] or [facility]?

## 2022-06-01 NOTE — Telephone Encounter (Addendum)
Attempted to contact pt.  No answer, no vm.  Need to relay stool kit results, relay Dr. Synthia Innocent message and get answer to his questions.     Stool kit/Dr. Synthia Innocent msg:  Your stool test returned negative for blood.  Recommend you repeat in 1 year.  Have you noted any vaginal bleeding?  I recommend start oral iron 1 tablet daily until appt this coming month for iron deficiency anemia. I also recommend you return to GI for further evaluation of anemia.  Would you want to return to [facility] [facility] or [facility]?

## 2022-06-01 NOTE — Telephone Encounter (Signed)
Attempted to contact pt.  No answer, no vm.  Need to relay stool kit results, relay Dr. G's message and get answer to his questions.  Mailing a letter.    Stool kit/Dr. G's msg:  Your stool test returned negative for blood.  Recommend you repeat in 1 year.  Have you noted any vaginal bleeding?  I recommend start oral iron 1 tablet daily until appt this coming month for iron deficiency anemia. I also recommend you return to GI for further evaluation of anemia.  Would you want to return to [facility] or [facility]?  

## 2022-06-07 ENCOUNTER — Encounter: Payer: Self-pay | Admitting: Family Medicine

## 2022-06-07 NOTE — Telephone Encounter (Signed)
Patient called in and stated she received a letter in regards to a GI doctor. She stated she want to go see a GI doctor and she would like [facility]. Thank you!

## 2022-06-07 NOTE — Telephone Encounter (Signed)
Spoke with pt notifying her Dr. Darnell Level placed GI referral at [facility] and she can call them at [phone] to schedule an appt.  Pt expresses her thanks.

## 2022-06-07 NOTE — Telephone Encounter (Signed)
error 

## 2022-06-07 NOTE — Telephone Encounter (Addendum)
GI referral placed. She may also call [facility] to schedule appt

## 2022-06-07 NOTE — Addendum Note (Signed)
Addended by: Ria Bush on: 06/07/2022 01:53 PM   Modules accepted: Orders

## 2022-06-10 ENCOUNTER — Other Ambulatory Visit: Payer: Medicare HMO

## 2022-06-13 ENCOUNTER — Telehealth: Payer: Self-pay | Admitting: Family Medicine

## 2022-06-13 NOTE — Telephone Encounter (Signed)
Patient called and stated that the referral did not get sent to [facility] yet and wanted to know when it would be sent . Call back number [phone]

## 2022-06-15 NOTE — Telephone Encounter (Addendum)
After review of the referral, everything was sent to [facility] 06/07/2022 All notes are in [place] within the referral.  There was also a Mychart Letter sent to the patient with information 06/07/2022 per our normal protocol.   Information for Referral #: [id number]   Diagnoses:   D50.9 (ICD-10-CM) - Iron deficiency anemia, unspecified iron deficiency anemia type   Procedures: REF25 - AMB REFERRAL TO GASTROENTEROLOGY Authorization #:     Referring Provider [place] [facility] [place] [phone] Referring To Provider Information Lesly Rubenstein [place] [address] [phone]   Referral Start Date: 06/07/2022 Referral End Date: 06/07/2023   In the future please be sure to review the referral notes and Letters section in Epic for referral updates. Per our Referral Standard work, the notes are updated within the referral when we send the referral.   Thanks for letting me know.   I contacted the office, spoke with [facility] and they did not receive the referral. I have re-faxed this to their office [phone]. They will call her to schedule her appointment.   Attempted to call Patient, no voicemail, unable to leave a VM.

## 2022-06-17 ENCOUNTER — Encounter: Payer: Medicare HMO | Admitting: Family Medicine

## 2022-06-17 ENCOUNTER — Telehealth: Payer: Self-pay | Admitting: Pharmacy Technician

## 2022-06-17 ENCOUNTER — Ambulatory Visit (INDEPENDENT_AMBULATORY_CARE_PROVIDER_SITE_OTHER)
Admission: RE | Admit: 2022-06-17 | Discharge: 2022-06-17 | Disposition: A | Discharge status: Home / Self Care | Payer: Medicare HMO | Source: Ambulatory Visit | Attending: Family Medicine | Admitting: Family Medicine

## 2022-06-17 ENCOUNTER — Encounter: Payer: Self-pay | Admitting: Family Medicine

## 2022-06-17 ENCOUNTER — Ambulatory Visit (INDEPENDENT_AMBULATORY_CARE_PROVIDER_SITE_OTHER): Payer: Medicare HMO | Admitting: Family Medicine

## 2022-06-17 VITALS — BP 126/64 | HR 86 | Temp 97.7°F | Ht <= 58 in | Wt 117.4 lb

## 2022-06-17 DIAGNOSIS — R1310 Dysphagia, unspecified: Secondary | ICD-10-CM | POA: Diagnosis not present

## 2022-06-17 DIAGNOSIS — M810 Age-related osteoporosis without current pathological fracture: Secondary | ICD-10-CM | POA: Diagnosis not present

## 2022-06-17 DIAGNOSIS — J439 Emphysema, unspecified: Secondary | ICD-10-CM

## 2022-06-17 DIAGNOSIS — D509 Iron deficiency anemia, unspecified: Secondary | ICD-10-CM | POA: Diagnosis not present

## 2022-06-17 DIAGNOSIS — K222 Esophageal obstruction: Secondary | ICD-10-CM | POA: Insufficient documentation

## 2022-06-17 DIAGNOSIS — R0989 Other specified symptoms and signs involving the circulatory and respiratory systems: Secondary | ICD-10-CM | POA: Diagnosis not present

## 2022-06-17 MED ORDER — DENOSUMAB 60 MG/ML ~~LOC~~ SOSY
60.0000 mg | PREFILLED_SYRINGE | Freq: Once | SUBCUTANEOUS | Status: AC
  Administered 2022-06-17: 60 mg via SUBCUTANEOUS

## 2022-06-17 NOTE — Assessment & Plan Note (Addendum)
Severe by imaging study, managed with symbicort 80/4.52mg 2 puffs BID Abnormal lung exam - check CXR r/o bilateral pleural effusions

## 2022-06-17 NOTE — Assessment & Plan Note (Addendum)
Progressive, mildly symptomatic despite oral iron replacement.  Will send for iron infusion in [place].  iFOB, UA without blood. No vaginal bleeding.  She has GI appointment schedule for next week - ,anticipate need for EGD with endorsed dysphagia.

## 2022-06-17 NOTE — Progress Notes (Addendum)
Patient ID: Courtney Thomas, female    DOB: 05/21/1946, 76 y.o.   MRN: [id number]  This visit was conducted in person.  BP 126/64   Pulse 86   Temp 97.7 F (36.5 C) (Temporal)   Ht '4\' 10"'$  (1.473 m)   Wt 117 lb 6.4 oz (53.3 kg)   SpO2 97%   BMI 24.54 kg/m    CC: f/u COPD Subjective:   HPI: Courtney Thomas is a 76 y.o. female presenting on 06/17/2022 for Emphysema (Here for 6 mo f/u. )   She stopped working in March.   Emphysema by imaging study - managed with symbicort 80.4.10mg 2 puffs BID. Spirometry not currently available at our office.   Lab Results  Component Value Date   CREATININE 0.90 05/24/2022   BUN 22 05/24/2022   NA 140 05/24/2022   K 3.7 05/24/2022   CL 103 05/24/2022   CO2 27 05/24/2022   Lab Results  Component Value Date   WBC 7.3 05/24/2022   HGB 9.5 (L) 05/24/2022   HCT 29.7 (L) 05/24/2022   MCV 78.1 05/24/2022   PLT 270.0 05/24/2022   Lab Results  Component Value Date   IRON 35 (L) 05/24/2022   TIBC 441.0 05/24/2022   FERRITIN 4.1 (L) 05/24/2022   Marked microcytic iron deficiency anemia - progressive over the past year. Ferritin low at 4.1. Denies blood in stool, urine, vaginal bleed.  iFOB negative 11/2021 and again 05/2022. UA without blood, did have LE - anticipate asxs bacteriuria/colonization as she's not having UTI symptoms.   Notes dizziness with sudden position changes. She's been taking iron pill daily for 6 months without benefit. Notes cold intolerance.   She notes dysphagia present for the past 1 year, to both solids and liquids. At times needs to vomit food up to resolve choking sensation. No hematemesis, early satiety, odynophagia.  Weight has been stable.  No fevers/chills, night sweats, swollen glands.  No chest pain, tightness.   Chronic vitamin B12 deficiency despite monthly b12 injections, last 01/2022.   OP - prolia injection today 06/17/2022.   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 Interim medical history since our last visit reviewed. Allergies and medications reviewed and updated. Outpatient Medications Prior to Visit  Medication Sig Dispense Refill   albuterol (VENTOLIN HFA) 108 (90 Base) MCG/ACT inhaler Inhale 2 puffs into the lungs every 6 (six) hours as needed for wheezing or shortness of breath. 16 g 3   atorvastatin (LIPITOR) 10 MG tablet Take 1 tablet (10 mg total) by mouth daily. 90 tablet 3   budesonide-formoterol (SYMBICORT) 80-4.5 MCG/ACT inhaler Inhale 2 puffs into the lungs 2 (two) times daily. 1 each 11   buPROPion (WELLBUTRIN SR) 150 MG 12 hr tablet Take 1 tablet (150 mg total) by mouth daily. 90 tablet 3   Cholecalciferol (VITAMIN D3) 1.25 MG (50000 UT) TABS Take 1 tablet by mouth once a week. 12 tablet 3   Cyanocobalamin (VITAMIN B-12) 1000 MCG SUBL Place 1 tablet under the tongue daily. OTC     DULoxetine (CYMBALTA) 60 MG capsule Take 1 capsule (60 mg total) by mouth daily. 90 capsule 3   Iron, Ferrous Sulfate, 325 (65 Fe) MG TABS Take 325 mg by mouth daily.     lisinopril-hydrochlorothiazide (ZESTORETIC)  10-12.5 MG tablet Take 1 tablet by mouth daily. 90 tablet 3   zolpidem (AMBIEN) 10 MG tablet TAKE 1/2 TO 1 TABLET AT BEDTIME AS NEEDED FOR SLEEP 30 tablet 3   benzonatate (TESSALON) 200 MG capsule Take 1 capsule (200 mg total) by mouth 3 (three) times daily as needed for cough. 30 capsule 0   chlorpheniramine-HYDROcodone (TUSSIONEX PENNKINETIC ER) 10-8 MG/5ML SUER Take 5 mLs by mouth every 12 (twelve) hours as needed for cough. 60 mL 0   Cholecalciferol (VITAMIN D3) 25 MCG (1000 UT) CAPS Take 2 capsules (2,000 Units total) by mouth daily. 30 capsule    fluticasone (FLONASE) 50 MCG/ACT nasal spray Place 2 sprays into both nostrils daily. 16 g 0   No  facility-administered medications prior to visit.     Per HPI unless specifically indicated in ROS section below Review of Systems  Objective:  BP 126/64   Pulse 86   Temp 97.7 F (36.5 C) (Temporal)   Ht '4\' 10"'$  (1.473 m)   Wt 117 lb 6.4 oz (53.3 kg)   SpO2 97%   BMI 24.54 kg/m   Wt Readings from Last 3 Encounters:  06/17/22 117 lb 6.4 oz (53.3 kg)  11/30/21 118 lb 8 oz (53.8 kg)  08/27/21 115 lb (52.2 kg)      Physical Exam Vitals and nursing note reviewed.  Constitutional:      Appearance: Normal appearance. She is not ill-appearing.  HENT:     Mouth/Throat:     Mouth: Mucous membranes are moist.     Pharynx: Oropharynx is clear. No oropharyngeal exudate or posterior oropharyngeal erythema.  Eyes:     Extraocular Movements: Extraocular movements intact.     Pupils: Pupils are equal, round, and reactive to light.  Cardiovascular:     Rate and Rhythm: Normal rate and regular rhythm.     Pulses: Normal pulses.     Heart sounds: Normal heart sounds. No murmur heard. Pulmonary:     Effort: Pulmonary effort is normal. No respiratory distress.     Breath sounds: Rhonchi (diffuse) present. No wheezing or rales.     Comments: Coarse crackles bilateral lower lobes Abdominal:     General: Bowel sounds are normal. There is no distension.     Palpations: Abdomen is soft. There is no mass.     Tenderness: There is no abdominal tenderness. There is no guarding or rebound.     Hernia: No hernia is present.  Musculoskeletal:     Right lower leg: No edema.     Left lower leg: No edema.  Skin:    General: Skin is warm and dry.     Findings: No rash.  Neurological:     Mental Status: She is alert.  Psychiatric:        Mood and Affect: Mood normal.        Behavior: Behavior normal.       Results for orders placed or performed in visit on 05/31/22  Fecal occult blood, imunochemical   Specimen: Stool  Result Value Ref Range   Fecal Occult Bld Negative Negative   Lab  Results  Component Value Date   TSH 1.34 08/30/2021    Assessment & Plan:   Problem List Items Addressed This Visit     Osteoporosis    Continues q12moprolia, latest injection today.       Emphysema of lung (HCC)    Severe by imaging study, managed with symbicort 80/4.525m 2 puffs BID Abnormal lung  exam - check CXR r/o bilateral pleural effusions      Relevant Orders   DG Chest 2 View   IDA (iron deficiency anemia) - Primary    Progressive, mildly symptomatic despite oral iron replacement.  Will send for iron infusion in [place].  iFOB, UA without blood. No vaginal bleeding.  She has GI appointment schedule for next week - ,anticipate need for EGD with endorsed dysphagia.       Dysphagia    Newly endorsed, in setting of new iron deficiency anemia.  Pending GI evaluation next week.  If unable to get in soon for lumenal evaluation, consider CT imaging.         Meds ordered this encounter  Medications   denosumab (PROLIA) injection 60 mg    Order Specific Question:   Patient is enrolled in [facility] program for this medication and I have provided a copy of the Prolia Medication Guide and Patient Brochure.    Answer:   No    Order Specific Question:   I have reviewed with the patient the information in the Prolia Medication Guide and Patient Counseling Chart including the serious risks of Prolia and symptoms of each risk.    Answer:   No    Order Specific Question:   I have advised the patient to seek medical attention if they have signs or symptoms of any of the serious risks.    Answer:   No   Orders Placed This Encounter  Procedures   DG Chest 2 View    Standing Status:   Future    Number of Occurrences:   1    Standing Expiration Date:   06/18/2023    Order Specific Question:   Reason for Exam (SYMPTOM  OR DIAGNOSIS REQUIRED)    Answer:   abnormal lung sounds both lung bases eval for pleural effusion    Order Specific Question:   Preferred imaging location?     Answer:   [facility]     Patient Instructions  Chest xray today  Prolia injection today.  We will set you up for iron infusion. I'm glad you're seeing GI doctor next week.   Follow up plan: Return if symptoms worsen or fail to improve.  Ria Bush, MD

## 2022-06-17 NOTE — Assessment & Plan Note (Signed)
Newly endorsed, in setting of new iron deficiency anemia.  Pending GI evaluation next week.  If unable to get in soon for lumenal evaluation, consider CT imaging.

## 2022-06-17 NOTE — Telephone Encounter (Signed)
Pt given Prolia inj at today's OV.

## 2022-06-17 NOTE — Patient Instructions (Signed)
Chest xray today  Prolia injection today.  We will set you up for iron infusion. I'm glad you're seeing GI doctor next week.

## 2022-06-17 NOTE — Assessment & Plan Note (Signed)
Continues q5moprolia, latest injection today.

## 2022-06-17 NOTE — Telephone Encounter (Signed)
Auth Submission: NO AUTH NEEDED Payer: [employer] MEDICARE Medication & CPT/J Code(s) submitted: Venofer (Iron Sucrose) J1756 Route of submission (phone, fax, portal):  Phone # Fax # Auth type: Buy/Bill Units/visits requested: X5 Reference number:  Approval from: 06/17/22 to 07/31/22

## 2022-06-21 DIAGNOSIS — D500 Iron deficiency anemia secondary to blood loss (chronic): Secondary | ICD-10-CM | POA: Diagnosis not present

## 2022-06-21 DIAGNOSIS — R1310 Dysphagia, unspecified: Secondary | ICD-10-CM | POA: Diagnosis not present

## 2022-07-06 ENCOUNTER — Telehealth: Payer: Self-pay

## 2022-07-06 NOTE — Telephone Encounter (Signed)
Last Prolia inj: 06/17/22 Next Prolia inj DUE: 12/15/22

## 2022-08-03 ENCOUNTER — Telehealth: Payer: Self-pay | Admitting: Family Medicine

## 2022-08-03 NOTE — Telephone Encounter (Signed)
Patient called in regarding a referral that was sent out for her to [place] [facility] at [facility] She hasn't heard anything from them so she gave them a call and they stated that they do not have a referral for her? She would like to know the status of this referral.

## 2022-08-03 NOTE — Telephone Encounter (Signed)
Referral notes indicate the infusion center contacted the patient on 07/17/22 and they indicate "Patient Refusal" as reason for referral being closed.  Spoke with patient and she is adamant she did not speak with anyone from the infusion center regarding scheduling.   Message sent to referral coordinator. She has reopened and resent referral. Nothing further needed at this time.

## 2022-08-15 ENCOUNTER — Ambulatory Visit (INDEPENDENT_AMBULATORY_CARE_PROVIDER_SITE_OTHER): Payer: Medicare HMO

## 2022-08-15 VITALS — BP 96/60 | HR 66 | Temp 97.8°F | Resp 18 | Ht <= 58 in | Wt 118.0 lb

## 2022-08-15 DIAGNOSIS — D509 Iron deficiency anemia, unspecified: Secondary | ICD-10-CM | POA: Diagnosis not present

## 2022-08-15 MED ORDER — ACETAMINOPHEN 325 MG PO TABS
650.0000 mg | ORAL_TABLET | Freq: Once | ORAL | Status: AC
  Administered 2022-08-15: 650 mg via ORAL
  Filled 2022-08-15: qty 2

## 2022-08-15 MED ORDER — DIPHENHYDRAMINE HCL 25 MG PO CAPS
25.0000 mg | ORAL_CAPSULE | Freq: Once | ORAL | Status: AC
  Administered 2022-08-15: 25 mg via ORAL
  Filled 2022-08-15: qty 1

## 2022-08-15 MED ORDER — SODIUM CHLORIDE 0.9 % IV SOLN
200.0000 mg | Freq: Once | INTRAVENOUS | Status: AC
  Administered 2022-08-15: 200 mg via INTRAVENOUS
  Filled 2022-08-15: qty 10

## 2022-08-15 NOTE — Progress Notes (Signed)
Diagnosis: Iron Deficiency Anemia  Provider:  Marshell Garfinkel MD  Procedure: Infusion  IV Type: Peripheral, IV Location: L Hand  Venofer (Iron Sucrose), Dose: 200 mg  Infusion Start Time: 6484  Infusion Stop Time: 1205  Post Infusion IV Care: Observation period completed and Peripheral IV Discontinued  Discharge: Condition: Good, Destination: Home . AVS provided to patient.   Performed by:  Arnoldo Morale, RN

## 2022-08-15 NOTE — Patient Instructions (Signed)
Iron Sucrose Injection What is this medication? IRON SUCROSE (EYE ern SOO krose) treats low levels of iron (iron deficiency anemia) in people with kidney disease. Iron is a mineral that plays an important role in making red blood cells, which carry oxygen from your lungs to the rest of your body. This medicine may be used for other purposes; ask your health care provider or pharmacist if you have questions. COMMON BRAND NAME(S): Venofer What should I tell my care team before I take this medication? They need to know if you have any of these conditions: Anemia not caused by low iron levels Heart disease High levels of iron in the blood Kidney disease Liver disease An unusual or allergic reaction to iron, other medications, foods, dyes, or preservatives Pregnant or trying to get pregnant Breastfeeding How should I use this medication? This medication is for infusion into a vein. It is given in a hospital or clinic setting. Talk to your care team about the use of this medication in children. While this medication may be prescribed for children as young as 2 years for selected conditions, precautions do apply. Overdosage: If you think you have taken too much of this medicine contact a poison control center or emergency room at once. NOTE: This medicine is only for you. Do not share this medicine with others. What if I miss a dose? Keep appointments for follow-up doses. It is important not to miss your dose. Call your care team if you are unable to keep an appointment. What may interact with this medication? Do not take this medication with any of the following: Deferoxamine Dimercaprol Other iron products This medication may also interact with the following: Chloramphenicol Deferasirox This list may not describe all possible interactions. Give your health care provider a list of all the medicines, herbs, non-prescription drugs, or dietary supplements you use. Also tell them if you smoke,  drink alcohol, or use illegal drugs. Some items may interact with your medicine. What should I watch for while using this medication? Visit your care team regularly. Tell your care team if your symptoms do not start to get better or if they get worse. You may need blood work done while you are taking this medication. You may need to follow a special diet. Talk to your care team. Foods that contain iron include: whole grains/cereals, dried fruits, beans, or peas, leafy green vegetables, and organ meats (liver, kidney). What side effects may I notice from receiving this medication? Side effects that you should report to your care team as soon as possible: Allergic reactions--skin rash, itching, hives, swelling of the face, lips, tongue, or throat Low blood pressure--dizziness, feeling faint or lightheaded, blurry vision Shortness of breath Side effects that usually do not require medical attention (report to your care team if they continue or are bothersome): Flushing Headache Joint pain Muscle pain Nausea Pain, redness, or irritation at injection site This list may not describe all possible side effects. Call your doctor for medical advice about side effects. You may report side effects to FDA at 1-800-FDA-1088. Where should I keep my medication? This medication is given in a hospital or clinic and will not be stored at home. NOTE: This sheet is a summary. It may not cover all possible information. If you have questions about this medicine, talk to your doctor, pharmacist, or health care provider.  2023 Elsevier/Gold Standard (2020-10-29 00:00:00)

## 2022-08-17 ENCOUNTER — Ambulatory Visit (INDEPENDENT_AMBULATORY_CARE_PROVIDER_SITE_OTHER): Payer: Medicare HMO

## 2022-08-17 VITALS — BP 105/60 | HR 71 | Temp 98.7°F | Resp 16 | Ht <= 58 in | Wt 117.4 lb

## 2022-08-17 DIAGNOSIS — D509 Iron deficiency anemia, unspecified: Secondary | ICD-10-CM

## 2022-08-17 MED ORDER — DIPHENHYDRAMINE HCL 25 MG PO CAPS
25.0000 mg | ORAL_CAPSULE | Freq: Once | ORAL | Status: AC
  Administered 2022-08-17: 25 mg via ORAL
  Filled 2022-08-17: qty 1

## 2022-08-17 MED ORDER — ACETAMINOPHEN 325 MG PO TABS
650.0000 mg | ORAL_TABLET | Freq: Once | ORAL | Status: AC
  Administered 2022-08-17: 650 mg via ORAL
  Filled 2022-08-17: qty 2

## 2022-08-17 MED ORDER — SODIUM CHLORIDE 0.9 % IV SOLN
200.0000 mg | Freq: Once | INTRAVENOUS | Status: AC
  Administered 2022-08-17: 200 mg via INTRAVENOUS
  Filled 2022-08-17: qty 10

## 2022-08-17 NOTE — Progress Notes (Signed)
Diagnosis: Iron Deficiency Anemia  Provider:  Marshell Garfinkel MD  Procedure: Infusion  IV Type: Peripheral, IV Location: R Antecubital  Venofer (Iron Sucrose), Dose: 200 mg  Infusion Start Time: 3073  Infusion Stop Time: 5430  Post Infusion IV Care: Peripheral IV Discontinued  Discharge: Condition: Good, Destination: Home . AVS provided to patient.   Performed by:  Arnoldo Morale, RN

## 2022-08-19 ENCOUNTER — Ambulatory Visit (INDEPENDENT_AMBULATORY_CARE_PROVIDER_SITE_OTHER): Payer: Medicare HMO

## 2022-08-19 VITALS — BP 107/49 | HR 71 | Temp 97.7°F | Resp 16 | Ht 58.5 in | Wt 117.8 lb

## 2022-08-19 DIAGNOSIS — D509 Iron deficiency anemia, unspecified: Secondary | ICD-10-CM | POA: Diagnosis not present

## 2022-08-19 MED ORDER — ACETAMINOPHEN 325 MG PO TABS: 650.0000 mg | ORAL_TABLET | Freq: Once | ORAL | Status: DC

## 2022-08-19 MED ORDER — DIPHENHYDRAMINE HCL 25 MG PO CAPS: 25.0000 mg | ORAL_CAPSULE | Freq: Once | ORAL | Status: DC

## 2022-08-19 MED ORDER — SODIUM CHLORIDE 0.9 % IV SOLN
200.0000 mg | Freq: Once | INTRAVENOUS | Status: AC
  Administered 2022-08-19: 200 mg via INTRAVENOUS
  Filled 2022-08-19: qty 10

## 2022-08-19 NOTE — Progress Notes (Signed)
Diagnosis: Infusion  Provider:  Marshell Garfinkel MD  Procedure: Infusion  IV Type: Peripheral, IV Location: L Antecubital  Venofer (Iron Sucrose), Dose: 200 mg  Infusion Start Time: 1747  Infusion Stop Time: 1595  Post Infusion IV Care: Peripheral IV Discontinued  Discharge: Condition: Good, Destination: Home . AVS provided to patient.   Performed by:  Adelina Mings, LPN

## 2022-08-22 ENCOUNTER — Ambulatory Visit (INDEPENDENT_AMBULATORY_CARE_PROVIDER_SITE_OTHER): Payer: Medicare HMO

## 2022-08-22 VITALS — BP 115/71 | HR 78 | Temp 97.4°F | Resp 18 | Ht <= 58 in | Wt 119.8 lb

## 2022-08-22 DIAGNOSIS — D509 Iron deficiency anemia, unspecified: Secondary | ICD-10-CM | POA: Diagnosis not present

## 2022-08-22 MED ORDER — SODIUM CHLORIDE 0.9 % IV SOLN
200.0000 mg | Freq: Once | INTRAVENOUS | Status: AC
  Administered 2022-08-22: 200 mg via INTRAVENOUS
  Filled 2022-08-22: qty 10

## 2022-08-22 MED ORDER — ACETAMINOPHEN 325 MG PO TABS
650.0000 mg | ORAL_TABLET | Freq: Once | ORAL | Status: AC
  Administered 2022-08-22: 650 mg via ORAL
  Filled 2022-08-22: qty 2

## 2022-08-22 MED ORDER — DIPHENHYDRAMINE HCL 25 MG PO CAPS
25.0000 mg | ORAL_CAPSULE | Freq: Once | ORAL | Status: AC
  Administered 2022-08-22: 25 mg via ORAL
  Filled 2022-08-22: qty 1

## 2022-08-22 NOTE — Progress Notes (Signed)
Diagnosis: Iron Deficiency Anemia  Provider:  Marshell Garfinkel MD  Procedure: Infusion  IV Type: Peripheral, IV Location: L Antecubital  Venofer (Iron Sucrose), Dose: 200 mg  Infusion Start Time: 1320  Infusion Stop Time: 3750  Post Infusion IV Care: Peripheral IV Discontinued  Discharge: Condition: Good, Destination: Home . AVS provided to patient.   Performed by:  Arnoldo Morale, RN

## 2022-08-24 ENCOUNTER — Ambulatory Visit (INDEPENDENT_AMBULATORY_CARE_PROVIDER_SITE_OTHER): Payer: Medicare HMO

## 2022-08-24 VITALS — BP 106/67 | HR 71 | Temp 98.1°F | Resp 16 | Ht <= 58 in | Wt 116.8 lb

## 2022-08-24 DIAGNOSIS — D509 Iron deficiency anemia, unspecified: Secondary | ICD-10-CM

## 2022-08-24 MED ORDER — ACETAMINOPHEN 325 MG PO TABS
650.0000 mg | ORAL_TABLET | Freq: Once | ORAL | Status: AC
  Administered 2022-08-24: 650 mg via ORAL
  Filled 2022-08-24: qty 2

## 2022-08-24 MED ORDER — SODIUM CHLORIDE 0.9 % IV SOLN
200.0000 mg | Freq: Once | INTRAVENOUS | Status: AC
  Administered 2022-08-24: 200 mg via INTRAVENOUS
  Filled 2022-08-24: qty 10

## 2022-08-24 MED ORDER — DIPHENHYDRAMINE HCL 25 MG PO CAPS
25.0000 mg | ORAL_CAPSULE | Freq: Once | ORAL | Status: AC
  Administered 2022-08-24: 25 mg via ORAL
  Filled 2022-08-24: qty 1

## 2022-08-24 NOTE — Progress Notes (Signed)
Diagnosis: Iron Deficiency Anemia  Provider:  Marshell Garfinkel MD  Procedure: Infusion  IV Type: Peripheral, IV Location: R Antecubital  Venofer (Iron Sucrose), Dose: 200 mg  Infusion Start Time: 2979  Infusion Stop Time: 1207  Post Infusion IV Care: Peripheral IV Discontinued  Discharge: Condition: Good, Destination: Home . AVS provided to patient.   Performed by:  Koren Shiver, RN

## 2022-08-30 ENCOUNTER — Ambulatory Visit (INDEPENDENT_AMBULATORY_CARE_PROVIDER_SITE_OTHER): Payer: Medicare HMO

## 2022-08-30 VITALS — Wt 116.0 lb

## 2022-08-30 DIAGNOSIS — Z0000 Encounter for general adult medical examination without abnormal findings: Secondary | ICD-10-CM

## 2022-08-30 NOTE — Progress Notes (Signed)
Virtual Visit via Telephone Note  I connected with  Courtney Thomas on 08/30/22 at  9:15 AM EST by telephone and verified that I am speaking with the correct person using two identifiers.  Location: Patient: home Provider: [place] Persons participating in the virtual visit: [place]   I discussed the limitations, risks, security and privacy concerns of performing an evaluation and management service by telephone and the availability of in person appointments. The patient expressed understanding and agreed to proceed.  Interactive audio and video telecommunications were attempted between this nurse and patient, however failed, due to patient having technical difficulties OR patient did not have access to video capability.  We continued and completed visit with audio only.  Some vital signs may be absent or patient reported.   Dionisio David, LPN  Subjective:   Courtney GARCIAGARCIA is a 77 y.o. female who presents for Medicare Annual (Subsequent) preventive examination.  Review of Systems     Cardiac Risk Factors include: advanced age (>35mn, >>41women);hypertension;dyslipidemia     Objective:    There were no vitals filed for this visit. There is no height or weight on file to calculate BMI.     08/30/2022    9:26 AM 08/27/2021    8:57 AM 08/02/2021    1:51 PM 08/26/2020    9:13 AM 04/05/2019   10:39 AM 03/28/2018    8:10 AM 03/27/2017    3:32 PM  Advanced Directives  Does Patient Have a Medical Advance Directive? Yes Yes No Yes Yes Yes Yes  Type of AParamed[place]of [place] City[place] will [place]Clen[place] TractLiving will  [place]Living will Living will [place]er ParkLiving will Living will;Healthcare Power of Attorney  Does patient want to make changes to medical advance directive?  Yes (MAU/Ambulatory/Procedural Areas - Information given)       Copy of [place] Chart? No -  copy requested   No - copy requested  No - copy requested No - copy requested    Current Medications (verified) Outpatient Encounter Medications as of 08/30/2022  Medication Sig   albuterol (VENTOLIN HFA) 108 (90 Base) MCG/ACT inhaler Inhale 2 puffs into the lungs every 6 (six) hours as needed for wheezing or shortness of breath.   atorvastatin (LIPITOR) 10 MG tablet Take 1 tablet (10 mg total) by mouth daily.   budesonide-formoterol (SYMBICORT) 80-4.5 MCG/ACT inhaler Inhale 2 puffs into the lungs 2 (two) times daily.   buPROPion (WELLBUTRIN SR) 150 MG 12 hr tablet Take 1 tablet (150 mg total) by mouth daily.   Cholecalciferol (VITAMIN D3) 1.25 MG (50000 UT) TABS Take 1 tablet by mouth once a week.   Cyanocobalamin (VITAMIN B-12) 1000 MCG SUBL Place 1 tablet under the tongue daily. OTC   DULoxetine (CYMBALTA) 60 MG capsule Take 1 capsule (60 mg total) by mouth daily.   lisinopril-hydrochlorothiazide (ZESTORETIC) 10-12.5 MG tablet Take 1 tablet by mouth daily.   zolpidem (AMBIEN) 10 MG tablet TAKE 1/2 TO 1 TABLET AT BEDTIME AS NEEDED FOR SLEEP   Iron, Ferrous Sulfate, 325 (65 Fe) MG TABS Take 325 mg by mouth daily. (Patient not taking: Reported on 08/30/2022)   No facility-administered encounter medications on file as of 08/30/2022.    Allergies (verified) Eggs or egg-derived products, Codeine, and Penicillins   History: Past Medical History:  Diagnosis Date   Adjustment disorder with depressed mood    Asthma    B12 deficiency  anemia 2014   mild   Frequent headaches    History of chicken pox    Hyperlipidemia    Hypertension    Insomnia    Osteoporosis    latest dexa 05/2016 T -3.3 spine (unchanged), -2.0 hip - on fosamax since ~2015   Seasonal allergic rhinitis    Past Surgical History:  Procedure Laterality Date   ABDOMINAL HYSTERECTOMY     BREAST CYST ASPIRATION Left 80s   benign   BREAST EXCISIONAL BIOPSY Right 1971   neg   COLONOSCOPY  07/2013   diverticulosis,  erythematous mucosa in sigmoid - biopsy with focal vascular congestion/edema without colitis or dysplasia ([facility])   dexa  03/2014   T score spine -3.5, hip -2.0   NASAL SINUS SURGERY     deviated septum   TONSILLECTOMY  1970   TOTAL ABDOMINAL HYSTERECTOMY W/ BILATERAL SALPINGOOPHORECTOMY  1994   uterine cyst with heavy bleeding   Family History  Problem Relation Age of Onset   Cancer Mother 83       lung met to brain, smoker   Stroke Mother    Alzheimer's disease Mother    Arthritis Father    Arthritis Sister    Cancer Brother 61       pancreatic   Diabetes Maternal Grandmother    Breast cancer Neg Hx    Social History   Socioeconomic History   Marital status: Widowed    Spouse name: Not on file   Number of children: Not on file   Years of education: Not on file   Highest education level: Not on file  Occupational History   Not on file  Tobacco Use   Smoking status: Former   Smokeless tobacco: Never   Tobacco comments:    minimal smoking as a teenager  Vaping Use   Vaping Use: Never used  Substance and Sexual Activity   Alcohol use: No   Drug use: No   Sexual activity: Not Currently  Other Topics Concern   Not on file  Social History Narrative   Lives alone - husband passed away 67. 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   Activity: no regular exercise   Diet: some water daily, fruits/vegetables daily   Social Determinants of Health   Financial Resource Strain: Low Risk  (08/30/2022)   Overall Financial Resource Strain (CARDIA)    Difficulty of Paying Living Expenses: Not hard at all  Food Insecurity: No Food Insecurity (08/30/2022)   Hunger Vital Sign    Worried About Running Out of Food in the Last Year: Never true    Ran Out of Food in the Last Year: Never true  Transportation Needs: No Transportation Needs (08/30/2022)   PRAPARE - Hydrologist (Medical): No     Lack of Transportation (Non-Medical): No  Physical Activity: Inactive (08/30/2022)   Exercise Vital Sign    Days of Exercise per Week: 0 days    Minutes of Exercise per Session: 0 min  Stress: No Stress Concern Present (08/30/2022)   [place]    Feeling of Stress : Not at all  Social Connections: Moderately Isolated (08/30/2022)   Social Connection and Isolation Panel [NHANES]    Frequency of Communication with Friends and Family: More than three times a week    Frequency of Social Gatherings with Friends and Family: Once a week  Attends Religious Services: More than 4 times per year    Active Member of Clubs or Organizations: No    Attends Archivist Meetings: Never    Marital Status: Widowed    Tobacco Counseling Counseling given: Not Answered Tobacco comments: minimal smoking as a teenager   Clinical Intake:  Pre-visit preparation completed: Yes  Pain : No/denies pain     Nutritional Risks: None Diabetes: No  How often do you need to have someone help you when you read instructions, pamphlets, or other written materials from your doctor or pharmacy?: 1 - Never  Diabetic?no  Interpreter Needed?: No  Information entered by :: Kirke Shaggy, LPN   Activities of Daily Living    08/30/2022    9:28 AM  In your present state of health, do you have any difficulty performing the following activities:  Hearing? 1  Vision? 1  Difficulty concentrating or making decisions? 0  Walking or climbing stairs? 0  Dressing or bathing? 0  Doing errands, shopping? 0  Preparing Food and eating ? N  Using the Toilet? N  In the past six months, have you accidently leaked urine? N  Do you have problems with loss of bowel control? N  Managing your Medications? N  Managing your Finances? N  Housekeeping or managing your Housekeeping? N    Patient Care Team: Ria Bush, MD as PCP - General (Family  Medicine) Lorelee Cover., MD as Consulting Physician (Ophthalmology) Mellissa Kohut, DDS, PA as Consulting Physician (Dentistry)  Indicate any recent Medical Services you may have received from other than [facility] providers in the past year (date may be approximate).     Assessment:   This is a routine wellness examination for Jahlisa.  Hearing/Vision screen Hearing Screening - Comments:: No aids Vision Screening - Comments:: Readers- Dr.Bell  Dietary issues and exercise activities discussed: Current Exercise Habits: The patient does not participate in regular exercise at present   Goals Addressed               This Visit's Progress     Patient Stated (pt-stated)        Less forgetful, more focused       Depression Screen    08/30/2022    9:25 AM 08/27/2021    9:00 AM 08/26/2020    9:14 AM 04/05/2019   10:40 AM 03/28/2018    8:10 AM 03/21/2018    6:20 PM 03/27/2017    3:31 PM  PHQ 2/9 Scores  PHQ - 2 Score 0 0 0 0 0 0 0  PHQ- 9 Score 0  0 3 0 4 1    Fall Risk    08/30/2022    9:27 AM 11/30/2021    4:07 PM 08/27/2021    8:58 AM 08/26/2020    9:13 AM 04/05/2019   10:40 AM  Fall Risk   Falls in the past year? 0 0 0 0 0  Number falls in past yr: 0 0 0 0   Injury with Fall? 0  0 0   Risk for fall due to : No Fall Risks  No Fall Risks Medication side effect Medication side effect  Follow up Falls prevention discussed;Falls evaluation completed  Falls prevention discussed Falls evaluation completed;Falls prevention discussed Falls evaluation completed;Falls prevention discussed    FALL RISK PREVENTION PERTAINING TO THE HOME:  Any stairs in or around the home? Yes  If so, are there any without handrails? No  Home free of loose throw rugs in  walkways, pet beds, electrical cords, etc? Yes  Adequate lighting in your home to reduce risk of falls? Yes   ASSISTIVE DEVICES UTILIZED TO PREVENT FALLS:  Life alert? Yes  Use of a cane, walker or w/c? No  Grab bars in the bathroom?  Yes  Shower chair or bench in shower? No  Elevated toilet seat or a handicapped toilet? No    Cognitive Function:    08/26/2020    9:17 AM 04/05/2019   10:44 AM 03/28/2018    8:10 AM 03/27/2017    3:32 PM 03/25/2016    9:56 AM  MMSE - Mini Mental State Exam  Orientation to time '5 5 5 5 5  '$ Orientation to Place '5 5 5 5 5  '$ Registration '3 3 3 3 3  '$ Attention/ Calculation 5 5 0 0 0  Recall '3 3 2 3 3  '$ Recall-comments   unable to recall 1 of 3 words    Language- name 2 objects  0 0 0 0  Language- repeat '1 1 1 1 1  '$ Language- follow 3 step command  0 '3 3 3  '$ Language- read & follow direction  0 0 0 0  Write a sentence  0 0 0 0  Copy design  0 0 0 0  Total score  '22 19 20 20        '$ 08/30/2022    9:32 AM  6CIT Screen  What Year? 0 points  What month? 0 points  What time? 3 points  Count back from 20 0 points  Months in reverse 0 points  Repeat phrase 0 points  Total Score 3 points    Immunizations Immunization History  Administered Date(s) Administered   Influenza Inj Mdck Quad Pf 04/16/2019, 04/22/2020, 04/20/2021, 05/11/2022   Influenza, Quadrivalent, Recombinant, Inj, Pf 04/01/2016, 05/09/2017   PFIZER(Purple Top)SARS-COV-2 Vaccination 09/02/2019, 09/23/2019, 05/04/2020   Pneumococcal Conjugate-13 08/27/2015   Pneumococcal Polysaccharide-23 04/05/2017   Tdap 08/02/2011    TDAP status: Due, Education has been provided regarding the importance of this vaccine. Advised may receive this vaccine at local pharmacy or Health Dept. Aware to provide a copy of the vaccination record if obtained from local pharmacy or Health Dept. Verbalized acceptance and understanding.  Flu Vaccine status: Up to date  Pneumococcal vaccine status: Up to date  Covid-19 vaccine status: Completed vaccines  Qualifies for Shingles Vaccine? Yes   Zostavax completed No   Shingrix Completed?: No.    Education has been provided regarding the importance of this vaccine. Patient has been advised to call  insurance company to determine out of pocket expense if they have not yet received this vaccine. Advised may also receive vaccine at local pharmacy or Health Dept. Verbalized acceptance and understanding.  Screening Tests Health Maintenance  Topic Date Due   Zoster Vaccines- Shingrix (1 of 2) Never done   COVID-19 Vaccine (4 - 2023-24 season) 04/01/2022   MAMMOGRAM  08/30/2022   DTaP/Tdap/Td (2 - Td or Tdap) 03/25/2024 (Originally 08/01/2021)   [place] FOBT  06/01/2023   COLONOSCOPY (Pts 45-59yr Insurance coverage will need to be confirmed)  08/02/2023   Medicare Annual Wellness (AWV)  08/31/2023   Pneumonia Vaccine 75 Years old  Completed   INFLUENZA VACCINE  Completed   DEXA SCAN  Completed   Hepatitis C Screening  Completed   HPV VACCINES  Aged Out    Health Maintenance  Health Maintenance Due  Topic Date Due   Zoster Vaccines- Shingrix (1 of 2) Never  done   COVID-19 Vaccine (4 - 2023-24 season) 04/01/2022   MAMMOGRAM  08/30/2022    Colorectal cancer screening: Type of screening: Colonoscopy. Completed 08/01/13. Repeat every 10 years- has one scheduled 09/22/22  Mammogram status: Completed 08/30/21. Repeat every year- will call to schedule  Bone Density status: Completed 12/20/21. Results reflect: Bone density results: OSTEOPOROSIS. Repeat every 2 years.  Lung Cancer Screening: (Low Dose CT Chest recommended if Age 44-80 years, 30 pack-year currently smoking OR have quit w/in 15years.) does not qualify.   Additional Screening:  Hepatitis C Screening: does qualify; Completed 03/25/16  Vision Screening: Recommended annual ophthalmology exams for early detection of glaucoma and other disorders of the eye. Is the patient up to date with their annual eye exam?  Yes  Who is the provider or what is the name of the office in which the patient attends annual eye exams? Dr.Bell If pt is not established with a provider, would they like to be referred to a provider  to establish care? No .   Dental Screening: Recommended annual dental exams for proper oral hygiene  Community Resource Referral / Chronic Care Management: CRR required this visit?  No   CCM required this visit?  No      Plan:     I have personally reviewed and noted the following in the patient's chart:   Medical and social history Use of alcohol, tobacco or illicit drugs  Current medications and supplements including opioid prescriptions. Patient is not currently taking opioid prescriptions. Functional ability and status Nutritional status Physical activity Advanced directives List of other physicians Hospitalizations, surgeries, and ER visits in previous 12 months Vitals Screenings to include cognitive, depression, and falls Referrals and appointments  In addition, I have reviewed and discussed with patient certain preventive protocols, quality metrics, and best practice recommendations. A written personalized care plan for preventive services as well as general preventive health recommendations were provided to patient.     Dionisio David, LPN   12/02/5463   Nurse Notes: none

## 2022-08-30 NOTE — Patient Instructions (Signed)
Courtney Thomas , Thank you for taking time to come for your Medicare Wellness Visit. I appreciate your ongoing commitment to your health goals. Please review the following plan we discussed and let me know if I can assist you in the future.   These are the goals we discussed:  Goals       Patient Stated      Starting 03/28/2018, I will continue to take medications as prescribed.       Patient Stated      08/26/2020, I will maintain and continue medications as prescribed.       Patient Stated      Would like to exercise more      Patient Stated (pt-stated)      Less forgetful, more focused      Weight (lb) < 200 lb (90.7 kg)      04/05/2019, wants to weigh 110 pounds        This is a list of the screening recommended for you and due dates:  Health Maintenance  Topic Date Due   Zoster (Shingles) Vaccine (1 of 2) Never done   COVID-19 Vaccine (4 - 2023-24 season) 04/01/2022   Mammogram  08/30/2022   DTaP/Tdap/Td vaccine (2 - Td or Tdap) 03/25/2024*   Stool Blood Test  06/01/2023   Colon Cancer Screening  08/02/2023   Medicare Annual Wellness Visit  08/31/2023   Pneumonia Vaccine  Completed   Flu Shot  Completed   DEXA scan (bone density measurement)  Completed   Hepatitis C Screening: USPSTF Recommendation to screen - Ages 54-79 yo.  Completed   HPV Vaccine  Aged Out  *Topic was postponed. The date shown is not the original due date.    Advanced directives: yes,need copies  Conditions/risks identified: no  Next appointment: Follow up in one year for your annual wellness visit 09/04/23 @ 9:30 am by phone   Preventive Care 65 Years and Older, Female Preventive care refers to lifestyle choices and visits with your health care provider that can promote health and wellness. What does preventive care include? A yearly physical exam. This is also called an annual well check. Dental exams once or twice a year. Routine eye exams. Ask your health care provider how often you should have  your eyes checked. Personal lifestyle choices, including: Daily care of your teeth and gums. Regular physical activity. Eating a healthy diet. Avoiding tobacco and drug use. Limiting alcohol use. Practicing safe sex. Taking low-dose aspirin every day. Taking vitamin and mineral supplements as recommended by your health care provider. What happens during an annual well check? The services and screenings done by your health care provider during your annual well check will depend on your age, overall health, lifestyle risk factors, and family history of disease. Counseling  Your health care provider may ask you questions about your: Alcohol use. Tobacco use. Drug use. Emotional well-being. Home and relationship well-being. Sexual activity. Eating habits. History of falls. Memory and ability to understand (cognition). Work and work Statistician. Reproductive health. Screening  You may have the following tests or measurements: Height, weight, and BMI. Blood pressure. Lipid and cholesterol levels. These may be checked every 5 years, or more frequently if you are over 24 years old. Skin check. Lung cancer screening. You may have this screening every year starting at age 55 if you have a 30-pack-year history of smoking and currently smoke or have quit within the past 15 years. Fecal occult blood test (FOBT) of the stool.  You may have this test every year starting at age 30. Flexible sigmoidoscopy or colonoscopy. You may have a sigmoidoscopy every 5 years or a colonoscopy every 10 years starting at age 63. Hepatitis C blood test. Hepatitis B blood test. Sexually transmitted disease (STD) testing. Diabetes screening. This is done by checking your blood sugar (glucose) after you have not eaten for a while (fasting). You may have this done every 1-3 years. Bone density scan. This is done to screen for osteoporosis. You may have this done starting at age 14. Mammogram. This may be done every  1-2 years. Talk to your health care provider about how often you should have regular mammograms. Talk with your health care provider about your test results, treatment options, and if necessary, the need for more tests. Vaccines  Your health care provider may recommend certain vaccines, such as: Influenza vaccine. This is recommended every year. Tetanus, diphtheria, and acellular pertussis (Tdap, Td) vaccine. You may need a Td booster every 10 years. Zoster vaccine. You may need this after age 48. Pneumococcal 13-valent conjugate (PCV13) vaccine. One dose is recommended after age 48. Pneumococcal polysaccharide (PPSV23) vaccine. One dose is recommended after age 29. Talk to your health care provider about which screenings and vaccines you need and how often you need them. This information is not intended to replace advice given to you by your health care provider. Make sure you discuss any questions you have with your health care provider. Document Released: 08/14/2015 Document Revised: 04/06/2016 Document Reviewed: 05/19/2015 Elsevier Interactive Patient Education  2017 [employer] Prevention in the Home Falls can cause injuries. They can happen to people of all ages. There are many things you can do to make your home safe and to help prevent falls. What can I do on the outside of my home? Regularly fix the edges of walkways and driveways and fix any cracks. Remove anything that might make you trip as you walk through a door, such as a raised step or threshold. Trim any bushes or trees on the path to your home. Use bright outdoor lighting. Clear any walking paths of anything that might make someone trip, such as rocks or tools. Regularly check to see if handrails are loose or broken. Make sure that both sides of any steps have handrails. Any raised decks and porches should have guardrails on the edges. Have any leaves, snow, or ice cleared regularly. Use sand or salt on walking  paths during winter. Clean up any spills in your garage right away. This includes oil or grease spills. What can I do in the bathroom? Use night lights. Install grab bars by the toilet and in the tub and shower. Do not use towel bars as grab bars. Use non-skid mats or decals in the tub or shower. If you need to sit down in the shower, use a plastic, non-slip stool. Keep the floor dry. Clean up any water that spills on the floor as soon as it happens. Remove soap buildup in the tub or shower regularly. Attach bath mats securely with double-sided non-slip rug tape. Do not have throw rugs and other things on the floor that can make you trip. What can I do in the bedroom? Use night lights. Make sure that you have a light by your bed that is easy to reach. Do not use any sheets or blankets that are too big for your bed. They should not hang down onto the floor. Have a firm chair that has  side arms. You can use this for support while you get dressed. Do not have throw rugs and other things on the floor that can make you trip. What can I do in the kitchen? Clean up any spills right away. Avoid walking on wet floors. Keep items that you use a lot in easy-to-reach places. If you need to reach something above you, use a strong step stool that has a grab bar. Keep electrical cords out of the way. Do not use floor polish or wax that makes floors slippery. If you must use wax, use non-skid floor wax. Do not have throw rugs and other things on the floor that can make you trip. What can I do with my stairs? Do not leave any items on the stairs. Make sure that there are handrails on both sides of the stairs and use them. Fix handrails that are broken or loose. Make sure that handrails are as long as the stairways. Check any carpeting to make sure that it is firmly attached to the stairs. Fix any carpet that is loose or worn. Avoid having throw rugs at the top or bottom of the stairs. If you do have  throw rugs, attach them to the floor with carpet tape. Make sure that you have a light switch at the top of the stairs and the bottom of the stairs. If you do not have them, ask someone to add them for you. What else can I do to help prevent falls? Wear shoes that: Do not have high heels. Have rubber bottoms. Are comfortable and fit you well. Are closed at the toe. Do not wear sandals. If you use a stepladder: Make sure that it is fully opened. Do not climb a closed stepladder. Make sure that both sides of the stepladder are locked into place. Ask someone to hold it for you, if possible. Clearly mark and make sure that you can see: Any grab bars or handrails. First and last steps. Where the edge of each step is. Use tools that help you move around (mobility aids) if they are needed. These include: Canes. Walkers. Scooters. Crutches. Turn on the lights when you go into a dark area. Replace any light bulbs as soon as they burn out. Set up your furniture so you have a clear path. Avoid moving your furniture around. If any of your floors are uneven, fix them. If there are any pets around you, be aware of where they are. Review your medicines with your doctor. Some medicines can make you feel dizzy. This can increase your chance of falling. Ask your doctor what other things that you can do to help prevent falls. This information is not intended to replace advice given to you by your health care provider. Make sure you discuss any questions you have with your health care provider. Document Released: 05/14/2009 Document Revised: 12/24/2015 Document Reviewed: 08/22/2014 Elsevier Interactive Patient Education  2017 [employer].

## 2022-09-01 HISTORY — PX: ESOPHAGOGASTRODUODENOSCOPY: SHX1529

## 2022-09-01 HISTORY — PX: COLONOSCOPY: SHX174

## 2022-09-21 ENCOUNTER — Encounter: Payer: Self-pay | Admitting: Gastroenterology

## 2022-09-22 ENCOUNTER — Ambulatory Visit: Payer: Medicare HMO | Admitting: Certified Registered Nurse Anesthetist

## 2022-09-22 ENCOUNTER — Encounter: Payer: Self-pay | Admitting: Gastroenterology

## 2022-09-22 ENCOUNTER — Encounter
Admission: RE | Disposition: A | Discharge status: Home / Self Care | Payer: Self-pay | Source: Ambulatory Visit | Attending: Gastroenterology

## 2022-09-22 ENCOUNTER — Ambulatory Visit
Admission: RE | Admit: 2022-09-22 | Discharge: 2022-09-22 | Disposition: A | Discharge status: Home / Self Care | Payer: Medicare HMO | Source: Ambulatory Visit | Attending: Gastroenterology | Admitting: Gastroenterology

## 2022-09-22 DIAGNOSIS — K222 Esophageal obstruction: Secondary | ICD-10-CM | POA: Insufficient documentation

## 2022-09-22 DIAGNOSIS — K6289 Other specified diseases of anus and rectum: Secondary | ICD-10-CM | POA: Diagnosis not present

## 2022-09-22 DIAGNOSIS — K449 Diaphragmatic hernia without obstruction or gangrene: Secondary | ICD-10-CM | POA: Diagnosis not present

## 2022-09-22 DIAGNOSIS — D649 Anemia, unspecified: Secondary | ICD-10-CM | POA: Diagnosis not present

## 2022-09-22 DIAGNOSIS — Z79899 Other long term (current) drug therapy: Secondary | ICD-10-CM | POA: Insufficient documentation

## 2022-09-22 DIAGNOSIS — Z538 Procedure and treatment not carried out for other reasons: Secondary | ICD-10-CM | POA: Diagnosis not present

## 2022-09-22 DIAGNOSIS — K259 Gastric ulcer, unspecified as acute or chronic, without hemorrhage or perforation: Secondary | ICD-10-CM | POA: Insufficient documentation

## 2022-09-22 DIAGNOSIS — Z90710 Acquired absence of both cervix and uterus: Secondary | ICD-10-CM | POA: Diagnosis not present

## 2022-09-22 DIAGNOSIS — I10 Essential (primary) hypertension: Secondary | ICD-10-CM | POA: Diagnosis not present

## 2022-09-22 DIAGNOSIS — K314 Gastric diverticulum: Secondary | ICD-10-CM | POA: Diagnosis not present

## 2022-09-22 DIAGNOSIS — D500 Iron deficiency anemia secondary to blood loss (chronic): Secondary | ICD-10-CM | POA: Insufficient documentation

## 2022-09-22 DIAGNOSIS — K5790 Diverticulosis of intestine, part unspecified, without perforation or abscess without bleeding: Secondary | ICD-10-CM | POA: Diagnosis not present

## 2022-09-22 DIAGNOSIS — K5730 Diverticulosis of large intestine without perforation or abscess without bleeding: Secondary | ICD-10-CM | POA: Diagnosis not present

## 2022-09-22 DIAGNOSIS — J449 Chronic obstructive pulmonary disease, unspecified: Secondary | ICD-10-CM | POA: Insufficient documentation

## 2022-09-22 DIAGNOSIS — K2950 Unspecified chronic gastritis without bleeding: Secondary | ICD-10-CM | POA: Insufficient documentation

## 2022-09-22 DIAGNOSIS — Z539 Procedure and treatment not carried out, unspecified reason: Secondary | ICD-10-CM | POA: Diagnosis not present

## 2022-09-22 DIAGNOSIS — D759 Disease of blood and blood-forming organs, unspecified: Secondary | ICD-10-CM | POA: Insufficient documentation

## 2022-09-22 DIAGNOSIS — R1310 Dysphagia, unspecified: Secondary | ICD-10-CM | POA: Insufficient documentation

## 2022-09-22 DIAGNOSIS — K5289 Other specified noninfective gastroenteritis and colitis: Secondary | ICD-10-CM | POA: Diagnosis not present

## 2022-09-22 HISTORY — PX: ESOPHAGOGASTRODUODENOSCOPY: SHX5428

## 2022-09-22 HISTORY — PX: COLONOSCOPY: SHX5424

## 2022-09-22 SURGERY — COLONOSCOPY
Anesthesia: General

## 2022-09-22 MED ORDER — GLYCOPYRROLATE 0.2 MG/ML IJ SOLN
INTRAMUSCULAR | Status: AC
  Filled 2022-09-22: qty 1

## 2022-09-22 MED ORDER — SODIUM CHLORIDE 0.9 % IV SOLN: INTRAVENOUS | Status: DC

## 2022-09-22 MED ORDER — PROPOFOL 500 MG/50ML IV EMUL
INTRAVENOUS | Status: DC | PRN
  Administered 2022-09-22: 140 ug/kg/min via INTRAVENOUS

## 2022-09-22 MED ORDER — PROPOFOL 1000 MG/100ML IV EMUL
INTRAVENOUS | Status: AC
  Filled 2022-09-22: qty 300

## 2022-09-22 MED ORDER — PROPOFOL 10 MG/ML IV BOLUS
INTRAVENOUS | Status: DC | PRN
  Administered 2022-09-22: 40 mg via INTRAVENOUS
  Administered 2022-09-22: 20 mg via INTRAVENOUS

## 2022-09-22 MED ORDER — LIDOCAINE HCL (PF) 2 % IJ SOLN
INTRAMUSCULAR | Status: AC
  Filled 2022-09-22: qty 5

## 2022-09-22 MED ORDER — PHENYLEPHRINE 80 MCG/ML (10ML) SYRINGE FOR IV PUSH (FOR BLOOD PRESSURE SUPPORT)
PREFILLED_SYRINGE | INTRAVENOUS | Status: AC
  Filled 2022-09-22: qty 10

## 2022-09-22 MED ORDER — PHENYLEPHRINE HCL (PRESSORS) 10 MG/ML IV SOLN
INTRAVENOUS | Status: DC | PRN
  Administered 2022-09-22: 80 ug via INTRAVENOUS

## 2022-09-22 MED ORDER — LIDOCAINE HCL (CARDIAC) PF 100 MG/5ML IV SOSY
PREFILLED_SYRINGE | INTRAVENOUS | Status: DC | PRN
  Administered 2022-09-22: 60 mg via INTRAVENOUS

## 2022-09-22 NOTE — H&P (Signed)
Pre-Procedure H&P   Patient ID: Courtney Thomas is a 77 y.o. female.  Gastroenterology Provider: [name], DO  PCP: Courtney Bush, MD  Date: 09/22/2022  HPI [name] is a 77 y.o. female who presents today for Esophagogastroduodenoscopy and Colonoscopy for Dysphagia, and iron deficiency anemia .  Patient with iron deficiency anemia with ferritin 4.1 hemoglobin 9.4 MCV 78 saturation 7.9% TIBC 441 creatinine 0.9 B12 1389.  Baseline hemoglobin from 11-12 since 2019.  Patient is currently on iron supplementation via infusion.  She takes 2 tablets of Aleve at least 3 times a week.  Patient has midsternal sticking with solid foods- specifically stringy meats.  No odynophagia or issues with liquids or pills.  She has no melena hematochezia diarrhea or constipation.  She was FIT negative x 2 in May and October 2023  Colonoscopy performed 2014 reportedly negative.  No previous EGD. S/p hysterectomy  Past Medical History:  Diagnosis Date   Adjustment disorder with depressed mood    Asthma    B12 deficiency anemia 2014   mild   Frequent headaches    History of chicken pox    Hyperlipidemia    Hypertension    Insomnia    Osteoporosis    latest dexa 05/2016 T -3.3 spine (unchanged), -2.0 hip - on fosamax since ~2015   Seasonal allergic rhinitis     Past Surgical History:  Procedure Laterality Date   ABDOMINAL HYSTERECTOMY     BREAST CYST ASPIRATION Left 80s   benign   BREAST EXCISIONAL BIOPSY Right 1971   neg   COLONOSCOPY  07/2013   diverticulosis, erythematous mucosa in sigmoid - biopsy with focal vascular congestion/edema without colitis or dysplasia ([facility])   dexa  03/2014   T score spine -3.5, hip -2.0   NASAL SINUS SURGERY     deviated septum   TONSILLECTOMY  1970   TOTAL ABDOMINAL HYSTERECTOMY W/ BILATERAL SALPINGOOPHORECTOMY  1994   uterine cyst with heavy bleeding    Family History No h/o GI disease or malignancy  Review of Systems   Constitutional:  Negative for activity change, appetite change, chills, diaphoresis, fatigue, fever and unexpected weight change.  HENT:  Positive for trouble swallowing. Negative for voice change.   Respiratory:  Negative for shortness of breath and wheezing.   Cardiovascular:  Negative for chest pain, palpitations and leg swelling.  Gastrointestinal:  Negative for abdominal distention, abdominal pain, anal bleeding, blood in stool, constipation, diarrhea, nausea, rectal pain and vomiting.  Musculoskeletal:  Negative for arthralgias and myalgias.  Skin:  Negative for color change and pallor.  Neurological:  Negative for dizziness, syncope and weakness.  Psychiatric/Behavioral:  Negative for confusion.   All other systems reviewed and are negative.    Medications No current facility-administered medications on file prior to encounter.   Current Outpatient Medications on File Prior to Encounter  Medication Sig Dispense Refill   albuterol (VENTOLIN HFA) 108 (90 Base) MCG/ACT inhaler Inhale 2 puffs into the lungs every 6 (six) hours as needed for wheezing or shortness of breath. 16 g 3   atorvastatin (LIPITOR) 10 MG tablet Take 1 tablet (10 mg total) by mouth daily. 90 tablet 3   budesonide-formoterol (SYMBICORT) 80-4.5 MCG/ACT inhaler Inhale 2 puffs into the lungs 2 (two) times daily. 1 each 11   buPROPion (WELLBUTRIN SR) 150 MG 12 hr tablet Take 1 tablet (150 mg total) by mouth daily. 90 tablet 3   DULoxetine (CYMBALTA) 60 MG capsule Take  1 capsule (60 mg total) by mouth daily. 90 capsule 3   lisinopril-hydrochlorothiazide (ZESTORETIC) 10-12.5 MG tablet Take 1 tablet by mouth daily. 90 tablet 3   naproxen sodium (ALEVE) 220 MG tablet Take 220 mg by mouth.     zolpidem (AMBIEN) 10 MG tablet TAKE 1/2 TO 1 TABLET AT BEDTIME AS NEEDED FOR SLEEP 30 tablet 3   Cholecalciferol (VITAMIN D3) 1.25 MG (50000 UT) TABS Take 1 tablet by mouth once a week. 12 tablet 3   Cyanocobalamin (VITAMIN B-12) 1000  MCG SUBL Place 1 tablet under the tongue daily. OTC     Iron, Ferrous Sulfate, 325 (65 Fe) MG TABS Take 325 mg by mouth daily. (Patient not taking: Reported on 08/30/2022)      Pertinent medications related to GI and procedure were reviewed by me with the patient prior to the procedure   Current Facility-Administered Medications:    0.9 %  sodium chloride infusion, , Intravenous, Continuous, Courtney Helling, DO, Last Rate: 20 mL/hr at 09/22/22 0717, New Bag at 09/22/22 [id number]  sodium chloride 20 mL/hr at 09/22/22 [id number]       Allergies  Allergen Reactions   Eggs Or Egg-Derived Products Anaphylaxis and Swelling   Codeine Nausea Only   Penicillins Rash    .p    Allergies were reviewed by me prior to the procedure  Objective   Body mass index is 24.2 kg/m. Vitals:   09/22/22 0706  BP: (!) 116/56  Pulse: (!) 54  Resp: 16  Temp: (!) 97 F (36.1 C)  TempSrc: Temporal  SpO2: 100%  Weight: 52.5 kg     Physical Exam Vitals and nursing note reviewed.  Constitutional:      General: She is not in acute distress.    Appearance: Normal appearance. She is not ill-appearing, toxic-appearing or diaphoretic.  HENT:     Head: Normocephalic and atraumatic.     Nose: Nose normal.     Mouth/Throat:     Mouth: Mucous membranes are moist.     Pharynx: Oropharynx is clear.  Eyes:     General: No scleral icterus.    Extraocular Movements: Extraocular movements intact.  Cardiovascular:     Rate and Rhythm: Regular rhythm. Bradycardia present.     Heart sounds: Normal heart sounds. No murmur heard.    No friction rub. No gallop.  Pulmonary:     Effort: Pulmonary effort is normal. No respiratory distress.     Breath sounds: Normal breath sounds. No wheezing, rhonchi or rales.  Abdominal:     General: Bowel sounds are normal. There is no distension.     Palpations: Abdomen is soft.     Tenderness: There is no abdominal tenderness. There is no guarding or rebound.  Musculoskeletal:      Cervical back: Neck supple.     Right lower leg: No edema.     Left lower leg: No edema.  Skin:    General: Skin is warm and dry.     Coloration: Skin is not jaundiced or pale.  Neurological:     General: No focal deficit present.     Mental Status: She is alert and oriented to person, place, and time. Mental status is at baseline.  Psychiatric:        Mood and Affect: Mood normal.        Behavior: Behavior normal.        Thought Content: Thought content normal.        Judgment: Judgment normal.  Assessment:  [name] is a 77 y.o. female  who presents today for Esophagogastroduodenoscopy and Colonoscopy for Dysphagia, and iron deficiency anemia .  Plan:  Esophagogastroduodenoscopy and Colonoscopy with possible intervention today  Esophagogastroduodenoscopy and Colonoscopy with possible biopsy, control of bleeding, polypectomy, and interventions as necessary has been discussed with the patient/patient representative. Informed consent was obtained from the patient/patient representative after explaining the indication, nature, and risks of the procedure including but not limited to death, bleeding, perforation, missed neoplasm/lesions, cardiorespiratory compromise, and reaction to medications. Opportunity for questions was given and appropriate answers were provided. Patient/patient representative has verbalized understanding is amenable to undergoing the procedure.   Courtney Helling, DO  [facility]  Portions of the record may have been created with voice recognition software. Occasional wrong-word or 'sound-a-like' substitutions may have occurred due to the inherent limitations of voice recognition software.  Read the chart carefully and recognize, using context, where substitutions may have occurred.

## 2022-09-22 NOTE — Interval H&P Note (Signed)
History and Physical Interval Note: Preprocedure H&P from 09/22/22  was reviewed and there was no interval change after seeing and examining the patient.  Written consent was obtained from the patient after discussion of risks, benefits, and alternatives. Patient has consented to proceed with Esophagogastroduodenoscopy and Colonoscopy with possible intervention   09/22/2022 7:22 AM  Courtney Thomas  has presented today for surgery, with the diagnosis of Iron deficiency anemia due to chronic blood loss (D50.0) Dysphagia, unspecified type (R13.10).  The various methods of treatment have been discussed with the patient and family. After consideration of risks, benefits and other options for treatment, the patient has consented to  Procedure(s): COLONOSCOPY (N/A) ESOPHAGOGASTRODUODENOSCOPY (EGD) (N/A) as a surgical intervention.  The patient's history has been reviewed, patient examined, no change in status, stable for surgery.  I have reviewed the patient's chart and labs.  Questions were answered to the patient's satisfaction.     [name]

## 2022-09-22 NOTE — Op Note (Signed)
[facility] Patient Name: Courtney Thomas Procedure Date: 09/22/2022 7:17 AM MRN: [id number] Account #: [id number] Date of Birth: April 06, 1946 Admit Type: Outpatient Age: 77 Room: [facility] ENDO ROOM 1 Gender: Female Note Status: Finalized Instrument Name: Altamese Cabal Endoscope [id number] Procedure:             Upper GI endoscopy Indications:           Dysphagia Providers:             Rueben Bash, DO Referring MD:          Ria Bush (Referring MD) Medicines:             Monitored Anesthesia Care Complications:         No immediate complications. Estimated blood loss:                         Minimal. Procedure:             Pre-Anesthesia Assessment:                        - Prior to the procedure, a History and Physical was                         performed, and patient medications and allergies were                         reviewed. The patient is competent. The risks and                         benefits of the procedure and the sedation options and                         risks were discussed with the patient. All questions                         were answered and informed consent was obtained.                         Patient identification and proposed procedure were                         verified by the physician, the nurse, the anesthetist                         and the technician in the endoscopy suite. Mental                         Status Examination: alert and oriented. Airway                         Examination: normal oropharyngeal airway and neck                         mobility. Respiratory Examination: clear to                         auscultation. CV Examination: RRR, no murmurs, no S3  or S4. Prophylactic Antibiotics: The patient does not                         require prophylactic antibiotics. Prior                         Anticoagulants: The patient has taken no anticoagulant                         or  antiplatelet agents. ASA Grade Assessment: III - A                         patient with severe systemic disease. After reviewing                         the risks and benefits, the patient was deemed in                         satisfactory condition to undergo the procedure. The                         anesthesia plan was to use monitored anesthesia care                         (MAC). Immediately prior to administration of                         medications, the patient was re-assessed for adequacy                         to receive sedatives. The heart rate, respiratory                         rate, oxygen saturations, blood pressure, adequacy of                         pulmonary ventilation, and response to care were                         monitored throughout the procedure. The physical                         status of the patient was re-assessed after the                         procedure.                        After obtaining informed consent, the endoscope was                         passed under direct vision. Throughout the procedure,                         the patient's blood pressure, pulse, and oxygen                         saturations were monitored continuously. The Endoscope  was introduced through the mouth, and advanced to the                         second part of duodenum. The upper GI endoscopy was                         accomplished without difficulty. The patient tolerated                         the procedure well. Findings:      The duodenal bulb, first portion of the duodenum and second portion of       the duodenum were normal. Biopsies for histology were taken with a cold       forceps for evaluation of celiac disease. Estimated blood loss was       minimal.      Three non-bleeding cratered gastric ulcers with a clean ulcer base       (Forrest Class III) were found in the gastric antrum. The largest lesion       was 7 mm in largest  dimension. Biopsies were taken with a cold forceps       for Helicobacter pylori testing. Biopsies were taken with a cold forceps       for histology. Estimated blood loss was minimal.      A small non-bleeding diverticulum was found in the gastric fundus.       Estimated blood loss: none.      The exam of the stomach was otherwise normal.      The Z-line was regular. Estimated blood loss: none.      Esophagogastric landmarks were identified: the gastroesophageal junction       was found at 34 cm from the incisors.      A widely patent Schatzki ring was found at the gastroesophageal       junction. The scope was withdrawn. Dilation was performed with a Maloney       dilator with no resistance at 48 Fr. The dilation site was examined       following endoscope reinsertion and showed mild mucosal disruption.       small superficial tear at GEJ and distal esophagus Estimated blood loss       was minimal.      The exam of the esophagus was otherwise normal. Impression:            - Normal duodenal bulb, first portion of the duodenum                         and second portion of the duodenum. Biopsied.                        - Non-bleeding gastric ulcers with a clean ulcer base                         (Forrest Class III). Biopsied.                        - Gastric diverticulum.                        - Z-line regular.                        -  Esophagogastric landmarks identified.                        - Widely patent Schatzki ring. Dilated. Recommendation:        - Patient has a contact number available for                         emergencies. The signs and symptoms of potential                         delayed complications were discussed with the patient.                         Return to normal activities tomorrow. Written                         discharge instructions were provided to the patient.                        - Discharge patient to home.                        - Resume previous  diet.                        - Continue present medications.                        - No aspirin, ibuprofen, naproxen, or other                         non-steroidal anti-inflammatory drugs.                        - Use Protonix (pantoprazole) 40 mg PO BID for 8 weeks.                        - Await pathology results.                        - Repeat upper endoscopy PRN for retreatment.                        - Return to GI office as previously scheduled.                        - The findings and recommendations were discussed with                         the patient.                        - proceed with colonoscopy Procedure Code(s):     --- Professional ---                        [phone], Esophagogastroduodenoscopy, flexible,                         transoral; with biopsy, single or multiple  [id number], Dilation of esophagus, by unguided sound or                         bougie, single or multiple passes Diagnosis Code(s):     --- Professional ---                        K25.9, Gastric ulcer, unspecified as acute or chronic,                         without hemorrhage or perforation                        K31.4, Gastric diverticulum                        K22.2, Esophageal obstruction                        R13.10, Dysphagia, unspecified CPT copyright 2022 American Medical Association. All rights reserved. The codes documented in this report are preliminary and upon coder review may  be revised to meet current compliance requirements. Attending Participation:      I personally performed the entire procedure. Volney American, DO [name] DO, DO 09/22/2022 7:48:13 AM This report has been signed electronically. Number of Addenda: 0 Note Initiated On: 09/22/2022 7:17 AM Estimated Blood Loss:  Estimated blood loss was minimal.      [facility]

## 2022-09-22 NOTE — Transfer of Care (Signed)
Immediate Anesthesia Transfer of Care Note  Patient: Courtney Thomas  Procedure(s) Performed: COLONOSCOPY ESOPHAGOGASTRODUODENOSCOPY (EGD)  Patient Location: PACU  Anesthesia Type:General  Level of Consciousness: drowsy  Airway & Oxygen Therapy: Patient Spontanous Breathing  Post-op Assessment: Report given to RN and Post -op Vital signs reviewed and stable  Post vital signs: REviwed and stable  Last Vitals:  Vitals Value Taken Time  BP    Temp    Pulse    Resp    SpO2      Last Pain:  Vitals:   09/22/22 0706  TempSrc: Temporal         Complications: No notable events documented.

## 2022-09-22 NOTE — Op Note (Addendum)
[facility] Patient Name: Courtney Thomas Procedure Date: 09/22/2022 7:16 AM MRN: [id number] Account #: [id number] Date of Birth: 1946-06-27 Admit Type: Outpatient Age: 77 Room: [facility] ENDO ROOM 1 Gender: Female Note Status: Finalized Instrument Name: Colonoscope [id number] Procedure:             Colonoscopy Indications:           Iron deficiency anemia Providers:             Rueben Bash, DO Referring MD:          Ria Bush (Referring MD) Medicines:             Monitored Anesthesia Care Complications:         No immediate complications. Estimated blood loss: None. Procedure:             Pre-Anesthesia Assessment:                        - Prior to the procedure, a History and Physical was                         performed, and patient medications and allergies were                         reviewed. The patient is competent. The risks and                         benefits of the procedure and the sedation options and                         risks were discussed with the patient. All questions                         were answered and informed consent was obtained.                         Patient identification and proposed procedure were                         verified by the physician, the nurse, the anesthetist                         and the technician in the endoscopy suite. Mental                         Status Examination: alert and oriented. Airway                         Examination: normal oropharyngeal airway and neck                         mobility. Respiratory Examination: clear to                         auscultation. CV Examination: RRR, no murmurs, no S3                         or S4. Prophylactic Antibiotics: The patient does not  require prophylactic antibiotics. Prior                         Anticoagulants: The patient has taken no anticoagulant                         or antiplatelet agents. ASA  Grade Assessment: III - A                         patient with severe systemic disease. After reviewing                         the risks and benefits, the patient was deemed in                         satisfactory condition to undergo the procedure. The                         anesthesia plan was to use monitored anesthesia care                         (MAC). Immediately prior to administration of                         medications, the patient was re-assessed for adequacy                         to receive sedatives. The heart rate, respiratory                         rate, oxygen saturations, blood pressure, adequacy of                         pulmonary ventilation, and response to care were                         monitored throughout the procedure. The physical                         status of the patient was re-assessed after the                         procedure.                        After obtaining informed consent, the colonoscope was                         passed under direct vision. Throughout the procedure,                         the patient's blood pressure, pulse, and oxygen                         saturations were monitored continuously. The                         Colonoscope was introduced through the anus with the  intention of advancing to the cecum. The scope was                         advanced to the sigmoid colon before the procedure was                         aborted. Medications were given. The colonoscopy was                         somewhat difficult due to inadequate bowel prep.                         Successful completion of the procedure was aided by                         lavage. The patient tolerated the procedure well. The                         quality of the bowel preparation was poor and                         inadequate. The rectum was photographed. Findings:      The perianal and digital rectal examinations were  normal. Pertinent       negatives include normal sphincter tone.      A large amount of semi-solid increasing and thickening as moving       proximally stool was found in the rectum, in the recto-sigmoid colon and       in the sigmoid colon, interfering with visualization. Lavage of the area       was performed, resulting in incomplete clearance with continued poor       visualization. Estimated blood loss: none.      Procedure aborted due to increasing amounts of stool as moving more       proximally into the colon. Estimated blood loss: none.      Anal papilla(e) were hypertrophied. Estimated blood loss: none.      Multiple small-mouthed diverticula were found in the recto-sigmoid colon       and sigmoid colon. Estimated blood loss: none. Impression:            - Preparation of the colon was poor.                        - Preparation of the colon was inadequate.                        - Stool in the rectum, in the recto-sigmoid colon and                         in the sigmoid colon.                        - Anal papilla(e) were hypertrophied.                        - Diverticulosis in the recto-sigmoid colon and in the                         sigmoid colon.                        -  No specimens collected. Recommendation:        - Patient has a contact number available for                         emergencies. The signs and symptoms of potential                         delayed complications were discussed with the patient.                         Return to normal activities tomorrow. Written                         discharge instructions were provided to the patient.                        - Discharge patient to home.                        - Resume previous diet.                        - No aspirin, ibuprofen, naproxen, or other                         non-steroidal anti-inflammatory drugs.                        - Repeat colonoscopy in 6 months because the bowel                          preparation was poor.                        - Return to GI office as previously scheduled.                        - The findings and recommendations were discussed with                         the patient. Procedure Code(s):     --- Professional ---                        [phone], 75, Colonoscopy, flexible; diagnostic,                         including collection of specimen(s) by brushing or                         washing, when performed (separate procedure) Diagnosis Code(s):     --- Professional ---                        K62.89, Other specified diseases of anus and rectum                        D50.9, Iron deficiency anemia, unspecified CPT copyright 2022 American Medical Association. All rights reserved. The codes documented in this report are preliminary and upon coder review may  be revised to meet current compliance requirements. Attending Participation:  I personally performed the entire procedure. Volney American, DO [name] DO, DO 09/22/2022 7:59:40 AM This report has been signed electronically. Number of Addenda: 0 Note Initiated On: 09/22/2022 7:16 AM Total Procedure Duration: 0 hours 3 minutes 21 seconds  Estimated Blood Loss:  Estimated blood loss: none.      [facility]

## 2022-09-22 NOTE — Anesthesia Postprocedure Evaluation (Signed)
Anesthesia Post Note  Patient: Courtney Thomas  Procedure(s) Performed: COLONOSCOPY ESOPHAGOGASTRODUODENOSCOPY (EGD)  Patient location during evaluation: PACU Anesthesia Type: General Level of consciousness: awake Pain management: pain level controlled Vital Signs Assessment: post-procedure vital signs reviewed and stable Respiratory status: spontaneous breathing Cardiovascular status: stable Anesthetic complications: no   No notable events documented.   Last Vitals:  Vitals:   09/22/22 0807 09/22/22 0818  BP: 105/61 (!) 112/55  Pulse: 79 78  Resp: 20   Temp:  (!) 35.8 C  SpO2:  95%    Last Pain:  Vitals:   09/22/22 0818  TempSrc: Temporal                 VAN STAVEREN,Mardee Clune

## 2022-09-22 NOTE — Anesthesia Preprocedure Evaluation (Signed)
Anesthesia Evaluation  Patient identified by MRN, date of birth, ID band Patient awake    Reviewed: Allergy & Precautions, NPO status , Patient's Chart, lab work & pertinent test results  Airway Mallampati: III  TM Distance: >3 FB Neck ROM: full    Dental  (+) Partial Upper   Pulmonary neg pulmonary ROS, COPD, Patient abstained from smoking., former smoker   Pulmonary exam normal  + decreased breath sounds      Cardiovascular Exercise Tolerance: Good hypertension, Pt. on medications negative cardio ROS Normal cardiovascular exam Rhythm:Regular     Neuro/Psych  Headaches negative neurological ROS  negative psych ROS   GI/Hepatic negative GI ROS, Neg liver ROS,,,  Endo/Other  negative endocrine ROS    Renal/GU negative Renal ROS  negative genitourinary   Musculoskeletal   Abdominal Normal abdominal exam  (+)   Peds negative pediatric ROS (+)  Hematology negative hematology ROS (+) Blood dyscrasia, anemia   Anesthesia Other Findings Past Medical History: No date: Adjustment disorder with depressed mood No date: Asthma 2014: B12 deficiency anemia     Comment:  mild No date: Frequent headaches No date: History of chicken pox No date: Hyperlipidemia No date: Hypertension No date: Insomnia No date: Osteoporosis     Comment:  latest dexa 05/2016 T -3.3 spine (unchanged), -2.0 hip -              on fosamax since ~2015 No date: Seasonal allergic rhinitis  Past Surgical History: No date: ABDOMINAL HYSTERECTOMY 80s: BREAST CYST ASPIRATION; Left     Comment:  benign 1971: BREAST EXCISIONAL BIOPSY; Right     Comment:  neg 07/2013: COLONOSCOPY     Comment:  diverticulosis, erythematous mucosa in sigmoid - biopsy               with focal vascular congestion/edema without colitis or               dysplasia ([facility]) 03/2014: dexa     Comment:  T score spine -3.5, hip -2.0 No date: NASAL SINUS SURGERY      Comment:  deviated septum 1970: TONSILLECTOMY 1994: TOTAL ABDOMINAL HYSTERECTOMY W/ BILATERAL SALPINGOOPHORECTOMY     Comment:  uterine cyst with heavy bleeding  BMI    Body Mass Index: 24.20 kg/m      Reproductive/Obstetrics negative OB ROS                             Anesthesia Physical Anesthesia Plan  ASA: 3  Anesthesia Plan: General   Post-op Pain Management:    Induction: Intravenous  PONV Risk Score and Plan: Propofol infusion and TIVA  Airway Management Planned: Natural Airway and Nasal Cannula  Additional Equipment:   Intra-op Plan:   Post-operative Plan:   Informed Consent: I have reviewed the patients History and Physical, chart, labs and discussed the procedure including the risks, benefits and alternatives for the proposed anesthesia with the patient or authorized representative who has indicated his/her understanding and acceptance.     Dental Advisory Given  Plan Discussed with: Anesthesiologist, CRNA and Surgeon  Anesthesia Plan Comments:        Anesthesia Quick Evaluation

## 2022-09-23 ENCOUNTER — Encounter: Payer: Self-pay | Admitting: Gastroenterology

## 2022-09-23 LAB — SURGICAL PATHOLOGY

## 2022-09-26 ENCOUNTER — Encounter: Payer: Self-pay | Admitting: Family Medicine

## 2022-10-11 ENCOUNTER — Telehealth: Payer: Self-pay

## 2022-10-11 ENCOUNTER — Other Ambulatory Visit: Payer: Self-pay

## 2022-10-11 DIAGNOSIS — F5101 Primary insomnia: Secondary | ICD-10-CM

## 2022-10-11 DIAGNOSIS — E785 Hyperlipidemia, unspecified: Secondary | ICD-10-CM

## 2022-10-11 MED ORDER — ATORVASTATIN CALCIUM 10 MG PO TABS: 10.0000 mg | ORAL_TABLET | Freq: Every day | ORAL | 0 refills | Status: DC

## 2022-10-11 NOTE — Telephone Encounter (Signed)
Name of Medication: Ambien Name of Pharmacy: [facility] or Written Date and Quantity: 04/23/22, #30 Last Office Visit and Type: 06/17/22, 6/ mo emphysema f/u Next Office Visit and Type: none Last Controlled Substance Agreement Date: none Last UDS: none

## 2022-10-11 NOTE — Telephone Encounter (Signed)
Pt had MCR wellness on 08/30/22. Plz schedule CPE and fasting lab visits to prevent delays in future refills.

## 2022-10-12 MED ORDER — ZOLPIDEM TARTRATE 10 MG PO TABS: ORAL_TABLET | ORAL | 3 refills | Status: DC

## 2022-10-12 NOTE — Telephone Encounter (Signed)
ERx 

## 2022-10-12 NOTE — Telephone Encounter (Signed)
LVM for patient to call back and schedule

## 2022-10-12 NOTE — Telephone Encounter (Signed)
Noted  

## 2022-10-12 NOTE — Telephone Encounter (Signed)
Patient has been scheduled

## 2022-10-13 ENCOUNTER — Other Ambulatory Visit: Payer: Self-pay | Admitting: Family Medicine

## 2022-10-13 DIAGNOSIS — Z1231 Encounter for screening mammogram for malignant neoplasm of breast: Secondary | ICD-10-CM

## 2022-10-28 ENCOUNTER — Ambulatory Visit
Admission: RE | Admit: 2022-10-28 | Discharge: 2022-10-28 | Disposition: A | Discharge status: Home / Self Care | Payer: Medicare HMO | Source: Ambulatory Visit | Attending: Family Medicine | Admitting: Family Medicine

## 2022-10-28 DIAGNOSIS — Z1231 Encounter for screening mammogram for malignant neoplasm of breast: Secondary | ICD-10-CM | POA: Diagnosis not present

## 2022-11-14 ENCOUNTER — Other Ambulatory Visit: Payer: Self-pay | Admitting: Family Medicine

## 2022-11-17 NOTE — Telephone Encounter (Signed)
Prolia VOB initiated via [email or web address] 

## 2022-11-21 ENCOUNTER — Telehealth: Payer: Self-pay

## 2022-11-21 ENCOUNTER — Other Ambulatory Visit (HOSPITAL_COMMUNITY): Payer: Self-pay

## 2022-11-21 NOTE — Telephone Encounter (Signed)
Pharmacy Patient Advocate Encounter   Received notification that prior authorization for Prolia /ML syringes is required/requested.  Per Test Claim: $95 with pharmacy benefit   PA submitted on 11/21/22 to (ins) [employer] via CoverMyMeds Key or [facility]) confirmation # [id number] Status is pending

## 2022-11-21 NOTE — Telephone Encounter (Signed)
Please start new PA for Prolia.  Last inj was on 06/17/2022.  Please respond to Barnie Mort at [facility].  I am just filling in for the week.  Thank you!

## 2022-11-22 NOTE — Telephone Encounter (Signed)
Patient Advocate Encounter  Prior Authorization for Prolia  has been approved with [employer].    PA# [id number] Effective dates: 09/23/20 through 08/01/23

## 2022-11-22 NOTE — Telephone Encounter (Signed)
Pt ready for scheduling for Prolia on or after : 11/22/22  Out-of-pocket cost due at time of visit: $327  Primary: [employer] Prolia co-insurance: 20% Admin fee co-insurance: 20%  Secondary: --- Prolia co-insurance:  Admin fee co-insurance:   Medical Benefit Details: Date Benefits were checked: 11/17/22 Deductible: NO/ Coinsurance: 20%/ Admin Fee: 20%  Prior Auth: APPROVED PA# [id number]  Expiration Date: 08/01/2023    Pharmacy benefit: Copay $95 If patient wants fill through the pharmacy benefit please send prescription to: [employer], and include estimated need by date in rx notes. Pharmacy will ship medication directly to the office.  Patient NOT eligible for Prolia Copay Card. Copay Card can make patient's cost as little as $25. Link to apply: [email or web address]  ** This summary of benefits is an estimation of the patient's out-of-pocket cost. Exact cost may very based on individual plan coverage.

## 2022-11-22 NOTE — Telephone Encounter (Signed)
Prolia BIV routed to Joellen Thompson 

## 2022-11-29 NOTE — Telephone Encounter (Signed)
Left message to return call to our office.  Will need  Lab app  Lab order  Nurse visit  Prolia sent to human with deliver date

## 2022-12-02 ENCOUNTER — Other Ambulatory Visit: Payer: Self-pay | Admitting: Family Medicine

## 2022-12-02 DIAGNOSIS — J439 Emphysema, unspecified: Secondary | ICD-10-CM

## 2022-12-02 NOTE — Telephone Encounter (Signed)
ERx 

## 2022-12-02 NOTE — Telephone Encounter (Signed)
Symbicort Last filled:  09/26/22, #1 ea Last OV:  06/17/22, 6 mo emphysema f/u Next OV:  01/02/23, CPE

## 2022-12-03 ENCOUNTER — Other Ambulatory Visit: Payer: Self-pay | Admitting: Family Medicine

## 2022-12-03 DIAGNOSIS — I10 Essential (primary) hypertension: Secondary | ICD-10-CM

## 2022-12-05 ENCOUNTER — Other Ambulatory Visit: Payer: Self-pay

## 2022-12-05 MED ORDER — DENOSUMAB 60 MG/ML ~~LOC~~ SOSY: 60.0000 mg | PREFILLED_SYRINGE | Freq: Once | SUBCUTANEOUS | 0 refills | Status: AC

## 2022-12-22 ENCOUNTER — Other Ambulatory Visit: Payer: Self-pay | Admitting: Family Medicine

## 2022-12-22 DIAGNOSIS — E559 Vitamin D deficiency, unspecified: Secondary | ICD-10-CM

## 2022-12-22 DIAGNOSIS — D509 Iron deficiency anemia, unspecified: Secondary | ICD-10-CM

## 2022-12-22 DIAGNOSIS — E785 Hyperlipidemia, unspecified: Secondary | ICD-10-CM

## 2022-12-22 DIAGNOSIS — E538 Deficiency of other specified B group vitamins: Secondary | ICD-10-CM

## 2022-12-23 ENCOUNTER — Telehealth: Payer: Self-pay

## 2022-12-23 ENCOUNTER — Other Ambulatory Visit (INDEPENDENT_AMBULATORY_CARE_PROVIDER_SITE_OTHER): Payer: Medicare HMO

## 2022-12-23 DIAGNOSIS — E538 Deficiency of other specified B group vitamins: Secondary | ICD-10-CM | POA: Diagnosis not present

## 2022-12-23 DIAGNOSIS — E785 Hyperlipidemia, unspecified: Secondary | ICD-10-CM

## 2022-12-23 DIAGNOSIS — D509 Iron deficiency anemia, unspecified: Secondary | ICD-10-CM | POA: Diagnosis not present

## 2022-12-23 DIAGNOSIS — E559 Vitamin D deficiency, unspecified: Secondary | ICD-10-CM | POA: Diagnosis not present

## 2022-12-23 LAB — COMPREHENSIVE METABOLIC PANEL
ALT: 14 U/L (ref 0–35)
AST: 7 U/L (ref 0–37)
Albumin: 4.2 g/dL (ref 3.5–5.2)
Alkaline Phosphatase: 69 U/L (ref 39–117)
BUN: 26 mg/dL — ABNORMAL HIGH (ref 6–23)
CO2: 30 mEq/L (ref 19–32)
Calcium: 9.6 mg/dL (ref 8.4–10.5)
Chloride: 102 mEq/L (ref 96–112)
Creatinine, Ser: 0.96 mg/dL (ref 0.40–1.20)
GFR: 57.94 mL/min — ABNORMAL LOW (ref >60.00)
Glucose, Bld: 88 mg/dL (ref 70–99)
Potassium: 3.8 mEq/L (ref 3.5–5.1)
Sodium: 141 mEq/L (ref 135–145)
Total Bilirubin: 0.4 mg/dL (ref 0.2–1.2)
Total Protein: 6.5 g/dL (ref 6.0–8.3)

## 2022-12-23 LAB — CBC WITH DIFFERENTIAL/PLATELET
Basophils Absolute: 0.1 10*3/uL (ref 0.0–0.1)
Basophils Relative: 1.2 % (ref 0.0–3.0)
Eosinophils Absolute: 0.3 10*3/uL (ref 0.0–0.7)
Eosinophils Relative: 5.4 % — ABNORMAL HIGH (ref 0.0–5.0)
HCT: 37 % (ref 36.0–46.0)
Hemoglobin: 12.1 g/dL (ref 12.0–15.0)
Lymphocytes Relative: 25.6 % (ref 12.0–46.0)
Lymphs Abs: 1.4 10*3/uL (ref 0.7–4.0)
MCHC: 32.6 g/dL (ref 30.0–36.0)
MCV: 91.7 fl (ref 78.0–100.0)
Monocytes Absolute: 0.6 10*3/uL (ref 0.1–1.0)
Monocytes Relative: 11.1 % (ref 3.0–12.0)
Neutro Abs: 3.2 10*3/uL (ref 1.4–7.7)
Neutrophils Relative %: 56.7 % (ref 43.0–77.0)
Platelets: 242 10*3/uL (ref 150.0–400.0)
RBC: 4.04 Mil/uL (ref 3.87–5.11)
RDW: 13.2 % (ref 11.5–15.5)
WBC: 5.6 10*3/uL (ref 4.0–10.5)

## 2022-12-23 LAB — IBC PANEL
Iron: 75 ug/dL (ref 42–145)
Saturation Ratios: 22.8 % (ref 20.0–50.0)
TIBC: 329 ug/dL (ref 250.0–450.0)
Transferrin: 235 mg/dL (ref 212.0–360.0)

## 2022-12-23 LAB — LIPID PANEL
Cholesterol: 173 mg/dL (ref 0–200)
HDL: 75.9 mg/dL (ref >39.00)
LDL Cholesterol: 83 mg/dL (ref 0–99)
NonHDL: 97.03
Total CHOL/HDL Ratio: 2
Triglycerides: 68 mg/dL (ref 0.0–149.0)
VLDL: 13.6 mg/dL (ref 0.0–40.0)

## 2022-12-23 LAB — FERRITIN: Ferritin: 67.7 ng/mL (ref 10.0–291.0)

## 2022-12-23 LAB — VITAMIN D 25 HYDROXY (VIT D DEFICIENCY, FRACTURES): VITD: 111.64 ng/mL (ref 30.00–100.00)

## 2022-12-23 LAB — VITAMIN B12: Vitamin B-12: >1500 pg/mL — ABNORMAL HIGH (ref 211–911)

## 2022-12-23 NOTE — Telephone Encounter (Signed)
Plz notify vit D is now too high. Stop vit D 50k units weekly. Stop vit D 1000 units daily.  In 2 weeks may restart vit D 1000 units daily.  We will reassess with rpt labs in a few months.

## 2022-12-23 NOTE — Telephone Encounter (Signed)
Noted  

## 2022-12-23 NOTE — Telephone Encounter (Signed)
Lvm asking pt to call back.  Need to relay Dr. G's message.  

## 2022-12-23 NOTE — Telephone Encounter (Signed)
Patient returned call.I relayed Dr Timoteo Expose message to her,and she understood and okayed everything regarding her vitamin D.

## 2022-12-23 NOTE — Telephone Encounter (Signed)
Hope with [facility] lab called critical lab report for Vitamin D 111.64. not in epic sending to Dr Esau Grew pool and will speak with LIsa CMA/.report in lab notebook.

## 2022-12-30 ENCOUNTER — Encounter: Payer: Medicare HMO | Admitting: Family Medicine

## 2023-01-02 ENCOUNTER — Ambulatory Visit (INDEPENDENT_AMBULATORY_CARE_PROVIDER_SITE_OTHER): Payer: Medicare HMO | Admitting: Family Medicine

## 2023-01-02 ENCOUNTER — Encounter: Payer: Self-pay | Admitting: Family Medicine

## 2023-01-02 VITALS — BP 120/76 | HR 107 | Temp 97.6°F | Ht <= 58 in | Wt 117.0 lb

## 2023-01-02 DIAGNOSIS — M810 Age-related osteoporosis without current pathological fracture: Secondary | ICD-10-CM

## 2023-01-02 DIAGNOSIS — E559 Vitamin D deficiency, unspecified: Secondary | ICD-10-CM | POA: Diagnosis not present

## 2023-01-02 DIAGNOSIS — I10 Essential (primary) hypertension: Secondary | ICD-10-CM

## 2023-01-02 DIAGNOSIS — J439 Emphysema, unspecified: Secondary | ICD-10-CM

## 2023-01-02 DIAGNOSIS — E785 Hyperlipidemia, unspecified: Secondary | ICD-10-CM | POA: Diagnosis not present

## 2023-01-02 DIAGNOSIS — F4321 Adjustment disorder with depressed mood: Secondary | ICD-10-CM | POA: Diagnosis not present

## 2023-01-02 DIAGNOSIS — F5101 Primary insomnia: Secondary | ICD-10-CM

## 2023-01-02 DIAGNOSIS — Z0000 Encounter for general adult medical examination without abnormal findings: Secondary | ICD-10-CM | POA: Diagnosis not present

## 2023-01-02 DIAGNOSIS — R251 Tremor, unspecified: Secondary | ICD-10-CM

## 2023-01-02 DIAGNOSIS — K222 Esophageal obstruction: Secondary | ICD-10-CM

## 2023-01-02 DIAGNOSIS — I700 Atherosclerosis of aorta: Secondary | ICD-10-CM

## 2023-01-02 DIAGNOSIS — Z7189 Other specified counseling: Secondary | ICD-10-CM

## 2023-01-02 DIAGNOSIS — E538 Deficiency of other specified B group vitamins: Secondary | ICD-10-CM

## 2023-01-02 DIAGNOSIS — K259 Gastric ulcer, unspecified as acute or chronic, without hemorrhage or perforation: Secondary | ICD-10-CM

## 2023-01-02 DIAGNOSIS — D509 Iron deficiency anemia, unspecified: Secondary | ICD-10-CM

## 2023-01-02 MED ORDER — BUDESONIDE-FORMOTEROL FUMARATE 80-4.5 MCG/ACT IN AERO: 2.0000 | INHALATION_SPRAY | Freq: Two times a day (BID) | RESPIRATORY_TRACT | 11 refills | Status: DC

## 2023-01-02 MED ORDER — BUPROPION HCL ER (SR) 100 MG PO TB12: 100.0000 mg | ORAL_TABLET | Freq: Every day | ORAL | 4 refills | Status: DC

## 2023-01-02 MED ORDER — ATORVASTATIN CALCIUM 10 MG PO TABS: 10.0000 mg | ORAL_TABLET | Freq: Every day | ORAL | 4 refills | Status: DC

## 2023-01-02 MED ORDER — LISINOPRIL-HYDROCHLOROTHIAZIDE 10-12.5 MG PO TABS
1.0000 | ORAL_TABLET | Freq: Every day | ORAL | 4 refills | Status: DC
Start: 2023-01-02 — End: 2024-05-01

## 2023-01-02 MED ORDER — PROPRANOLOL HCL 10 MG PO TABS: 10.0000 mg | ORAL_TABLET | Freq: Two times a day (BID) | ORAL | 6 refills | Status: DC

## 2023-01-02 MED ORDER — DULOXETINE HCL 60 MG PO CPEP: 60.0000 mg | ORAL_CAPSULE | Freq: Every day | ORAL | 4 refills | Status: DC

## 2023-01-02 NOTE — Assessment & Plan Note (Signed)
Severe, stable period on symbicort controller inhaler 2 puffs in am with PM dosing PRN, no recent albuterol need. Continue current regimen. Consider updated spirometry once available.

## 2023-01-02 NOTE — Assessment & Plan Note (Signed)
Continue atorvastatin

## 2023-01-02 NOTE — Assessment & Plan Note (Signed)
Chronic, stable on lisinopril/hctz - continue 

## 2023-01-02 NOTE — Patient Instructions (Addendum)
As vitamin levels were high (B12 and D), hold supplementation at this time.  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 Continue other medicines for now.  If interested, check with pharmacy about new 2 shot shingles series (shingrix).  Good to see you today.  Return in 6 months for follow up visit , sooner if needed

## 2023-01-02 NOTE — Assessment & Plan Note (Signed)
S/p dilation on EGD 09/2022. This was likely causing dysphagia - now better. She has also been started on daily PPI

## 2023-01-02 NOTE — Assessment & Plan Note (Signed)
Preventative protocols reviewed and updated unless pt declined. Discussed healthy diet and lifestyle.  

## 2023-01-02 NOTE — Assessment & Plan Note (Signed)
Chronic, stable on low dose atorvastatin 10mg .  The 10-year ASCVD risk score (Arnett DK, et al., 2019) is: 18.6%   Values used to calculate the score:     Age: 78 years     Sex: Female     Is Non-Hispanic African American: No     Diabetic: No     Tobacco smoker: No     Systolic Blood Pressure: 120 mmHg     Is BP treated: Yes     HDL Cholesterol: 75.9 mg/dL     Total Cholesterol: 173 mg/dL

## 2023-01-02 NOTE — Assessment & Plan Note (Signed)
Noted on EGD.  Now on daily PPI.  Appreciate GI care.  This likely contributed to prior IDA that is largely better after iron infusion. Rpt labs at 6 month f/u visit.

## 2023-01-02 NOTE — Assessment & Plan Note (Signed)
Most consistent with essential tremor, sounds like mother had similar issue.  Will drop wellbutrin SR to 100mg  once daily.  Pending effect of lower dose, will also start propranolol 10mg  BID PRN, watching for hypotension.

## 2023-01-02 NOTE — Assessment & Plan Note (Signed)
Chronic, continues ambien 1/2 tab nightly.

## 2023-01-02 NOTE — Assessment & Plan Note (Addendum)
Levels high - so have stopped all replacement.

## 2023-01-02 NOTE — Assessment & Plan Note (Signed)
Levels high - so have stopped replacement.

## 2023-01-02 NOTE — Assessment & Plan Note (Signed)
Advanced directives: brings copy which will be scanned 12/2022. wants advanced directive followed. No prolonged life support if terminal condition. HCPOA is daughter Courtney Thomas.

## 2023-01-02 NOTE — Assessment & Plan Note (Signed)
Recent EGD completed, needs to repeat colonoscopy due to insufficient prep (scheduled for 03/2023).  IDA has reversed after Venofer iron infusion x5.  She also continues oral iron tablet daily.

## 2023-01-02 NOTE — Assessment & Plan Note (Signed)
Chronic, stable period on cymbalta 60mg  daily and wellbutrin SR 150mg  daily. Will trial lower wellbutrin dose to see effect on tremor as per below.

## 2023-01-02 NOTE — Progress Notes (Signed)
Ph: [phone] Fax: [phone]   Patient ID: Courtney Thomas, female    DOB: 19-Aug-1945, 77 y.o.   MRN: [id number]  This visit was conducted in person.  BP 120/76   Pulse (!) 107   Temp 97.6 F (36.4 C) (Temporal)   Ht 4\' 10"  (1.473 m)   Wt 117 lb (53.1 kg)   SpO2 96%   BMI 24.45 kg/m    CC: CPE Subjective:   HPI: Courtney Thomas is a 77 y.o. female presenting on 01/02/2023 for Annual Exam (Labs printed )   Saw health advisor 08/2022 for medicare wellness visit. Note reviewed.   No results found.  Flowsheet Row Office Visit from 01/02/2023 in [facility]  PHQ-2 Total Score 0          01/02/2023   12:26 PM 08/30/2022    9:27 AM 11/30/2021    4:07 PM 08/27/2021    8:58 AM 08/26/2020    9:13 AM  Fall Risk   Falls in the past year? 0 0 0 0 0  Number falls in past yr: 0 0 0 0 0  Injury with Fall? 0 0  0 0  Risk for fall due to : No Fall Risks No Fall Risks  No Fall Risks Medication side effect  Follow up Falls evaluation completed Falls prevention discussed;Falls evaluation completed  Falls prevention discussed Falls evaluation completed;Falls prevention discussed   Marked microcytic iron deficiency anemia s/p iron infusion Venofer x5 (08/2022) Last B12 injection 01/2022.  Vit D recently elevated - now off vit D replacement.   COLONOSCOPY 09/22/2022 - poor prep, rpt 6 mo Timothy Lasso) ESOPHAGOGASTRODUODENOSCOPY 09/22/2022 - non-bleeding gastric ulcers with clean base, duodenal dvierticulum, schatzki ring dilated, biopsy with mild chronic gastritis, H pylori neg Timothy Lasso)   Emphysema by imaging study - managed with symbicort 80.4. 2 puffs in am with 2 puffs PM if needed. Spirometry not currently available at our office.   Notes worsening tremor - predominantly action tremor. Present for years but worsening. No memory difficulty, gait abnormality, no trouble sleeping. + fmhx essential tremor (mother).   Preventative: Colonoscopy - 07/2013 WNL  10 yr f/u ([place]) COLONOSCOPY 09/22/2022 - poor prep, rpt 6 mo Timothy Lasso) Well woman - with GYN, last pap smear 01/14/17 WNL. S/p hysterectomy and BSO.  Mammogram 10/2022- Birads1 @ [place]  DEXA 11/2021 - T -3.0 spine, -3.2 L forearm  DEXA 06/2019 T -2 hip, -3.4 forearm. 05/2016 T -3.5 --> 3.3 spine, -2.0 --> -1.7 hip, on fosamax from 03/2014 through 01/14/2021, prolia started then, last injection 06/2022.  Egg free flu shot yearly.  COVID vaccine Pfizer 09/2019 x2, 05/2020 booster Prevnar-13 01/15/2016. Pneumovax 14-Jan-2017.  Tdap 01-15-2012 with bad reaction  Shingrix - discussed, to consider Advanced directives: brings copy which will be scanned Jan 15, 2023. wants advanced directive followed. No prolonged life support if terminal condition. HCPOA is daughter Verna Czech.  Seat belt use discussed Sunscreen use discussed. No changing moles on skin. Seeing derm.  Non smoker, some second hand smoker Alcohol - none  Dentist Q6 mo - needs to find new dentist  Eye exam - due Bowel - no constipation Bladder - no incontinence  Lives alone - husband passed away 14-Jan-2014. LBD + parkinson disease Started seeing new partner 14-Jan-2017.  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 Interim medical history since our last visit reviewed. Allergies and medications reviewed and updated. Outpatient Medications Prior to Visit  Medication Sig Dispense Refill   albuterol (VENTOLIN HFA) 108 (90 Base) MCG/ACT inhaler Inhale 2 puffs into the lungs every 6 (six) hours as needed for wheezing or shortness of breath. 16 g 3   Iron, Ferrous Sulfate, 325 (65 Fe) MG TABS Take 325 mg by mouth daily.     naproxen sodium (ALEVE) 220 MG tablet Take 220 mg by mouth.     pantoprazole (PROTONIX) 40 MG tablet Take by mouth.     PROLIA 60 MG/ML SOSY injection  Inject into the skin.     zolpidem (AMBIEN) 10 MG tablet TAKE 1/2 TO 1 TABLET AT BEDTIME AS NEEDED FOR SLEEP 30 tablet 3   atorvastatin (LIPITOR) 10 MG tablet Take 1 tablet (10 mg total) by mouth daily. 90 tablet 0   budesonide-formoterol (SYMBICORT) 80-4.5 MCG/ACT inhaler INHALE 2 PUFFS INTO THE LUNGS 2 TIMES DAILY 10.2 g 3   buPROPion (WELLBUTRIN SR) 150 MG 12 hr tablet TAKE 1 TABLET BY MOUTH DAILY 90 tablet 0   Cyanocobalamin (VITAMIN B-12) 1000 MCG SUBL Place 1 tablet under the tongue daily. OTC     DULoxetine (CYMBALTA) 60 MG capsule TAKE 1 CAPSULE BY MOUTH ONCE DAILY 90 capsule 0   lisinopril-hydrochlorothiazide (ZESTORETIC) 10-12.5 MG tablet TAKE 1 TABLET BY MOUTH DAILY 90 tablet 0   No facility-administered medications prior to visit.     Per HPI unless specifically indicated in ROS section below Review of Systems  Constitutional:  Negative for activity change, appetite change, chills, fatigue, fever and unexpected weight change.  HENT:  Negative for hearing loss.   Eyes:  Negative for visual disturbance.  Respiratory:  Positive for cough. Negative for chest tightness, shortness of breath and wheezing.   Cardiovascular:  Negative for chest pain, palpitations and leg swelling.  Gastrointestinal:  Negative for abdominal distention, abdominal pain, blood in stool, constipation, diarrhea, nausea and vomiting.  Genitourinary:  Negative for difficulty urinating and hematuria.  Musculoskeletal:  Negative for arthralgias, myalgias and neck pain.  Skin:  Negative for rash.  Neurological:  Negative for dizziness, seizures, syncope and headaches.  Hematological:  Negative for adenopathy. Does not bruise/bleed easily.  Psychiatric/Behavioral:  Negative for dysphoric mood. The patient is not nervous/anxious.     Objective:  BP 120/76   Pulse (!) 107   Temp 97.6 F (36.4 C) (Temporal)   Ht 4\' 10"  (1.473 m)   Wt 117 lb (53.1 kg)   SpO2 96%   BMI 24.45 kg/m   Wt Readings from Last 3  Encounters:  01/02/23 117 lb (53.1 kg)  09/22/22 115 lb 12.8 oz (52.5 kg)  08/30/22 116 lb (52.6 kg)      Physical Exam Vitals and nursing note reviewed.  Constitutional:      Appearance: Normal appearance. She is not ill-appearing.  HENT:     Head: Normocephalic and atraumatic.     Right Ear: Tympanic membrane, ear canal and external ear normal. There is no impacted cerumen.     Left Ear: Tympanic membrane, ear canal and external ear normal. There is no impacted cerumen.     Nose: Nose normal.     Mouth/Throat:     Mouth: Mucous membranes are moist.     Pharynx: Oropharynx is clear. No oropharyngeal exudate or posterior oropharyngeal erythema.  Eyes:     General:  Right eye: No discharge.        Left eye: No discharge.     Extraocular Movements: Extraocular movements intact.     Conjunctiva/sclera: Conjunctivae normal.     Pupils: Pupils are equal, round, and reactive to light.  Neck:     Thyroid: No thyroid mass or thyromegaly.     Vascular: No carotid bruit.  Cardiovascular:     Rate and Rhythm: Normal rate and regular rhythm.     Pulses: Normal pulses.     Heart sounds: Normal heart sounds. No murmur heard. Pulmonary:     Effort: Pulmonary effort is normal. No respiratory distress.     Breath sounds: Normal breath sounds. No wheezing, rhonchi or rales.  Abdominal:     General: Bowel sounds are normal. There is no distension.     Palpations: Abdomen is soft. There is no mass.     Tenderness: There is no abdominal tenderness. There is no guarding or rebound.     Hernia: No hernia is present.  Musculoskeletal:     Cervical back: Normal range of motion and neck supple. No rigidity.     Right lower leg: No edema.     Left lower leg: No edema.  Lymphadenopathy:     Cervical: No cervical adenopathy.  Skin:    General: Skin is warm and dry.     Findings: No rash.  Neurological:     General: No focal deficit present.     Mental Status: She is alert. Mental status  is at baseline.     Comments:  Action tremor present more noticeable with outstretched hands No resting tremor No shuffling gait, no masked facies, no rigidity appreciated  Psychiatric:        Mood and Affect: Mood normal.        Behavior: Behavior normal.       Results for orders placed or performed in visit on 12/23/22  CBC with Differential/Platelet  Result Value Ref Range   WBC 5.6 4.0 - 10.5 K/uL   RBC 4.04 3.87 - 5.11 Mil/uL   Hemoglobin 12.1 12.0 - 15.0 g/dL   HCT 21.3 08.6 - 57.8 %   MCV 91.7 78.0 - 100.0 fl   MCHC 32.6 30.0 - 36.0 g/dL   RDW 46.9 62.9 - 52.8 %   Platelets 242.0 150.0 - 400.0 K/uL   Neutrophils Relative % 56.7 43.0 - 77.0 %   Lymphocytes Relative 25.6 12.0 - 46.0 %   Monocytes Relative 11.1 3.0 - 12.0 %   Eosinophils Relative 5.4 (H) 0.0 - 5.0 %   Basophils Relative 1.2 0.0 - 3.0 %   Neutro Abs 3.2 1.4 - 7.7 K/uL   Lymphs Abs 1.4 0.7 - 4.0 K/uL   Monocytes Absolute 0.6 0.1 - 1.0 K/uL   Eosinophils Absolute 0.3 0.0 - 0.7 K/uL   Basophils Absolute 0.1 0.0 - 0.1 K/uL  VITAMIN D 25 Hydroxy (Vit-D Deficiency, Fractures)  Result Value Ref Range   VITD 111.64 (HH) 30.00 - 100.00 ng/mL  IBC panel  Result Value Ref Range   Iron 75 42 - 145 ug/dL   Transferrin 413.2 440.1 - 360.0 mg/dL   Saturation Ratios 02.7 20.0 - 50.0 %   TIBC 329.0 250.0 - 450.0 mcg/dL  Ferritin  Result Value Ref Range   Ferritin 67.7 10.0 - 291.0 ng/mL  Vitamin B12  Result Value Ref Range   Vitamin B-12 >1500 (H) 211 - 911 pg/mL  Comprehensive metabolic panel  Result Value Ref  Range   Sodium 141 135 - 145 mEq/L   Potassium 3.8 3.5 - 5.1 mEq/L   Chloride 102 96 - 112 mEq/L   CO2 30 19 - 32 mEq/L   Glucose, Bld 88 70 - 99 mg/dL   BUN 26 (H) 6 - 23 mg/dL   Creatinine, Ser 1.61 0.40 - 1.20 mg/dL   Total Bilirubin 0.4 0.2 - 1.2 mg/dL   Alkaline Phosphatase 69 39 - 117 U/L   AST 7 0 - 37 U/L   ALT 14 0 - 35 U/L   Total Protein 6.5 6.0 - 8.3 g/dL   Albumin 4.2 3.5 - 5.2 g/dL    GFR 09.60 (L) >45.40 mL/min   Calcium 9.6 8.4 - 10.5 mg/dL  Lipid panel  Result Value Ref Range   Cholesterol 173 0 - 200 mg/dL   Triglycerides 98.1 0.0 - 149.0 mg/dL   HDL 19.14 >78.29 mg/dL   VLDL 56.2 0.0 - 13.0 mg/dL   LDL Cholesterol 83 0 - 99 mg/dL   Total CHOL/HDL Ratio 2    NonHDL 97.03     Assessment & Plan:   Problem List Items Addressed This Visit     Advanced care planning/counseling discussion (Chronic)    Advanced directives: brings copy which will be scanned 12/2022. wants advanced directive followed. No prolonged life support if terminal condition. HCPOA is daughter Verna Czech.       Health care maintenance - Primary (Chronic)    Preventative protocols reviewed and updated unless pt declined. Discussed healthy diet and lifestyle.       Hypertension    Chronic, stable on lisinopril/hctz - continue.       Relevant Medications   propranolol (INDERAL) 10 MG tablet   atorvastatin (LIPITOR) 10 MG tablet   lisinopril-hydrochlorothiazide (ZESTORETIC) 10-12.5 MG tablet   Hyperlipidemia    Chronic, stable on low dose atorvastatin 10mg .  The 10-year ASCVD risk score (Arnett DK, et al., 2019) is: 18.6%   Values used to calculate the score:     Age: 16 years     Sex: Female     Is Non-Hispanic African American: No     Diabetic: No     Tobacco smoker: No     Systolic Blood Pressure: 120 mmHg     Is BP treated: Yes     HDL Cholesterol: 75.9 mg/dL     Total Cholesterol: 173 mg/dL       Relevant Medications   propranolol (INDERAL) 10 MG tablet   atorvastatin (LIPITOR) 10 MG tablet   lisinopril-hydrochlorothiazide (ZESTORETIC) 10-12.5 MG tablet   Insomnia    Chronic, continues ambien 1/2 tab nightly.       Adjustment disorder with depressed mood    Chronic, stable period on cymbalta 60mg  daily and wellbutrin SR 150mg  daily. Will trial lower wellbutrin dose to see effect on tremor as per below.       Vitamin B12 deficiency    Levels high - so have  stopped replacement.      Vitamin D deficiency    Levels high - so have stopped all replacement.      Osteoporosis    Chronic, continues prolia Q6 mo. Due for rpt injection - she will contact pharmacy about getting new dose sent to [place] for injection.       Relevant Medications   PROLIA 60 MG/ML SOSY injection   Tremor    Most consistent with essential tremor, sounds like mother had similar issue.  Will drop wellbutrin SR  to 100mg  once daily.  Pending effect of lower dose, will also start propranolol 10mg  BID PRN, watching for hypotension.       Thoracic aorta atherosclerosis (HCC)    Continue atorvastatin.       Relevant Medications   propranolol (INDERAL) 10 MG tablet   atorvastatin (LIPITOR) 10 MG tablet   lisinopril-hydrochlorothiazide (ZESTORETIC) 10-12.5 MG tablet   Emphysema of lung (HCC)    Severe, stable period on symbicort controller inhaler 2 puffs in am with PM dosing PRN, no recent albuterol need. Continue current regimen. Consider updated spirometry once available.       Relevant Medications   budesonide-formoterol (SYMBICORT) 80-4.5 MCG/ACT inhaler   IDA (iron deficiency anemia)    Recent EGD completed, needs to repeat colonoscopy due to insufficient prep (scheduled for 03/2023).  IDA has reversed after Venofer iron infusion x5.  She also continues oral iron tablet daily.       Schatzki's ring    S/p dilation on EGD 09/2022. This was likely causing dysphagia - now better. She has also been started on daily PPI      Gastric ulcer    Noted on EGD.  Now on daily PPI.  Appreciate GI care.  This likely contributed to prior IDA that is largely better after iron infusion. Rpt labs at 6 month f/u visit.         Meds ordered this encounter  Medications   buPROPion (WELLBUTRIN SR) 100 MG 12 hr tablet    Sig: Take 1 tablet (100 mg total) by mouth daily.    Dispense:  90 tablet    Refill:  4    Note new dose   propranolol (INDERAL) 10 MG tablet    Sig:  Take 1 tablet (10 mg total) by mouth 2 (two) times daily.    Dispense:  60 tablet    Refill:  6   atorvastatin (LIPITOR) 10 MG tablet    Sig: Take 1 tablet (10 mg total) by mouth daily.    Dispense:  90 tablet    Refill:  4   budesonide-formoterol (SYMBICORT) 80-4.5 MCG/ACT inhaler    Sig: Inhale 2 puffs into the lungs 2 (two) times daily.    Dispense:  10.2 g    Refill:  11   DULoxetine (CYMBALTA) 60 MG capsule    Sig: Take 1 capsule (60 mg total) by mouth daily.    Dispense:  90 capsule    Refill:  4   lisinopril-hydrochlorothiazide (ZESTORETIC) 10-12.5 MG tablet    Sig: Take 1 tablet by mouth daily.    Dispense:  90 tablet    Refill:  4    No orders of the defined types were placed in this encounter.   Patient Instructions  As vitamin levels were high (B12 and D), hold supplementation at this time.  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 Continue other medicines for now.  If interested, check with pharmacy about new 2 shot shingles series (shingrix).  Good to see you today.  Return in 6 months for follow up visit , sooner if needed  Follow up plan: Return in about 6 months (around 07/04/2023) for follow up visit.  Eustaquio Boyden, MD

## 2023-01-02 NOTE — Assessment & Plan Note (Signed)
Chronic, continues prolia Q6 mo. Due for rpt injection - she will contact pharmacy about getting new dose sent to [place] for injection.

## 2023-01-04 ENCOUNTER — Ambulatory Visit: Payer: Medicare HMO

## 2023-01-11 DIAGNOSIS — D2271 Melanocytic nevi of right lower limb, including hip: Secondary | ICD-10-CM | POA: Diagnosis not present

## 2023-01-11 DIAGNOSIS — L570 Actinic keratosis: Secondary | ICD-10-CM | POA: Diagnosis not present

## 2023-01-11 DIAGNOSIS — Z85828 Personal history of other malignant neoplasm of skin: Secondary | ICD-10-CM | POA: Diagnosis not present

## 2023-01-11 DIAGNOSIS — Z08 Encounter for follow-up examination after completed treatment for malignant neoplasm: Secondary | ICD-10-CM | POA: Diagnosis not present

## 2023-01-11 DIAGNOSIS — D2261 Melanocytic nevi of right upper limb, including shoulder: Secondary | ICD-10-CM | POA: Diagnosis not present

## 2023-01-11 DIAGNOSIS — L821 Other seborrheic keratosis: Secondary | ICD-10-CM | POA: Diagnosis not present

## 2023-01-11 DIAGNOSIS — D2262 Melanocytic nevi of left upper limb, including shoulder: Secondary | ICD-10-CM | POA: Diagnosis not present

## 2023-01-11 DIAGNOSIS — D2272 Melanocytic nevi of left lower limb, including hip: Secondary | ICD-10-CM | POA: Diagnosis not present

## 2023-01-11 DIAGNOSIS — D225 Melanocytic nevi of trunk: Secondary | ICD-10-CM | POA: Diagnosis not present

## 2023-05-16 ENCOUNTER — Other Ambulatory Visit: Payer: Self-pay | Admitting: Family Medicine

## 2023-05-16 DIAGNOSIS — F5101 Primary insomnia: Secondary | ICD-10-CM

## 2023-05-16 NOTE — Telephone Encounter (Signed)
LAST APPOINTMENT DATE: 01/02/2023   NEXT APPOINTMENT DATE:     LAST REFILL: 10/12/22  QTY: #30 3 rf

## 2023-05-17 ENCOUNTER — Ambulatory Visit (INDEPENDENT_AMBULATORY_CARE_PROVIDER_SITE_OTHER): Payer: Medicare HMO

## 2023-05-17 DIAGNOSIS — Z23 Encounter for immunization: Secondary | ICD-10-CM | POA: Diagnosis not present

## 2023-05-19 NOTE — Telephone Encounter (Signed)
ERx 

## 2023-07-12 ENCOUNTER — Other Ambulatory Visit: Payer: Self-pay | Admitting: Family Medicine

## 2023-07-12 DIAGNOSIS — F5101 Primary insomnia: Secondary | ICD-10-CM

## 2023-07-12 NOTE — Telephone Encounter (Signed)
Name of Medication: Ambien Name of Pharmacy: [facility] Last Fill or Written Date and Quantity: 05/19/23, #30 Last Office Visit and Type: 01/02/23, CPE Next Office Visit and Type: none Last Controlled Substance Agreement Date: none Last UDS: none

## 2023-07-12 NOTE — Telephone Encounter (Signed)
ERx 

## 2023-07-31 DIAGNOSIS — C44319 Basal cell carcinoma of skin of other parts of face: Secondary | ICD-10-CM | POA: Diagnosis not present

## 2023-08-22 ENCOUNTER — Other Ambulatory Visit: Payer: Self-pay | Admitting: Family Medicine

## 2023-08-22 DIAGNOSIS — F5101 Primary insomnia: Secondary | ICD-10-CM

## 2023-08-22 NOTE — Telephone Encounter (Signed)
Name of Medication: Ambien Name of Pharmacy: [facility] Last Fill or Written Date and Quantity: 07/12/23, #30 Last Office Visit and Type: 01/02/23, CPE Next Office Visit and Type: none Last Controlled Substance Agreement Date: none Last UDS: none

## 2023-08-24 NOTE — Telephone Encounter (Signed)
ERx 

## 2023-10-02 ENCOUNTER — Other Ambulatory Visit: Payer: Self-pay | Admitting: Family Medicine

## 2023-10-02 DIAGNOSIS — Z1231 Encounter for screening mammogram for malignant neoplasm of breast: Secondary | ICD-10-CM

## 2023-10-18 ENCOUNTER — Other Ambulatory Visit: Payer: Self-pay | Admitting: Family Medicine

## 2023-10-18 DIAGNOSIS — F5101 Primary insomnia: Secondary | ICD-10-CM

## 2023-10-18 NOTE — Telephone Encounter (Signed)
 ERx

## 2023-10-31 ENCOUNTER — Ambulatory Visit
Admission: RE | Admit: 2023-10-31 | Discharge: 2023-10-31 | Disposition: A | Discharge status: Home / Self Care | Source: Ambulatory Visit | Attending: Family Medicine | Admitting: Family Medicine

## 2023-10-31 DIAGNOSIS — Z1231 Encounter for screening mammogram for malignant neoplasm of breast: Secondary | ICD-10-CM | POA: Diagnosis not present

## 2023-11-29 ENCOUNTER — Ambulatory Visit: Payer: Self-pay

## 2023-11-29 NOTE — Telephone Encounter (Addendum)
 Pt scheduled appt for Friday am. Please continue trying to triage patient - if truly unable to keep any PO (vomiting) or concern for dehydration, would recommend ER eval.

## 2023-11-29 NOTE — Telephone Encounter (Addendum)
 Summary: Uncontrollable diarrhea.   Copied From CRM [id number]. Reason for Triage: Patient stated that she has had uncontrollable diarrhea for the past 4 days and has been wearing pull ups for that. She also stated that she cannot keep any food in.         11/29/23 1:26pm- 1st attempt, no answer, LVM  11/29/23 2:32pm- 2nd attemt, no answer, LVM  11/29/23 3:49pm- 3rd attempt, no answer, LVM, will route to clinic for f/u

## 2023-11-29 NOTE — Telephone Encounter (Signed)
 Lvm asking pt to call back. Needs OV with an available provider.   Also, lvm with emergency contact in pt's chart, asking them to have pt call us  back asap.

## 2023-11-30 NOTE — Telephone Encounter (Signed)
 Unable to reach pt by phone and left v/m requesting pt to cb [phone]. Sending to [facility] triage and G pool.

## 2023-11-30 NOTE — Telephone Encounter (Signed)
 Left v/m on pts cellphone requesting pt to cb for triage at [phone].Aaron Aas Unable to reach pt on home phone also. Sending note to [facility] triage.

## 2023-12-01 ENCOUNTER — Encounter: Payer: Self-pay | Admitting: Family Medicine

## 2023-12-01 ENCOUNTER — Telehealth: Payer: Self-pay | Admitting: Family Medicine

## 2023-12-01 ENCOUNTER — Telehealth: Payer: Self-pay

## 2023-12-01 ENCOUNTER — Other Ambulatory Visit: Payer: Self-pay

## 2023-12-01 ENCOUNTER — Ambulatory Visit (INDEPENDENT_AMBULATORY_CARE_PROVIDER_SITE_OTHER): Admitting: Family Medicine

## 2023-12-01 VITALS — BP 128/66 | HR 73 | Temp 98.0°F | Ht <= 58 in | Wt 108.0 lb

## 2023-12-01 DIAGNOSIS — M810 Age-related osteoporosis without current pathological fracture: Secondary | ICD-10-CM

## 2023-12-01 DIAGNOSIS — J439 Emphysema, unspecified: Secondary | ICD-10-CM | POA: Diagnosis not present

## 2023-12-01 DIAGNOSIS — A09 Infectious gastroenteritis and colitis, unspecified: Secondary | ICD-10-CM | POA: Diagnosis not present

## 2023-12-01 MED ORDER — ALBUTEROL SULFATE HFA 108 (90 BASE) MCG/ACT IN AERS: 2.0000 | INHALATION_SPRAY | Freq: Four times a day (QID) | RESPIRATORY_TRACT | 3 refills | Status: DC | PRN

## 2023-12-01 MED ORDER — DENOSUMAB 60 MG/ML ~~LOC~~ SOSY: 60.0000 mg | PREFILLED_SYRINGE | Freq: Once | SUBCUTANEOUS | Status: AC

## 2023-12-01 MED ORDER — ONDANSETRON HCL 4 MG PO TABS: 4.0000 mg | ORAL_TABLET | Freq: Three times a day (TID) | ORAL | 0 refills | Status: DC | PRN

## 2023-12-01 NOTE — Telephone Encounter (Signed)
 Order placed for benefits to be ran.  No further action needed at this time.

## 2023-12-01 NOTE — Patient Instructions (Addendum)
 Schedule physical for after June 3rd I'm glad you're feeling better! Let me know if any recurrent symptoms - diarrhea or vomiting.  Prescription printed for zofran  anti-nausea medicine to have if needed.  Push fluids and plenty of rest.  Try to choose fluids with electrolytes (gatorade, liquid IV, pedialyte, vitamin waters).  We will contact you regarding Prolia  cost.

## 2023-12-01 NOTE — Telephone Encounter (Signed)
 Prolia VOB initiated via [email or web address]

## 2023-12-01 NOTE — Telephone Encounter (Addendum)
 Per chart review pt has already seen Dr Mariam Shingles 12/01/23.

## 2023-12-01 NOTE — Progress Notes (Signed)
 Ph: [phone] Fax: [phone]   Patient ID: Courtney Thomas, female    DOB: 1945-08-24, 78 y.o.   MRN: [id number]  This visit was conducted in person.  BP 128/66   Pulse 73   Temp 98 F (36.7 C) (Oral)   Ht 4\' 10"  (1.473 m)   Wt 108 lb (49 kg)   SpO2 93%   BMI 22.57 kg/m    CC: diarrhea Subjective:   HPI: Courtney Thomas is a 78 y.o. female presenting on 12/01/2023 for Diarrhea (C/o diarrhea, nausea and vomiting. Started 11/23/23. Sxs stopped yesterday. )   Symptoms started on 11/23/2023. Nausea, vomiting, progressing to diarrhea. Uncontrollable watery diarrhea - had to buy diapers - 4x/day. Last vomiting was Saturday. Last diarrhea was yesterday morning. Managing with OTC imodium twice daily. Almost 10 lb weigh loss since last seen.   Low grade fever last week - Tmax 101.  Cramping abdominal pain.   Drank water, tea, popsicles, coke.  No appetite- wasn't eating well.   No recent antibiotic use. No new foods or restaurants.  No new medicines, supplements.  She did eat at [facility] in [place].  Uses city water.  No sick contacts.   No blood in the stool.   She continues aleve for headache, more recently.  She is not taking panotprazole at this time.      Relevant past medical, surgical, family and social history reviewed and updated as indicated. Interim medical history since our last visit reviewed. Allergies and medications reviewed and updated. Outpatient Medications Prior to Visit  Medication Sig Dispense Refill   atorvastatin  (LIPITOR) 10 MG tablet Take 1 tablet (10 mg total) by mouth daily. 90 tablet 4   budesonide -formoterol  (SYMBICORT ) 80-4.5 MCG/ACT inhaler Inhale 2 puffs into the lungs 2 (two) times daily. 10.2 g 11   buPROPion  (WELLBUTRIN  SR) 100 MG 12 hr tablet Take 1 tablet (100 mg total) by mouth daily. 90 tablet 4   DULoxetine  (CYMBALTA ) 60 MG capsule Take 1 capsule (60 mg total) by mouth daily. 90 capsule 4   Iron , Ferrous Sulfate , 325 (65  Fe) MG TABS Take 325 mg by mouth daily.     lisinopril -hydrochlorothiazide  (ZESTORETIC ) 10-12.5 MG tablet Take 1 tablet by mouth daily. 90 tablet 4   naproxen sodium (ALEVE) 220 MG tablet Take 220 mg by mouth.     pantoprazole (PROTONIX) 40 MG tablet Take by mouth.     PROLIA  60 MG/ML SOSY injection Inject into the skin.     propranolol  (INDERAL ) 10 MG tablet Take 1 tablet (10 mg total) by mouth 2 (two) times daily. 60 tablet 6   zolpidem  (AMBIEN ) 10 MG tablet TAKE 1/2 TO 1 TABLET BY MOUTH AT BEDTIMEAS NEEDED FOR SLEEP 30 tablet 0   albuterol  (VENTOLIN  HFA) 108 (90 Base) MCG/ACT inhaler Inhale 2 puffs into the lungs every 6 (six) hours as needed for wheezing or shortness of breath. 16 g 3   No facility-administered medications prior to visit.     Per HPI unless specifically indicated in ROS section below Review of Systems  Objective:  BP 128/66   Pulse 73   Temp 98 F (36.7 C) (Oral)   Ht 4\' 10"  (1.473 m)   Wt 108 lb (49 kg)   SpO2 93%   BMI 22.57 kg/m   Wt Readings from Last 3 Encounters:  12/01/23 108 lb (49 kg)  01/02/23 117 lb (53.1 kg)  09/22/22 115 lb 12.8 oz (52.5 kg)  Physical Exam Vitals and nursing note reviewed.  Constitutional:      Appearance: Normal appearance. She is not ill-appearing.  HENT:     Head: Normocephalic and atraumatic.     Mouth/Throat:     Mouth: Mucous membranes are moist.     Pharynx: Oropharynx is clear. No oropharyngeal exudate or posterior oropharyngeal erythema.  Eyes:     Extraocular Movements: Extraocular movements intact.     Conjunctiva/sclera: Conjunctivae normal.     Pupils: Pupils are equal, round, and reactive to light.  Cardiovascular:     Rate and Rhythm: Normal rate and regular rhythm.     Pulses: Normal pulses.     Heart sounds: Normal heart sounds. No murmur heard. Pulmonary:     Effort: Pulmonary effort is normal. No respiratory distress.     Breath sounds: No wheezing, rhonchi or rales.     Comments: Coarse  bibasilar crackles Abdominal:     General: Bowel sounds are normal. There is no distension.     Palpations: Abdomen is soft. There is no hepatomegaly, splenomegaly or mass.     Tenderness: There is abdominal tenderness (mild) in the epigastric area. There is no guarding or rebound. Negative signs include Murphy's sign.     Hernia: No hernia is present.  Musculoskeletal:     Right lower leg: No edema.     Left lower leg: No edema.  Skin:    General: Skin is warm and dry.     Findings: No rash.  Neurological:     Mental Status: She is alert.  Psychiatric:        Mood and Affect: Mood normal.        Behavior: Behavior normal.       Results for orders placed or performed in visit on 12/23/22  CBC with Differential/Platelet   Collection Time: 12/23/22  8:56 AM  Result Value Ref Range   WBC 5.6 4.0 - 10.5 K/uL   RBC 4.04 3.87 - 5.11 Mil/uL   Hemoglobin 12.1 12.0 - 15.0 g/dL   HCT 16.1 09.6 - 04.5 %   MCV 91.7 78.0 - 100.0 fl   MCHC 32.6 30.0 - 36.0 g/dL   RDW 40.9 81.1 - 91.4 %   Platelets 242.0 150.0 - 400.0 K/uL   Neutrophils Relative % 56.7 43.0 - 77.0 %   Lymphocytes Relative 25.6 12.0 - 46.0 %   Monocytes Relative 11.1 3.0 - 12.0 %   Eosinophils Relative 5.4 (H) 0.0 - 5.0 %   Basophils Relative 1.2 0.0 - 3.0 %   Neutro Abs 3.2 1.4 - 7.7 K/uL   Lymphs Abs 1.4 0.7 - 4.0 K/uL   Monocytes Absolute 0.6 0.1 - 1.0 K/uL   Eosinophils Absolute 0.3 0.0 - 0.7 K/uL   Basophils Absolute 0.1 0.0 - 0.1 K/uL  VITAMIN D  25 Hydroxy (Vit-D Deficiency, Fractures)   Collection Time: 12/23/22  8:56 AM  Result Value Ref Range   VITD 111.64 (HH) 30.00 - 100.00 ng/mL  IBC panel   Collection Time: 12/23/22  8:56 AM  Result Value Ref Range   Iron  75 42 - 145 ug/dL   Transferrin 782.9 562.1 - 360.0 mg/dL   Saturation Ratios 30.8 20.0 - 50.0 %   TIBC 329.0 250.0 - 450.0 mcg/dL  Ferritin   Collection Time: 12/23/22  8:56 AM  Result Value Ref Range   Ferritin 67.7 10.0 - 291.0 ng/mL  Vitamin  B12   Collection Time: 12/23/22  8:56 AM  Result Value Ref  Range   Vitamin B-12 >1500 (H) 211 - 911 pg/mL  Comprehensive metabolic panel   Collection Time: 12/23/22  8:56 AM  Result Value Ref Range   Sodium 141 135 - 145 mEq/L   Potassium 3.8 3.5 - 5.1 mEq/L   Chloride 102 96 - 112 mEq/L   CO2 30 19 - 32 mEq/L   Glucose, Bld 88 70 - 99 mg/dL   BUN 26 (H) 6 - 23 mg/dL   Creatinine, Ser 9.14 0.40 - 1.20 mg/dL   Total Bilirubin 0.4 0.2 - 1.2 mg/dL   Alkaline Phosphatase 69 39 - 117 U/L   AST 7 0 - 37 U/L   ALT 14 0 - 35 U/L   Total Protein 6.5 6.0 - 8.3 g/dL   Albumin 4.2 3.5 - 5.2 g/dL   GFR 78.29 (L) >56.21 mL/min   Calcium  9.6 8.4 - 10.5 mg/dL  Lipid panel   Collection Time: 12/23/22  8:56 AM  Result Value Ref Range   Cholesterol 173 0 - 200 mg/dL   Triglycerides 30.8 0.0 - 149.0 mg/dL   HDL 65.78 >46.96 mg/dL   VLDL 29.5 0.0 - 28.4 mg/dL   LDL Cholesterol 83 0 - 99 mg/dL   Total CHOL/HDL Ratio 2    NonHDL 97.03     Assessment & Plan:   Problem List Items Addressed This Visit     Osteoporosis   Overdue for prolia  last done 06/2022.  She agrees to get this priced out to decide on ongoing therapy.  Will order updated DEXA.       Relevant Orders   DG Bone Density   Emphysema of lung (HCC)   Stable period on symbicort . Albuterol  refilled.       Relevant Medications   albuterol  (VENTOLIN  HFA) 108 (90 Base) MCG/ACT inhaler   Acute infective gastroenteritis - Primary   Anticipate acute gastroenteritis. Longer than would be expected for simple viral AGE. ?bacterial component. However without blood in stool and symptoms have improved over the past 24 hours. Will monitor for now with continued supportive care. Discussed electrolyte replacement. Rx WASP Zofran  printed today. Update if any recurrent symptoms - low threshold to send stool testing r/o bacterial cause including C diff. Pt agrees with plan.         Meds ordered this encounter  Medications   ondansetron   (ZOFRAN ) 4 MG tablet    Sig: Take 1 tablet (4 mg total) by mouth every 8 (eight) hours as needed for nausea or vomiting.    Dispense:  20 tablet    Refill:  0   albuterol  (VENTOLIN  HFA) 108 (90 Base) MCG/ACT inhaler    Sig: Inhale 2 puffs into the lungs every 6 (six) hours as needed for wheezing or shortness of breath.    Dispense:  16 g    Refill:  3    Orders Placed This Encounter  Procedures   DG Bone Density    Standing Status:   Future    Expiration Date:   11/30/2024    Reason for Exam (SYMPTOM  OR DIAGNOSIS REQUIRED):   osteoporosis f/u    Preferred imaging location?:   [facility]    Patient Instructions  Schedule physical for after June 3rd I'm glad you're feeling better! Let me know if any recurrent symptoms - diarrhea or vomiting.  Prescription printed for zofran  anti-nausea medicine to have if needed.  Push fluids and plenty of rest.  Try to choose fluids with electrolytes (gatorade, liquid IV, pedialyte, vitamin  waters).  We will contact you regarding Prolia  cost.   Follow up plan: No follow-ups on file.  Claire Crick, MD

## 2023-12-01 NOTE — Assessment & Plan Note (Signed)
 Overdue for prolia  last done 06/2022.  She agrees to get this priced out to decide on ongoing therapy.  Will order updated DEXA.

## 2023-12-01 NOTE — Assessment & Plan Note (Signed)
 Anticipate acute gastroenteritis. Longer than would be expected for simple viral AGE. ?bacterial component. However without blood in stool and symptoms have improved over the past 24 hours. Will monitor for now with continued supportive care. Discussed electrolyte replacement. Rx WASP Zofran  printed today. Update if any recurrent symptoms - low threshold to send stool testing r/o bacterial cause including C diff. Pt agrees with plan.

## 2023-12-01 NOTE — Assessment & Plan Note (Signed)
 Stable period on symbicort . Albuterol  refilled.

## 2023-12-01 NOTE — Telephone Encounter (Signed)
 Can we re-price out prolia  for patient? Last injection was 06/2022.  Previously I believe she got prolia  from her pharmacy and brought in.

## 2023-12-04 ENCOUNTER — Other Ambulatory Visit: Payer: Self-pay | Admitting: Family Medicine

## 2023-12-04 DIAGNOSIS — F5101 Primary insomnia: Secondary | ICD-10-CM

## 2023-12-04 NOTE — Telephone Encounter (Signed)
 Name of Medication:  Ambien  Name of Pharmacy:  [facility] Last Fill or Written Date and Quantity: v 10/18/23, #30 Last Office Visit and Type:  12/01/23, diarrhea Next Office Visit and Type:  none Last Controlled Substance Agreement Date:  none Last UDS:  none

## 2023-12-06 NOTE — Telephone Encounter (Signed)
 ERx

## 2023-12-08 ENCOUNTER — Other Ambulatory Visit (HOSPITAL_COMMUNITY): Payer: Self-pay

## 2023-12-08 NOTE — Telephone Encounter (Signed)
 Pt ready for scheduling for PROLIA  on or after : 12/08/23  Option# 1: Buy/Bill (Office supplied medication)  Out-of-pocket cost due at time of clinic visit: $357 (FOLLOWING MEDICARE GUIDELINE)  Number of injection/visits approved: 2  Primary: [employer] Prolia  co-insurance: 20% Admin fee co-insurance: 20%  Secondary: --- Prolia  co-insurance:  Admin fee co-insurance:   Medical Benefit Details: Date Benefits were checked: 12/08/23 Deductible: NO/ Coinsurance: 20%/ Admin Fee: 20%  Prior Auth: APPROVED PA# [id number] Expiration Date: 09/23/20-07/31/24   # of doses approved: 2 ----------------------------------------------------------------------- Option# 2- Med Obtained from pharmacy:  Pharmacy benefit: Copay $793.31 (Paid to pharmacy) Admin Fee: 20% (Pay at clinic)  Prior Auth: N/A PA# Expiration Date:   # of doses approved:   If patient wants fill through the pharmacy benefit please send prescription to: [employer], and include estimated need by date in rx notes. Pharmacy will ship medication directly to the office.  Patient NOT eligible for Prolia  Copay Card. Copay Card can make patient's cost as little as $25. Link to apply: [email or web address]://[email or web address]  ** This summary of benefits is an estimation of the patient's out-of-pocket cost. Exact cost may very based on individual plan coverage.

## 2023-12-08 NOTE — Telephone Encounter (Signed)
    Medicare guidelines: 20% coinsurance, 20% admin fee

## 2023-12-08 NOTE — Telephone Encounter (Signed)
 PA submitted via CMM for buy and bill. Key: [id number]  No PA required for Pharmacy. Copay: $793.31

## 2023-12-29 ENCOUNTER — Emergency Department

## 2023-12-29 ENCOUNTER — Emergency Department
Admission: EM | Admit: 2023-12-29 | Discharge: 2023-12-29 | Disposition: A | Discharge status: Home / Self Care | Attending: Emergency Medicine | Admitting: Emergency Medicine

## 2023-12-29 DIAGNOSIS — I959 Hypotension, unspecified: Secondary | ICD-10-CM | POA: Diagnosis not present

## 2023-12-29 DIAGNOSIS — M4312 Spondylolisthesis, cervical region: Secondary | ICD-10-CM | POA: Diagnosis not present

## 2023-12-29 DIAGNOSIS — R42 Dizziness and giddiness: Secondary | ICD-10-CM | POA: Diagnosis present

## 2023-12-29 DIAGNOSIS — M50322 Other cervical disc degeneration at C5-C6 level: Secondary | ICD-10-CM | POA: Diagnosis not present

## 2023-12-29 DIAGNOSIS — R0902 Hypoxemia: Secondary | ICD-10-CM | POA: Diagnosis not present

## 2023-12-29 DIAGNOSIS — D649 Anemia, unspecified: Secondary | ICD-10-CM | POA: Insufficient documentation

## 2023-12-29 DIAGNOSIS — M47812 Spondylosis without myelopathy or radiculopathy, cervical region: Secondary | ICD-10-CM | POA: Diagnosis not present

## 2023-12-29 DIAGNOSIS — Y92410 Unspecified street and highway as the place of occurrence of the external cause: Secondary | ICD-10-CM | POA: Insufficient documentation

## 2023-12-29 DIAGNOSIS — R55 Syncope and collapse: Secondary | ICD-10-CM | POA: Diagnosis not present

## 2023-12-29 DIAGNOSIS — I10 Essential (primary) hypertension: Secondary | ICD-10-CM | POA: Insufficient documentation

## 2023-12-29 LAB — COMPREHENSIVE METABOLIC PANEL WITH GFR
ALT: 11 U/L (ref 0–44)
AST: 9 U/L — ABNORMAL LOW (ref 15–41)
Albumin: 3.6 g/dL (ref 3.5–5.0)
Alkaline Phosphatase: 74 U/L (ref 38–126)
Anion gap: 11 (ref 5–15)
BUN: 17 mg/dL (ref 8–23)
CO2: 24 mmol/L (ref 22–32)
Calcium: 9.1 mg/dL (ref 8.9–10.3)
Chloride: 102 mmol/L (ref 98–111)
Creatinine, Ser: 0.83 mg/dL (ref 0.44–1.00)
GFR, Estimated: >60 mL/min (ref >60)
Glucose, Bld: 94 mg/dL (ref 70–99)
Potassium: 3.3 mmol/L — ABNORMAL LOW (ref 3.5–5.1)
Sodium: 137 mmol/L (ref 135–145)
Total Bilirubin: 0.5 mg/dL (ref 0.0–1.2)
Total Protein: 6.8 g/dL (ref 6.5–8.1)

## 2023-12-29 LAB — CBC
HCT: 31.3 % — ABNORMAL LOW (ref 36.0–46.0)
Hemoglobin: 9.6 g/dL — ABNORMAL LOW (ref 12.0–15.0)
MCH: 24.4 pg — ABNORMAL LOW (ref 26.0–34.0)
MCHC: 30.7 g/dL (ref 30.0–36.0)
MCV: 79.6 fL — ABNORMAL LOW (ref 80.0–100.0)
Platelets: 274 10*3/uL (ref 150–400)
RBC: 3.93 MIL/uL (ref 3.87–5.11)
RDW: 15.3 % (ref 11.5–15.5)
WBC: 5.6 10*3/uL (ref 4.0–10.5)
nRBC: 0 % (ref 0.0–0.2)

## 2023-12-29 LAB — CBG MONITORING, ED: Glucose-Capillary: 107 mg/dL — ABNORMAL HIGH (ref 70–99)

## 2023-12-29 LAB — PROTIME-INR
INR: 1 (ref 0.8–1.2)
Prothrombin Time: 13.3 s (ref 11.4–15.2)

## 2023-12-29 LAB — TROPONIN I (HIGH SENSITIVITY): Troponin I (High Sensitivity): 3 ng/L (ref <18)

## 2023-12-29 NOTE — Discharge Instructions (Signed)
 Follow-up with your primary care provider.  Return to the ER for new, worsening, or persistent severe dizziness, feeling like you are going to pass out, actually passing out, or any other new or worsening symptoms that concern you.
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
## 2024-01-06 ENCOUNTER — Other Ambulatory Visit: Payer: Self-pay | Admitting: Family Medicine

## 2024-01-08 NOTE — Telephone Encounter (Signed)
 Has OV tomorrow. Will send refill at visit.

## 2024-01-09 ENCOUNTER — Ambulatory Visit (INDEPENDENT_AMBULATORY_CARE_PROVIDER_SITE_OTHER): Admitting: Family Medicine

## 2024-01-09 ENCOUNTER — Encounter: Payer: Self-pay | Admitting: Family Medicine

## 2024-01-09 VITALS — BP 134/64 | HR 79 | Temp 98.1°F | Ht <= 58 in | Wt 107.4 lb

## 2024-01-09 DIAGNOSIS — D509 Iron deficiency anemia, unspecified: Secondary | ICD-10-CM

## 2024-01-09 DIAGNOSIS — R413 Other amnesia: Secondary | ICD-10-CM | POA: Diagnosis not present

## 2024-01-09 DIAGNOSIS — R55 Syncope and collapse: Secondary | ICD-10-CM | POA: Diagnosis not present

## 2024-01-09 DIAGNOSIS — F4321 Adjustment disorder with depressed mood: Secondary | ICD-10-CM

## 2024-01-09 DIAGNOSIS — F5101 Primary insomnia: Secondary | ICD-10-CM | POA: Diagnosis not present

## 2024-01-09 LAB — IBC PANEL
Iron: 29 ug/dL — ABNORMAL LOW (ref 42–145)
Saturation Ratios: 6 % — ABNORMAL LOW (ref 20.0–50.0)
TIBC: 485.8 ug/dL — ABNORMAL HIGH (ref 250.0–450.0)
Transferrin: 347 mg/dL (ref 212.0–360.0)

## 2024-01-09 LAB — CBC WITH DIFFERENTIAL/PLATELET
Basophils Absolute: 0.1 10*3/uL (ref 0.0–0.1)
Basophils Relative: 1.2 % (ref 0.0–3.0)
Eosinophils Absolute: 0.1 10*3/uL (ref 0.0–0.7)
Eosinophils Relative: 2.6 % (ref 0.0–5.0)
HCT: 31.4 % — ABNORMAL LOW (ref 36.0–46.0)
Hemoglobin: 9.8 g/dL — ABNORMAL LOW (ref 12.0–15.0)
Lymphocytes Relative: 19.2 % (ref 12.0–46.0)
Lymphs Abs: 0.9 10*3/uL (ref 0.7–4.0)
MCHC: 31.3 g/dL (ref 30.0–36.0)
MCV: 76.6 fl — ABNORMAL LOW (ref 78.0–100.0)
Monocytes Absolute: 0.6 10*3/uL (ref 0.1–1.0)
Monocytes Relative: 14.2 % — ABNORMAL HIGH (ref 3.0–12.0)
Neutro Abs: 2.8 10*3/uL (ref 1.4–7.7)
Neutrophils Relative %: 62.8 % (ref 43.0–77.0)
Platelets: 279 10*3/uL (ref 150.0–400.0)
RBC: 4.1 Mil/uL (ref 3.87–5.11)
RDW: 16.7 % — ABNORMAL HIGH (ref 11.5–15.5)
WBC: 4.5 10*3/uL (ref 4.0–10.5)

## 2024-01-09 LAB — POC URINALSYSI DIPSTICK (AUTOMATED)
Bilirubin, UA: NEGATIVE
Blood, UA: NEGATIVE
Glucose, UA: NEGATIVE
Ketones, UA: NEGATIVE
Nitrite, UA: NEGATIVE
Protein, UA: NEGATIVE
Spec Grav, UA: 1.015 (ref 1.010–1.025)
Urobilinogen, UA: 0.2 U/dL
pH, UA: 6 (ref 5.0–8.0)

## 2024-01-09 NOTE — Patient Instructions (Addendum)
 Labs today Start tapering down on ambien  use to 1/2 tablet every other night for 2 weeks then stop. May try melatonin 5mg  at night time for sleep.  I will set you up for repeat iron  infusions.  I'd like to refer you to [facility] neurology and back to [facility] GI Dr Mamie Searles as you're due for repeat colonoscopy. Return in 2-3 weeks for memory evaluation.
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
## 2024-01-11 ENCOUNTER — Other Ambulatory Visit

## 2024-01-11 ENCOUNTER — Encounter: Payer: Self-pay | Admitting: Family Medicine

## 2024-01-11 DIAGNOSIS — R413 Other amnesia: Secondary | ICD-10-CM | POA: Insufficient documentation

## 2024-01-11 DIAGNOSIS — D509 Iron deficiency anemia, unspecified: Secondary | ICD-10-CM

## 2024-01-11 DIAGNOSIS — R55 Syncope and collapse: Secondary | ICD-10-CM | POA: Insufficient documentation

## 2024-01-11 NOTE — Assessment & Plan Note (Signed)
 Chronic period on cymbalta  60mg  daily with wellbutrin  SR 100mg  daily.  Continue current regimen, consider tapering cymbalta  dose.

## 2024-01-11 NOTE — Assessment & Plan Note (Addendum)
 H/o this s/p Venofer  iron  infusion x5 (08/2022). Anemia had resolved on follow up testing.  Initial colonoscopy incomplete due to poor prep - overdue for repeat colonoscopy (09/2022).  EGD showed gastric ulcers with clean base, mild chronic gastritis, duodenal diverticulum - she continue daily pantoprazole 40mg .  Update labs today - if recurrent anemia, low threshold to order rpt iron  infusion in [place].  Will refer back to [facility] GI (previously saw Dr Mamie Searles).  Check UA today - no significant hematuria.

## 2024-01-11 NOTE — Assessment & Plan Note (Signed)
 Noted trouble with recall and calculation on brief memory testing today. I have asked her to return in 2-3 weeks for formal geriatric assessment. Will also refer to neurology with recent syncopal spell
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
## 2024-01-11 NOTE — Assessment & Plan Note (Addendum)
 Chronic, longstanding on ambien  5mg  nightly.  With recent syncopal episode and noted  memory difficulty, recommend fully tapering off non-benzo hypnotic as per instructions.  May retry melatonin.  Consider trazodone .

## 2024-01-12 ENCOUNTER — Encounter: Payer: Self-pay | Admitting: Family Medicine

## 2024-01-12 ENCOUNTER — Telehealth: Payer: Self-pay

## 2024-01-12 LAB — FERRITIN: Ferritin: 6 ng/mL — ABNORMAL LOW (ref 16–288)

## 2024-01-12 NOTE — Telephone Encounter (Signed)
 Hello,  Pt will be scheduled as soon as possible.   Auth Submission: NO AUTH NEEDED Site of care: Site of care: [facility] Payer: [employer] medicare Medication & CPT/J Code(s) submitted: Venofer  (Iron  Sucrose) J1756 Route of submission (phone, fax, portal): portal Phone # Fax # Auth type: Buy/Bill PB Units/visits requested: 200mg  x 5dosesd Reference number:  Approval from: 01/12/24 to 07/31/24

## 2024-01-12 NOTE — Addendum Note (Signed)
 Addended by: Claire Crick on: 01/12/2024 07:28 AM   Modules accepted: Orders

## 2024-01-13 ENCOUNTER — Ambulatory Visit: Payer: Self-pay | Admitting: Family Medicine

## 2024-01-15 ENCOUNTER — Ambulatory Visit: Payer: Self-pay | Admitting: Family Medicine

## 2024-01-17 DIAGNOSIS — D0462 Carcinoma in situ of skin of left upper limb, including shoulder: Secondary | ICD-10-CM | POA: Diagnosis not present

## 2024-01-17 DIAGNOSIS — L570 Actinic keratosis: Secondary | ICD-10-CM | POA: Diagnosis not present

## 2024-01-17 DIAGNOSIS — D2272 Melanocytic nevi of left lower limb, including hip: Secondary | ICD-10-CM | POA: Diagnosis not present

## 2024-01-17 DIAGNOSIS — D2261 Melanocytic nevi of right upper limb, including shoulder: Secondary | ICD-10-CM | POA: Diagnosis not present

## 2024-01-17 DIAGNOSIS — D2262 Melanocytic nevi of left upper limb, including shoulder: Secondary | ICD-10-CM | POA: Diagnosis not present

## 2024-01-17 DIAGNOSIS — Z85828 Personal history of other malignant neoplasm of skin: Secondary | ICD-10-CM | POA: Diagnosis not present

## 2024-01-17 DIAGNOSIS — D225 Melanocytic nevi of trunk: Secondary | ICD-10-CM | POA: Diagnosis not present

## 2024-01-17 DIAGNOSIS — D485 Neoplasm of uncertain behavior of skin: Secondary | ICD-10-CM | POA: Diagnosis not present

## 2024-01-23 ENCOUNTER — Ambulatory Visit

## 2024-01-23 MED ORDER — IRON SUCROSE 20 MG/ML IV SOLN: 200.0000 mg | Freq: Once | INTRAVENOUS | Status: AC

## 2024-01-25 ENCOUNTER — Ambulatory Visit

## 2024-01-25 VITALS — BP 108/64 | HR 68 | Temp 97.5°F | Resp 16 | Ht <= 58 in | Wt 106.2 lb

## 2024-01-25 DIAGNOSIS — D509 Iron deficiency anemia, unspecified: Secondary | ICD-10-CM

## 2024-01-25 MED ORDER — IRON SUCROSE 20 MG/ML IV SOLN
200.0000 mg | Freq: Once | INTRAVENOUS | Status: AC
  Administered 2024-01-25: 200 mg via INTRAVENOUS
  Filled 2024-01-25: qty 10

## 2024-01-25 NOTE — Progress Notes (Signed)
 Diagnosis: Acute Anemia  Provider:  Chilton Greathouse MD  Procedure: IV Push  IV Type: Peripheral, IV Location: L Antecubital  Venofer (Iron Sucrose), Dose: 200 mg  Post Infusion IV Care: Observation period completed and Peripheral IV Discontinued  Discharge: Condition: Good, Destination: Home . AVS Declined  Performed by:  Nat Math, RN

## 2024-01-30 ENCOUNTER — Ambulatory Visit

## 2024-01-30 VITALS — BP 109/69 | HR 68 | Temp 98.0°F | Resp 16 | Ht <= 58 in | Wt 108.4 lb

## 2024-01-30 DIAGNOSIS — D509 Iron deficiency anemia, unspecified: Secondary | ICD-10-CM

## 2024-01-30 MED ORDER — IRON SUCROSE 20 MG/ML IV SOLN
200.0000 mg | Freq: Once | INTRAVENOUS | Status: AC
  Administered 2024-01-30: 200 mg via INTRAVENOUS
  Filled 2024-01-30: qty 10

## 2024-01-30 NOTE — Progress Notes (Signed)
 Diagnosis: Acute Anemia  Provider:  Praveen Mannam MD  Procedure: IV Push  IV Type: Peripheral, IV Location: L Antecubital  Venofer  (Iron  Sucrose), Dose: 200 mg  Post Infusion IV Care: Observation period completed  Discharge: Condition: Good, Destination: Home . AVS Provided  Performed by:  Eleanor DELENA Bloch, RN

## 2024-01-31 ENCOUNTER — Ambulatory Visit (INDEPENDENT_AMBULATORY_CARE_PROVIDER_SITE_OTHER): Admitting: Family Medicine

## 2024-01-31 ENCOUNTER — Encounter: Payer: Self-pay | Admitting: Family Medicine

## 2024-01-31 VITALS — BP 132/72 | HR 79 | Temp 98.1°F | Ht <= 58 in | Wt 107.4 lb

## 2024-01-31 DIAGNOSIS — R413 Other amnesia: Secondary | ICD-10-CM | POA: Diagnosis not present

## 2024-01-31 DIAGNOSIS — D509 Iron deficiency anemia, unspecified: Secondary | ICD-10-CM | POA: Diagnosis not present

## 2024-01-31 DIAGNOSIS — F5101 Primary insomnia: Secondary | ICD-10-CM | POA: Diagnosis not present

## 2024-01-31 DIAGNOSIS — R55 Syncope and collapse: Secondary | ICD-10-CM | POA: Diagnosis not present

## 2024-01-31 NOTE — Patient Instructions (Addendum)
 Try melatonin 5mg  at night time for sleep.  I recommend staying off ambien .   Bedtime routine checklist: 1. Avoid naps during the day 2. Avoid stimulants such as caffeine and nicotine. Avoid bedtime alcohol (it can speed onset of sleep but the body's metabolism can cause awakenings). 3. All forms of exercise help ensure sound sleep - limit vigorous exercise to morning or late afternoon 4. Avoid food too close to bedtime including chocolate (which contains caffeine) 5. Soak up natural light 6. Establish regular bedtime routine. 7. Associate bed with sleep - avoid TV, computer or phone, reading while in bed. 8. Ensure pleasant, relaxing sleep environment - quiet, dark, cool room.   Complete iron  infusions.  Call to schedule GI doctor appointment for further evaluation of anemia:  [facility] [address] [place] [address] [phone]
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
## 2024-01-31 NOTE — Assessment & Plan Note (Signed)
 Reassuring MMSE and evaluation today.  Anticipate prior cognitive concerns related to ambien  use and anemia.  See below.

## 2024-01-31 NOTE — Assessment & Plan Note (Signed)
 No recurrence. Now off ambien .  Pending [facility] appt 02/20/2024 - will appreciate their eval.  Reassuring memory eval - see below.

## 2024-01-31 NOTE — Assessment & Plan Note (Addendum)
 Long term ambien  use, stopped last month. Will recommend stay off ambien  due ot above concerns. Trial melatonin 5mg  nightly.  Sleep hygiene measures reviewed, bedtime routine checklist provided.

## 2024-01-31 NOTE — Assessment & Plan Note (Addendum)
 In midst of Venofer  infusions x5.  Recommend call [facility] to complete IDA evaluation.

## 2024-02-01 ENCOUNTER — Ambulatory Visit

## 2024-02-01 VITALS — BP 119/57 | HR 85 | Temp 97.8°F | Resp 16 | Ht <= 58 in | Wt 109.2 lb

## 2024-02-01 DIAGNOSIS — D509 Iron deficiency anemia, unspecified: Secondary | ICD-10-CM | POA: Diagnosis not present

## 2024-02-01 MED ORDER — IRON SUCROSE 20 MG/ML IV SOLN
200.0000 mg | Freq: Once | INTRAVENOUS | Status: AC
  Administered 2024-02-01: 200 mg via INTRAVENOUS
  Filled 2024-02-01: qty 10

## 2024-02-01 NOTE — Progress Notes (Signed)
 Diagnosis: Iron Deficiency Anemia  Provider:  Chilton Greathouse MD  Procedure: IV Push  IV Type: Peripheral, IV Location: R Antecubital  Venofer (Iron Sucrose), Dose: 200 mg  Post Infusion IV Care: Observation period completed and Peripheral IV Discontinued  Discharge: Condition: Good, Destination: Home . AVS Declined  Performed by:  Rico Ala, LPN

## 2024-02-05 ENCOUNTER — Other Ambulatory Visit: Payer: Self-pay | Admitting: Family Medicine

## 2024-02-06 ENCOUNTER — Ambulatory Visit

## 2024-02-06 VITALS — BP 116/70 | HR 60 | Temp 98.0°F | Resp 18 | Ht <= 58 in | Wt 107.4 lb

## 2024-02-06 DIAGNOSIS — D509 Iron deficiency anemia, unspecified: Secondary | ICD-10-CM

## 2024-02-06 MED ORDER — IRON SUCROSE 20 MG/ML IV SOLN
200.0000 mg | Freq: Once | INTRAVENOUS | Status: AC
  Administered 2024-02-06: 200 mg via INTRAVENOUS
  Filled 2024-02-06: qty 10

## 2024-02-06 NOTE — Progress Notes (Signed)
 Diagnosis: Iron Deficiency Anemia  Provider:  Chilton Greathouse MD  Procedure: IV Push  IV Type: Peripheral, IV Location: L Antecubital  Venofer (Iron Sucrose), Dose: 200 mg  Post Infusion IV Care: Observation period completed and Peripheral IV Discontinued  Discharge: Condition: Good, Destination: Home . AVS Provided  Performed by:  Loney Hering, LPN

## 2024-02-08 ENCOUNTER — Ambulatory Visit

## 2024-02-08 VITALS — BP 126/58 | HR 67 | Temp 97.8°F | Resp 12 | Ht 58.5 in | Wt 107.4 lb

## 2024-02-08 DIAGNOSIS — D509 Iron deficiency anemia, unspecified: Secondary | ICD-10-CM

## 2024-02-08 MED ORDER — IRON SUCROSE 20 MG/ML IV SOLN
200.0000 mg | Freq: Once | INTRAVENOUS | Status: AC
  Administered 2024-02-08: 200 mg via INTRAVENOUS
  Filled 2024-02-08: qty 10

## 2024-02-08 NOTE — Progress Notes (Signed)
 Diagnosis: Iron Deficiency Anemia  Provider:  Chilton Greathouse MD  Procedure: IV Push  IV Type: Peripheral, IV Location: L Antecubital  Venofer (Iron Sucrose), Dose: 200 mg  Post Infusion IV Care: Observation period completed and Peripheral IV Discontinued  Discharge: Condition: Good, Destination: Home . AVS Provided  Performed by:  Rico Ala, LPN

## 2024-02-20 DIAGNOSIS — G4700 Insomnia, unspecified: Secondary | ICD-10-CM | POA: Diagnosis not present

## 2024-02-20 DIAGNOSIS — G250 Essential tremor: Secondary | ICD-10-CM | POA: Diagnosis not present

## 2024-02-20 DIAGNOSIS — E538 Deficiency of other specified B group vitamins: Secondary | ICD-10-CM | POA: Diagnosis not present

## 2024-02-20 DIAGNOSIS — G479 Sleep disorder, unspecified: Secondary | ICD-10-CM | POA: Diagnosis not present

## 2024-02-20 DIAGNOSIS — Z1331 Encounter for screening for depression: Secondary | ICD-10-CM | POA: Diagnosis not present

## 2024-02-20 DIAGNOSIS — E038 Other specified hypothyroidism: Secondary | ICD-10-CM | POA: Diagnosis not present

## 2024-02-20 DIAGNOSIS — R55 Syncope and collapse: Secondary | ICD-10-CM | POA: Diagnosis not present

## 2024-02-20 DIAGNOSIS — R413 Other amnesia: Secondary | ICD-10-CM | POA: Diagnosis not present

## 2024-02-20 DIAGNOSIS — Z8673 Personal history of transient ischemic attack (TIA), and cerebral infarction without residual deficits: Secondary | ICD-10-CM | POA: Diagnosis not present

## 2024-02-27 DIAGNOSIS — K259 Gastric ulcer, unspecified as acute or chronic, without hemorrhage or perforation: Secondary | ICD-10-CM | POA: Diagnosis not present

## 2024-02-27 DIAGNOSIS — R1319 Other dysphagia: Secondary | ICD-10-CM | POA: Diagnosis not present

## 2024-02-27 DIAGNOSIS — K222 Esophageal obstruction: Secondary | ICD-10-CM | POA: Diagnosis not present

## 2024-02-27 DIAGNOSIS — D500 Iron deficiency anemia secondary to blood loss (chronic): Secondary | ICD-10-CM | POA: Diagnosis not present

## 2024-03-16 ENCOUNTER — Other Ambulatory Visit: Payer: Self-pay | Admitting: Family Medicine

## 2024-03-16 DIAGNOSIS — J439 Emphysema, unspecified: Secondary | ICD-10-CM

## 2024-04-04 ENCOUNTER — Ambulatory Visit: Admission: RE | Admit: 2024-04-04 | Source: Home / Self Care | Admitting: Gastroenterology

## 2024-04-04 ENCOUNTER — Encounter: Admission: RE | Payer: Self-pay | Source: Home / Self Care

## 2024-04-04 SURGERY — COLONOSCOPY
Anesthesia: General

## 2024-04-21 ENCOUNTER — Other Ambulatory Visit: Payer: Self-pay | Admitting: Family Medicine

## 2024-04-21 DIAGNOSIS — D509 Iron deficiency anemia, unspecified: Secondary | ICD-10-CM

## 2024-04-21 DIAGNOSIS — E538 Deficiency of other specified B group vitamins: Secondary | ICD-10-CM

## 2024-04-21 DIAGNOSIS — E559 Vitamin D deficiency, unspecified: Secondary | ICD-10-CM

## 2024-04-21 DIAGNOSIS — R413 Other amnesia: Secondary | ICD-10-CM

## 2024-04-21 DIAGNOSIS — E785 Hyperlipidemia, unspecified: Secondary | ICD-10-CM

## 2024-04-24 ENCOUNTER — Other Ambulatory Visit (INDEPENDENT_AMBULATORY_CARE_PROVIDER_SITE_OTHER)

## 2024-04-24 DIAGNOSIS — R413 Other amnesia: Secondary | ICD-10-CM

## 2024-04-24 DIAGNOSIS — E559 Vitamin D deficiency, unspecified: Secondary | ICD-10-CM | POA: Diagnosis not present

## 2024-04-24 DIAGNOSIS — E785 Hyperlipidemia, unspecified: Secondary | ICD-10-CM

## 2024-04-24 DIAGNOSIS — E538 Deficiency of other specified B group vitamins: Secondary | ICD-10-CM

## 2024-04-24 DIAGNOSIS — D509 Iron deficiency anemia, unspecified: Secondary | ICD-10-CM | POA: Diagnosis not present

## 2024-04-24 LAB — LIPID PANEL
Cholesterol: 171 mg/dL (ref 0–200)
HDL: 56.6 mg/dL (ref >39.00)
LDL Cholesterol: 100 mg/dL — ABNORMAL HIGH (ref 0–99)
NonHDL: 114.77
Total CHOL/HDL Ratio: 3
Triglycerides: 74 mg/dL (ref 0.0–149.0)
VLDL: 14.8 mg/dL (ref 0.0–40.0)

## 2024-04-24 LAB — COMPREHENSIVE METABOLIC PANEL WITH GFR
ALT: 10 U/L (ref 0–35)
AST: 8 U/L (ref 0–37)
Albumin: 4 g/dL (ref 3.5–5.2)
Alkaline Phosphatase: 79 U/L (ref 39–117)
BUN: 22 mg/dL (ref 6–23)
CO2: 30 meq/L (ref 19–32)
Calcium: 9.3 mg/dL (ref 8.4–10.5)
Chloride: 105 meq/L (ref 96–112)
Creatinine, Ser: 0.73 mg/dL (ref 0.40–1.20)
GFR: 79.74 mL/min (ref >60.00)
Glucose, Bld: 86 mg/dL (ref 70–99)
Potassium: 3.8 meq/L (ref 3.5–5.1)
Sodium: 143 meq/L (ref 135–145)
Total Bilirubin: 0.3 mg/dL (ref 0.2–1.2)
Total Protein: 6 g/dL (ref 6.0–8.3)

## 2024-04-24 LAB — CBC WITH DIFFERENTIAL/PLATELET
Basophils Absolute: 0 K/uL (ref 0.0–0.1)
Basophils Relative: 0.6 % (ref 0.0–3.0)
Eosinophils Absolute: 0.3 K/uL (ref 0.0–0.7)
Eosinophils Relative: 5.3 % — ABNORMAL HIGH (ref 0.0–5.0)
HCT: 37.1 % (ref 36.0–46.0)
Hemoglobin: 12.4 g/dL (ref 12.0–15.0)
Lymphocytes Relative: 27.6 % (ref 12.0–46.0)
Lymphs Abs: 1.5 K/uL (ref 0.7–4.0)
MCHC: 33.3 g/dL (ref 30.0–36.0)
MCV: 89.2 fl (ref 78.0–100.0)
Monocytes Absolute: 0.5 K/uL (ref 0.1–1.0)
Monocytes Relative: 9.5 % (ref 3.0–12.0)
Neutro Abs: 3 K/uL (ref 1.4–7.7)
Neutrophils Relative %: 57 % (ref 43.0–77.0)
Platelets: 169 K/uL (ref 150.0–400.0)
RBC: 4.16 Mil/uL (ref 3.87–5.11)
RDW: 15 % (ref 11.5–15.5)
WBC: 5.3 K/uL (ref 4.0–10.5)

## 2024-04-24 LAB — VITAMIN B12: Vitamin B-12: 254 pg/mL (ref 211–911)

## 2024-04-24 LAB — IBC PANEL
Iron: 50 ug/dL (ref 42–145)
Saturation Ratios: 16.7 % — ABNORMAL LOW (ref 20.0–50.0)
TIBC: 299.6 ug/dL (ref 250.0–450.0)
Transferrin: 214 mg/dL (ref 212.0–360.0)

## 2024-04-24 LAB — FERRITIN: Ferritin: 121.2 ng/mL (ref 10.0–291.0)

## 2024-04-24 LAB — VITAMIN D 25 HYDROXY (VIT D DEFICIENCY, FRACTURES): VITD: 35.1 ng/mL (ref 30.00–100.00)

## 2024-04-24 LAB — TSH: TSH: 1.94 u[IU]/mL (ref 0.35–5.50)

## 2024-05-01 ENCOUNTER — Encounter: Payer: Self-pay | Admitting: Family Medicine

## 2024-05-01 ENCOUNTER — Telehealth: Payer: Self-pay | Admitting: Family Medicine

## 2024-05-01 ENCOUNTER — Ambulatory Visit: Admitting: Family Medicine

## 2024-05-01 VITALS — BP 152/80 | HR 62 | Temp 97.8°F | Ht 58.75 in | Wt 108.5 lb

## 2024-05-01 DIAGNOSIS — E785 Hyperlipidemia, unspecified: Secondary | ICD-10-CM

## 2024-05-01 DIAGNOSIS — D509 Iron deficiency anemia, unspecified: Secondary | ICD-10-CM | POA: Diagnosis not present

## 2024-05-01 DIAGNOSIS — Z0000 Encounter for general adult medical examination without abnormal findings: Secondary | ICD-10-CM

## 2024-05-01 DIAGNOSIS — E559 Vitamin D deficiency, unspecified: Secondary | ICD-10-CM

## 2024-05-01 DIAGNOSIS — R251 Tremor, unspecified: Secondary | ICD-10-CM

## 2024-05-01 DIAGNOSIS — I10 Essential (primary) hypertension: Secondary | ICD-10-CM

## 2024-05-01 DIAGNOSIS — Z23 Encounter for immunization: Secondary | ICD-10-CM | POA: Diagnosis not present

## 2024-05-01 DIAGNOSIS — K222 Esophageal obstruction: Secondary | ICD-10-CM | POA: Diagnosis not present

## 2024-05-01 DIAGNOSIS — E538 Deficiency of other specified B group vitamins: Secondary | ICD-10-CM

## 2024-05-01 DIAGNOSIS — R413 Other amnesia: Secondary | ICD-10-CM

## 2024-05-01 DIAGNOSIS — F4321 Adjustment disorder with depressed mood: Secondary | ICD-10-CM

## 2024-05-01 DIAGNOSIS — K259 Gastric ulcer, unspecified as acute or chronic, without hemorrhage or perforation: Secondary | ICD-10-CM

## 2024-05-01 DIAGNOSIS — F5101 Primary insomnia: Secondary | ICD-10-CM

## 2024-05-01 DIAGNOSIS — H547 Unspecified visual loss: Secondary | ICD-10-CM

## 2024-05-01 DIAGNOSIS — J439 Emphysema, unspecified: Secondary | ICD-10-CM

## 2024-05-01 DIAGNOSIS — M810 Age-related osteoporosis without current pathological fracture: Secondary | ICD-10-CM

## 2024-05-01 DIAGNOSIS — Z7189 Other specified counseling: Secondary | ICD-10-CM

## 2024-05-01 MED ORDER — PROPRANOLOL HCL 10 MG PO TABS: 10.0000 mg | ORAL_TABLET | Freq: Two times a day (BID) | ORAL | 3 refills | Status: AC

## 2024-05-01 MED ORDER — VITAMIN D3 25 MCG (1000 UT) PO CAPS: 1.0000 | ORAL_CAPSULE | Freq: Every day | ORAL | Status: AC

## 2024-05-01 MED ORDER — DULOXETINE HCL 30 MG PO CPEP: 30.0000 mg | ORAL_CAPSULE | Freq: Every day | ORAL | 3 refills | Status: AC

## 2024-05-01 MED ORDER — BUPROPION HCL ER (SR) 100 MG PO TB12: 100.0000 mg | ORAL_TABLET | Freq: Every day | ORAL | 3 refills | Status: AC

## 2024-05-01 MED ORDER — BUDESONIDE-FORMOTEROL FUMARATE 80-4.5 MCG/ACT IN AERO
2.0000 | INHALATION_SPRAY | Freq: Two times a day (BID) | RESPIRATORY_TRACT | 11 refills | Status: AC
Start: 2024-05-01 — End: ?

## 2024-05-01 MED ORDER — LISINOPRIL 10 MG PO TABS: 10.0000 mg | ORAL_TABLET | Freq: Every day | ORAL | 3 refills | Status: DC

## 2024-05-01 MED ORDER — ATORVASTATIN CALCIUM 10 MG PO TABS: 10.0000 mg | ORAL_TABLET | Freq: Every day | ORAL | 3 refills | Status: AC

## 2024-05-01 MED ORDER — ALBUTEROL SULFATE HFA 108 (90 BASE) MCG/ACT IN AERS: 2.0000 | INHALATION_SPRAY | Freq: Four times a day (QID) | RESPIRATORY_TRACT | 3 refills | Status: AC | PRN

## 2024-05-01 MED ORDER — CYANOCOBALAMIN 500 MCG PO TABS: 500.0000 ug | ORAL_TABLET | ORAL | Status: AC

## 2024-05-01 NOTE — Patient Instructions (Addendum)
 Prevnar-20 today  Egg free flu shot today.  If interested, check with pharmacy about new 2 shot shingles series (shingrix).  Take a whole tablet of trazodone .  Start lisinopril  10mg  daily for blood pressure.  Restart mood medicines sent to pharmacy Restart vitamin D  1000 units daily, vitamin b12 500mcg MWF.  Keep neurology appointment next week.  Call Dr Monia office about rescheduling colonoscopy/endoscopy.  Call to to schedule bone density scan: [facility] [phone] We are overdue for prolia  shot - we will contact you to get this scheduled. If unaffordable, we may proceed with yearly IV infusion called Reclast.  Return in 3 months for follow up visit Good to see you today
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
## 2024-05-01 NOTE — Assessment & Plan Note (Signed)

## 2024-05-01 NOTE — Assessment & Plan Note (Signed)
 Preventative protocols reviewed and updated unless pt declined. Discussed healthy diet and lifestyle.

## 2024-05-01 NOTE — Assessment & Plan Note (Addendum)
 Advanced directives: scanned 12/2022. No prolonged life support if terminal condition. HCPOA is daughter Lucie Kirsch. Wants advanced directive followed.

## 2024-05-01 NOTE — Telephone Encounter (Signed)
 Can we price out prolia  for patient? Last prolia  shot was 06/2022.

## 2024-05-02 ENCOUNTER — Ambulatory Visit: Payer: Self-pay | Admitting: Family Medicine

## 2024-05-03 ENCOUNTER — Telehealth: Payer: Self-pay

## 2024-05-03 MED ORDER — DENOSUMAB 60 MG/ML ~~LOC~~ SOSY: 60.0000 mg | PREFILLED_SYRINGE | Freq: Once | SUBCUTANEOUS | Status: AC

## 2024-05-03 NOTE — Addendum Note (Signed)
 Addended by: ALBINO SHAVER C on: 05/03/2024 03:19 PM   Modules accepted: Orders

## 2024-05-03 NOTE — Telephone Encounter (Signed)
 Referral has been placed to start benefit verification process.

## 2024-05-03 NOTE — Telephone Encounter (Signed)
 Prolia  VOB initiated via [email or web address]  Next Prolia  inj DUE: RESTART

## 2024-05-06 ENCOUNTER — Ambulatory Visit: Payer: Self-pay

## 2024-05-06 ENCOUNTER — Other Ambulatory Visit (HOSPITAL_COMMUNITY): Payer: Self-pay

## 2024-05-06 ENCOUNTER — Other Ambulatory Visit: Payer: Self-pay | Admitting: Family Medicine

## 2024-05-06 MED ORDER — BENZONATATE 100 MG PO CAPS: 100.0000 mg | ORAL_CAPSULE | Freq: Three times a day (TID) | ORAL | 0 refills | Status: DC | PRN

## 2024-05-06 NOTE — Assessment & Plan Note (Addendum)
 H/o this.  She is no longer on PPI.  Overdue for GI f/u - planned EGD/colonoscopy.  Rec call [facility] to reschedule this

## 2024-05-06 NOTE — Assessment & Plan Note (Addendum)
 Chronic on statin but out for several months. Will refill atorva 10mg  - continue The 10-year ASCVD risk score (Arnett DK, et al., 2019) is: 31.1%   Values used to calculate the score:     Age: 78 years     Clincally relevant sex: Female     Is Non-Hispanic African American: No     Diabetic: No     Tobacco smoker: No     Systolic Blood Pressure: 152 mmHg     Is BP treated: Yes     HDL Cholesterol: 56.6 mg/dL     Total Cholesterol: 171 mg/dL

## 2024-05-06 NOTE — Telephone Encounter (Signed)
 Patient called with concerns for a persistent cough. Patient states cough started two days ago-nonproductive. Patient denies SOB or CP. Asking for medication for the cough. Requesting a call back   FYI Only or Action Required?: Action required by provider: patient is requesting medication for a cough.  Patient was last seen in primary care on 05/01/2024 by Rilla Baller, MD.  Called Nurse Triage reporting Cough.  Symptoms began 2 days ago.  Interventions attempted: Rest, hydration, or home remedies.  Symptoms are: unchanged.  Triage Disposition: See Physician Within 24 Hours  Patient/caregiver understands and will follow disposition?: No, wishes to speak with PCP  Copied from CRM [id number]. Topic: Clinical - Medical Advice >> May 06, 2024  3:35 PM Robinson H wrote: Reason for CRM: Patient states she has a persistent cough, and wants to know if Dr. Rilla will call her in some prednisone  or something to deal with the cough. Patient using pharmacy on file  [place] [phone] Reason for Disposition  SEVERE coughing spells (e.g., whooping sound after coughing, vomiting after coughing)  Answer Assessment - Initial Assessment Questions 1. ONSET: When did the cough begin?      2 days ago 2. SEVERITY: How bad is the cough today?      severe 3. SPUTUM: Describe the color of your sputum (e.g., none, dry cough; clear, white, yellow, green)     dry 4. HEMOPTYSIS: Are you coughing up any blood? If Yes, ask: How much? (e.g., flecks, streaks, tablespoons, etc.)     no 5. DIFFICULTY BREATHING: Are you having difficulty breathing? If Yes, ask: How bad is it? (e.g., mild, moderate, severe)      no 6. FEVER: Do you have a fever? If Yes, ask: What is your temperature, how was it measured, and when did it start?     no 7. CARDIAC HISTORY: Do you have any history of heart disease? (e.g., heart attack, congestive heart failure)      no 8. LUNG HISTORY: Do you have any  history of lung disease?  (e.g., pulmonary embolus, asthma, emphysema)     yes 9. PE RISK FACTORS: Do you have a history of blood clots? (or: recent major surgery, recent prolonged travel, bedridden)     no 10. OTHER SYMPTOMS: Do you have any other symptoms? (e.g., runny nose, wheezing, chest pain)       Runny nose 12. TRAVEL: Have you traveled out of the country in the last month? (e.g., travel history, exposures)       no  Protocols used: Cough - Acute Non-Productive-A-AH

## 2024-05-06 NOTE — Telephone Encounter (Signed)
 Courtney Thomas

## 2024-05-06 NOTE — Assessment & Plan Note (Signed)
 Low leverls-  rec start vit B12 500mcg MWF.

## 2024-05-06 NOTE — Telephone Encounter (Signed)
 Pt ready for scheduling for PROLIA  on or after : 05/06/24  Option# 1: Buy/Bill (Office supplied medication)  Out-of-pocket cost due at time of clinic visit: $347  Number of injection/visits approved: 2  Primary: [employer] Prolia  co-insurance: 20% Admin fee co-insurance: $15  Secondary: --- Prolia  co-insurance:  Admin fee co-insurance:   Medical Benefit Details: Date Benefits were checked: 05/06/24 Deductible: NO/ Coinsurance: 20%/ Admin Fee: $15  Prior Auth: APPROVED PA# [id number] Expiration Date: 09/23/20-07/31/24  # of doses approved: 2 ----------------------------------------------------------------------- Option# 2- Med Obtained from pharmacy:  Pharmacy benefit: Copay $793.31 (Paid to pharmacy) Admin Fee: $15 (Pay at clinic)  Prior Auth: N/A PA# Expiration Date:   # of doses approved:   If patient wants fill through the pharmacy benefit please send prescription to: [facility], and include estimated need by date in rx notes. Pharmacy will ship medication directly to the office.  Patient NOT eligible for Prolia  Copay Card. Copay Card can make patient's cost as little as $25. Link to apply: [email or web address]://[email or web address].[email or web address]  ** This summary of benefits is an estimation of the patient's out-of-pocket cost. Exact cost may very based on individual plan coverage.

## 2024-05-06 NOTE — Assessment & Plan Note (Addendum)
 Latest DEXA reviewed 2023. Due for rpt.  # provided to [facility] imaging to call and schedule this.  Overdue for prolia  (last 06/2022) - will request our office price this out. If unaffordable, consider yearly Reclast IV infusion.

## 2024-05-06 NOTE — Assessment & Plan Note (Signed)
 Overdue for eye exam - encouraged she schedule this.

## 2024-05-06 NOTE — Assessment & Plan Note (Signed)
 Chronic, BP elevated since sne ran out of lisinopril  - refilled today.

## 2024-05-06 NOTE — Assessment & Plan Note (Signed)
 Noted, progressive, however overall reassuring MMSE 01/2024.  She has neuro f/u planned later this month.

## 2024-05-06 NOTE — Assessment & Plan Note (Signed)
 Chronic on wellbutrin  and duloxetine  but states she ran out of meds a few months ago. Will restart. Discussed should not stop these medicines cold [place].

## 2024-05-06 NOTE — Telephone Encounter (Signed)
 Please notify I've sent in tessalon  perls for cough. I also want her to restart her Symbicort  controller inhaler which I sent to her pharmacy last week. She may also use PRN albuterol  rescue inhaler which I also refilled last week to [facility] care pharmacy.   To let us  know if worsening cough or sob, becoming productive, any associated fever or other worsening symptoms
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
## 2024-05-06 NOTE — Assessment & Plan Note (Signed)
 On low wellbutrin  dose given concern this could be contributing to symptoms.  Continue propranolol  10mg  bid - refilled today.

## 2024-05-06 NOTE — Progress Notes (Signed)
 ERx tessalon  perls  - see CRM

## 2024-05-06 NOTE — Assessment & Plan Note (Addendum)
 H/o gastric ulcer and incomplete colonoscopy 2024.  Overdue for GI f/u - planned EGD/colonoscopy.  S/p venofer  IV iron  infusion x5 08/2022, again venofer  IV iron  infusion 01/2024 x5.  Recommend reschedule this.

## 2024-05-06 NOTE — Assessment & Plan Note (Signed)
 Stable period - continue vit D 1000 units daily. H/o vit D excess.

## 2024-05-06 NOTE — Assessment & Plan Note (Signed)
 H/o this 09/2022.

## 2024-05-06 NOTE — Assessment & Plan Note (Signed)
 Notes worsened respiratory status since running out of symbicort  - refilled today.

## 2024-05-07 NOTE — Telephone Encounter (Signed)
 Lvm asking pt to call back. Pls relay Dr Talmadge message.

## 2024-05-09 NOTE — Telephone Encounter (Addendum)
 Please provide below message from provider/office when call is returned from patient.   I have reached out to pharmacy and all meds have been picked up.

## 2024-05-14 DIAGNOSIS — Z8673 Personal history of transient ischemic attack (TIA), and cerebral infarction without residual deficits: Secondary | ICD-10-CM | POA: Diagnosis not present

## 2024-05-14 DIAGNOSIS — R413 Other amnesia: Secondary | ICD-10-CM | POA: Diagnosis not present

## 2024-05-14 DIAGNOSIS — E038 Other specified hypothyroidism: Secondary | ICD-10-CM | POA: Diagnosis not present

## 2024-05-14 DIAGNOSIS — G479 Sleep disorder, unspecified: Secondary | ICD-10-CM | POA: Diagnosis not present

## 2024-05-14 DIAGNOSIS — G4700 Insomnia, unspecified: Secondary | ICD-10-CM | POA: Diagnosis not present

## 2024-05-14 DIAGNOSIS — E538 Deficiency of other specified B group vitamins: Secondary | ICD-10-CM | POA: Diagnosis not present

## 2024-05-15 ENCOUNTER — Telehealth: Payer: Self-pay

## 2024-05-15 NOTE — Telephone Encounter (Signed)
 Copied from CRM #[id number]. Topic: General - Other >> May 15, 2024  8:46 AM Berneda FALCON wrote: Reason for CRM: Pt is returning phone call from today. I read Dr. Talmadge message verbatim and patient had no additional questions at this time.

## 2024-05-15 NOTE — Telephone Encounter (Signed)
 Left message on voicemail, per dpr, relaying Dr Talmadge message.

## 2024-05-15 NOTE — Telephone Encounter (Signed)
 Noted (see 05/06/24 Nurse Triage note).

## 2024-05-21 ENCOUNTER — Ambulatory Visit

## 2024-05-24 ENCOUNTER — Ambulatory Visit: Payer: Self-pay

## 2024-05-24 NOTE — Telephone Encounter (Signed)
 FYI Only or Action Required?: FYI only for provider.  Patient was last seen in primary care on 05/01/2024 by Rilla Baller, MD.  Called Nurse Triage reporting Headache.  Symptoms began x 2 days.  Interventions attempted: OTC medications: Excedrin.  Symptoms are: unchanged.  Triage Disposition: See Physician Within 24 Hours  Patient/caregiver understands and will follow disposition?: Yes  **Referred to UC; see note below**          Copied from CRM #[id number]. Topic: Clinical - Red Word Triage >> May 24, 2024 12:06 PM Viola F wrote: Red Word that prompted transfer to Nurse Triage: Patient has had severe migraine headache for the last two days , requested a medication Reason for Disposition  [1] MODERATE headache (e.g., interferes with normal activities) AND [2] present > 24 hours AND [3] unexplained  (Exceptions: Pain medicines not tried, typical migraine, or headache part of viral illness.)  Answer Assessment - Initial Assessment Questions 1. LOCATION: Where does it hurt?      Forehead, middle of scalp   2. ONSET: When did the headache start? (e.g., minutes, hours, days)      X 2 days  3. PATTERN: Does the pain come and go, or has it been constant since it started?     Constant   4. SEVERITY: How bad is the pain? and What does it keep you from doing?  (e.g., Scale 1-10; mild, moderate, or severe)     8/10  5. RECURRENT SYMPTOM: Have you ever had headaches before? If Yes, ask: When was the last time? and What happened that time?      Yes, years ago   6. CAUSE: What do you think is causing the headache?     Unsure   7. MIGRAINE: Have you been diagnosed with migraine headaches? If Yes, ask: Is this headache similar?      No   8. HEAD INJURY: Has there been any recent injury to your head?      No   9. OTHER SYMPTOMS: Do you have any other symptoms? (e.g., fever, stiff neck, eye pain, sore throat, cold symptoms)  Neck pain, BIL eye  pain.   For the pain patient is taking Excedrin Migraine, no relief noted. Patient will be seen in UC today as there is no appt. Availability until 10/27. She agrees with plan of care  Protocols used: [facility]

## 2024-05-24 NOTE — Telephone Encounter (Signed)
 Noted. Will await [facility] eval.  Recent aricept commencement by neuro 05/14/2024 - which may be related  Also with HTN hx should be on lisinopril  10mg  daily.

## 2024-05-27 NOTE — Telephone Encounter (Signed)
 I don't see where she was seen for this at [facility].  Can we call for update on status, if not seen schedule OV for eval with available provider?

## 2024-06-04 DIAGNOSIS — D0462 Carcinoma in situ of skin of left upper limb, including shoulder: Secondary | ICD-10-CM | POA: Diagnosis not present

## 2024-06-05 NOTE — Telephone Encounter (Signed)
 LMOM asking for call back.

## 2024-06-28 ENCOUNTER — Other Ambulatory Visit: Payer: Self-pay

## 2024-06-28 ENCOUNTER — Emergency Department
Admission: EM | Admit: 2024-06-28 | Discharge: 2024-06-28 | Disposition: A | Discharge status: Home / Self Care | Attending: Emergency Medicine | Admitting: Emergency Medicine

## 2024-06-28 DIAGNOSIS — R000 Tachycardia, unspecified: Secondary | ICD-10-CM | POA: Insufficient documentation

## 2024-06-28 DIAGNOSIS — K0889 Other specified disorders of teeth and supporting structures: Secondary | ICD-10-CM | POA: Insufficient documentation

## 2024-06-28 LAB — BASIC METABOLIC PANEL WITH GFR
Anion gap: 14 (ref 5–15)
BUN: 12 mg/dL (ref 8–23)
CO2: 30 mmol/L (ref 22–32)
Calcium: 9.5 mg/dL (ref 8.9–10.3)
Chloride: 99 mmol/L (ref 98–111)
Creatinine, Ser: 0.74 mg/dL (ref 0.44–1.00)
GFR, Estimated: >60 mL/min (ref >60)
Glucose, Bld: 99 mg/dL (ref 70–99)
Potassium: 2.5 mmol/L — CL (ref 3.5–5.1)
Sodium: 143 mmol/L (ref 135–145)

## 2024-06-28 LAB — CBC WITH DIFFERENTIAL/PLATELET
Abs Immature Granulocytes: 0.03 K/uL (ref 0.00–0.07)
Basophils Absolute: 0.1 K/uL (ref 0.0–0.1)
Basophils Relative: 1 %
Eosinophils Absolute: 0.1 K/uL (ref 0.0–0.5)
Eosinophils Relative: 1 %
HCT: 40.8 % (ref 36.0–46.0)
Hemoglobin: 13.4 g/dL (ref 12.0–15.0)
Immature Granulocytes: 0 %
Lymphocytes Relative: 17 %
Lymphs Abs: 1.3 K/uL (ref 0.7–4.0)
MCH: 29.9 pg (ref 26.0–34.0)
MCHC: 32.8 g/dL (ref 30.0–36.0)
MCV: 91.1 fL (ref 80.0–100.0)
Monocytes Absolute: 0.5 K/uL (ref 0.1–1.0)
Monocytes Relative: 6 %
Neutro Abs: 5.7 K/uL (ref 1.7–7.7)
Neutrophils Relative %: 75 %
Platelets: 303 K/uL (ref 150–400)
RBC: 4.48 MIL/uL (ref 3.87–5.11)
RDW: 13.4 % (ref 11.5–15.5)
WBC: 7.7 K/uL (ref 4.0–10.5)
nRBC: 0 % (ref 0.0–0.2)

## 2024-06-28 LAB — MAGNESIUM: Magnesium: 2.1 mg/dL (ref 1.7–2.4)

## 2024-06-28 MED ORDER — FENTANYL CITRATE (PF) 50 MCG/ML IJ SOSY
50.0000 ug | PREFILLED_SYRINGE | Freq: Once | INTRAMUSCULAR | Status: AC
  Administered 2024-06-28: 50 ug via INTRAVENOUS
  Filled 2024-06-28: qty 1

## 2024-06-28 MED ORDER — SODIUM CHLORIDE 0.9 % IV BOLUS
1000.0000 mL | Freq: Once | INTRAVENOUS | Status: AC
  Administered 2024-06-28: 1000 mL via INTRAVENOUS

## 2024-06-28 MED ORDER — OXYCODONE-ACETAMINOPHEN 5-325 MG PO TABS
1.0000 | ORAL_TABLET | Freq: Once | ORAL | Status: AC
  Administered 2024-06-28: 1 via ORAL
  Filled 2024-06-28: qty 1

## 2024-06-28 MED ORDER — OXYCODONE-ACETAMINOPHEN 5-325 MG PO TABS: 1.0000 | ORAL_TABLET | Freq: Four times a day (QID) | ORAL | 0 refills | Status: DC | PRN

## 2024-06-28 MED ORDER — POTASSIUM CHLORIDE CRYS ER 20 MEQ PO TBCR
40.0000 meq | EXTENDED_RELEASE_TABLET | Freq: Once | ORAL | Status: AC
  Administered 2024-06-28: 40 meq via ORAL
  Filled 2024-06-28: qty 2

## 2024-06-28 NOTE — ED Provider Notes (Signed)
 [facility] Provider Note    Event Date/Time   First MD Initiated Contact with Patient 06/28/24 1906     (approximate)   History   Dental Pain   HPI  Courtney Thomas is a 78 y.o. female who presents to the emergency department today because of concerns for dental pain.  Pain started about a week ago.  Located in her left lower jaw.  She did go to urgent care was started on antibiotics.  She has been taking this.  Additionally she has been trying Tylenol  and Motrin  for pain control.  The pain however has been persistent.  She states this has led to her having decreased oral intake.  In addition she has developed a headache.  The patient feels like she has had some fevers because she has had chills.  Patient states she has had dental issues in the past although not for a number of years.     Physical Exam   Triage Vital Signs: ED Triage Vitals  Encounter Vitals Group     BP 06/28/24 1857 (!) 180/90     Girls Systolic BP Percentile --      Girls Diastolic BP Percentile --      Boys Systolic BP Percentile --      Boys Diastolic BP Percentile --      Pulse Rate 06/28/24 1858 (!) 143     Resp 06/28/24 1857 18     Temp 06/28/24 1858 97.7 F (36.5 C)     Temp src --      SpO2 --      Weight 06/28/24 1855 105 lb (47.6 kg)     Height 06/28/24 1855 4' 10 (1.473 m)     Head Circumference --      Peak Flow --      Pain Score 06/28/24 1855 10     Pain Loc --      Pain Education --      Exclude from Growth Chart --     Most recent vital signs: Vitals:   06/28/24 1857 06/28/24 1858  BP: (!) 180/90   Pulse:  (!) 143  Resp: 18   Temp:  97.7 F (36.5 C)   General: Awake, alert, oriented. CV:  Good peripheral perfusion. Tachycardia. Resp:  Normal effort. Lungs clear. Abd:  No distention.   ED Results / Procedures / Treatments   Labs (all labs ordered are listed, but only abnormal results are displayed) Labs Reviewed  BASIC METABOLIC PANEL WITH GFR  - Abnormal; Notable for the following components:      Result Value   Potassium 2.5 (*)    All other components within normal limits  CBC WITH DIFFERENTIAL/PLATELET  MAGNESIUM     EKG  I, Guadalupe Eagles, attending physician, personally viewed and interpreted this EKG  EKG Time: 1900 Rate: 121 Rhythm: sinus tachycardia Axis: normal Intervals: qtc 462 QRS: narrow ST changes: no st elevation Impression: abnormal ekg   RADIOLOGY None   PROCEDURES:  Critical Care performed: No  MEDICATIONS ORDERED IN ED: Medications - No data to display   IMPRESSION / MDM / ASSESSMENT AND PLAN / ED COURSE  I reviewed the triage vital signs and the nursing notes.                              Differential diagnosis includes, but is not limited to, dental infection, abscess, cellultis  Patient's presentation is  most consistent with acute presentation with potential threat to life or bodily function.   Patient presented to the emergency department today because concerns for continued left lower jaw and tooth pain.  On exam patient does have poor dentition to that area.  It is slightly tender.  No significant swelling or overlying skin change.  Patient states that because of pain she has not been eating or drinking much so blood work was checked and IV fluids were given.  Blood work does show hypokalemia which is consistent with poor p.o. intake.  Patient was given potassium here in the emergency department.  She did feel better after pain medication.  This time will give patient dental resources and prescription for pain medication.  She is already on antibiotics from urgent care and I encourage patient to continue.     FINAL CLINICAL IMPRESSION(S) / ED DIAGNOSES   Final diagnoses:  Pain, dental    Note:  This document was prepared using Dragon voice recognition software and may include unintentional dictation errors.    Floy Roberts, MD 06/28/24 2329

## 2024-06-28 NOTE — ED Triage Notes (Addendum)
 Pt comes with dental pain. Pt states this started couple weeks ago. Pt states it is getting worse. Pt states left lower dental pain. Pt went to UC few days ago and dx with dental abscess. Pt prescribed meds and per husband has been taking them. Pt states her HR was high that day too  Pt HR reading in 140s. Pt denies any cp or sob. Will perform EKG at this time.

## 2024-07-10 ENCOUNTER — Observation Stay
Admission: EM | Admit: 2024-07-10 | Discharge: 2024-07-15 | Disposition: A | Discharge status: Skilled Nursing Facility | Attending: Emergency Medicine | Admitting: Emergency Medicine

## 2024-07-10 ENCOUNTER — Encounter: Payer: Self-pay | Admitting: Emergency Medicine

## 2024-07-10 ENCOUNTER — Other Ambulatory Visit: Payer: Self-pay

## 2024-07-10 DIAGNOSIS — J439 Emphysema, unspecified: Secondary | ICD-10-CM | POA: Diagnosis present

## 2024-07-10 DIAGNOSIS — G3184 Mild cognitive impairment, so stated: Secondary | ICD-10-CM | POA: Diagnosis not present

## 2024-07-10 DIAGNOSIS — K259 Gastric ulcer, unspecified as acute or chronic, without hemorrhage or perforation: Secondary | ICD-10-CM

## 2024-07-10 DIAGNOSIS — K921 Melena: Secondary | ICD-10-CM | POA: Diagnosis not present

## 2024-07-10 DIAGNOSIS — Z79899 Other long term (current) drug therapy: Secondary | ICD-10-CM | POA: Diagnosis not present

## 2024-07-10 DIAGNOSIS — R531 Weakness: Secondary | ICD-10-CM | POA: Diagnosis not present

## 2024-07-10 DIAGNOSIS — Z87891 Personal history of nicotine dependence: Secondary | ICD-10-CM | POA: Diagnosis not present

## 2024-07-10 DIAGNOSIS — I160 Hypertensive urgency: Secondary | ICD-10-CM | POA: Diagnosis not present

## 2024-07-10 DIAGNOSIS — E876 Hypokalemia: Secondary | ICD-10-CM

## 2024-07-10 DIAGNOSIS — M810 Age-related osteoporosis without current pathological fracture: Secondary | ICD-10-CM | POA: Diagnosis not present

## 2024-07-10 DIAGNOSIS — I10 Essential (primary) hypertension: Secondary | ICD-10-CM | POA: Diagnosis not present

## 2024-07-10 DIAGNOSIS — M47896 Other spondylosis, lumbar region: Secondary | ICD-10-CM | POA: Diagnosis not present

## 2024-07-10 DIAGNOSIS — R197 Diarrhea, unspecified: Secondary | ICD-10-CM | POA: Diagnosis not present

## 2024-07-10 DIAGNOSIS — K922 Gastrointestinal hemorrhage, unspecified: Secondary | ICD-10-CM

## 2024-07-10 DIAGNOSIS — E785 Hyperlipidemia, unspecified: Secondary | ICD-10-CM | POA: Diagnosis not present

## 2024-07-10 LAB — COMPREHENSIVE METABOLIC PANEL WITH GFR
ALT: 9 U/L (ref 0–44)
AST: 12 U/L — ABNORMAL LOW (ref 15–41)
Albumin: 4 g/dL (ref 3.5–5.0)
Alkaline Phosphatase: 78 U/L (ref 38–126)
Anion gap: 11 (ref 5–15)
BUN: 9 mg/dL (ref 8–23)
CO2: 30 mmol/L (ref 22–32)
Calcium: 9.4 mg/dL (ref 8.9–10.3)
Chloride: 101 mmol/L (ref 98–111)
Creatinine, Ser: 0.67 mg/dL (ref 0.44–1.00)
GFR, Estimated: >60 mL/min (ref >60)
Glucose, Bld: 117 mg/dL — ABNORMAL HIGH (ref 70–99)
Potassium: 3 mmol/L — ABNORMAL LOW (ref 3.5–5.1)
Sodium: 141 mmol/L (ref 135–145)
Total Bilirubin: 0.5 mg/dL (ref 0.0–1.2)
Total Protein: 6.5 g/dL (ref 6.5–8.1)

## 2024-07-10 LAB — TYPE AND SCREEN
ABO/RH(D): O POS
Antibody Screen: NEGATIVE

## 2024-07-10 LAB — HEMOGLOBIN AND HEMATOCRIT, BLOOD
HCT: 34.8 % — ABNORMAL LOW (ref 36.0–46.0)
Hemoglobin: 11.7 g/dL — ABNORMAL LOW (ref 12.0–15.0)

## 2024-07-10 LAB — CBC
HCT: 40.1 % (ref 36.0–46.0)
Hemoglobin: 13.3 g/dL (ref 12.0–15.0)
MCH: 30 pg (ref 26.0–34.0)
MCHC: 33.2 g/dL (ref 30.0–36.0)
MCV: 90.5 fL (ref 80.0–100.0)
Platelets: 264 K/uL (ref 150–400)
RBC: 4.43 MIL/uL (ref 3.87–5.11)
RDW: 13 % (ref 11.5–15.5)
WBC: 7.4 K/uL (ref 4.0–10.5)
nRBC: 0 % (ref 0.0–0.2)

## 2024-07-10 LAB — TROPONIN T, HIGH SENSITIVITY: Troponin T High Sensitivity: <15 ng/L (ref 0–19)

## 2024-07-10 LAB — URINALYSIS, ROUTINE W REFLEX MICROSCOPIC
Bilirubin Urine: NEGATIVE
Glucose, UA: NEGATIVE mg/dL
Hgb urine dipstick: NEGATIVE
Ketones, ur: NEGATIVE mg/dL
Leukocytes,Ua: NEGATIVE
Nitrite: NEGATIVE
Protein, ur: NEGATIVE mg/dL
Specific Gravity, Urine: 1.006 (ref 1.005–1.030)
pH: 7 (ref 5.0–8.0)

## 2024-07-10 LAB — MAGNESIUM
Magnesium: 1.9 mg/dL (ref 1.7–2.4)
Magnesium: 1.8 mg/dL (ref 1.7–2.4)

## 2024-07-10 LAB — CK: Total CK: 47 U/L (ref 38–234)

## 2024-07-10 LAB — PHOSPHORUS: Phosphorus: 2.5 mg/dL (ref 2.5–4.6)

## 2024-07-10 MED ORDER — SENNOSIDES-DOCUSATE SODIUM 8.6-50 MG PO TABS: 1.0000 | ORAL_TABLET | Freq: Every evening | ORAL | Status: DC | PRN

## 2024-07-10 MED ORDER — SODIUM CHLORIDE 0.9 % IV BOLUS
1000.0000 mL | Freq: Once | INTRAVENOUS | Status: AC
  Administered 2024-07-10: 1000 mL via INTRAVENOUS

## 2024-07-10 MED ORDER — ACETAMINOPHEN 325 MG PO TABS
650.0000 mg | ORAL_TABLET | Freq: Four times a day (QID) | ORAL | Status: DC | PRN
  Administered 2024-07-11 – 2024-07-13 (×3): 650 mg via ORAL
  Filled 2024-07-10 (×3): qty 2

## 2024-07-10 MED ORDER — PANTOPRAZOLE SODIUM 40 MG IV SOLR
40.0000 mg | Freq: Two times a day (BID) | INTRAVENOUS | Status: DC
  Administered 2024-07-10 – 2024-07-12 (×4): 40 mg via INTRAVENOUS
  Filled 2024-07-10 (×4): qty 10

## 2024-07-10 MED ORDER — POTASSIUM CHLORIDE 10 MEQ/100ML IV SOLN
10.0000 meq | Freq: Once | INTRAVENOUS | Status: AC
  Administered 2024-07-10: 10 meq via INTRAVENOUS
  Filled 2024-07-10: qty 100

## 2024-07-10 MED ORDER — ACETAMINOPHEN 650 MG RE SUPP: 650.0000 mg | Freq: Four times a day (QID) | RECTAL | Status: DC | PRN

## 2024-07-10 MED ORDER — ONDANSETRON HCL 4 MG PO TABS
4.0000 mg | ORAL_TABLET | Freq: Four times a day (QID) | ORAL | Status: DC | PRN
  Administered 2024-07-15: 11:00:00 4 mg via ORAL
  Filled 2024-07-10: qty 1

## 2024-07-10 MED ORDER — ONDANSETRON HCL 4 MG/2ML IJ SOLN: 4.0000 mg | Freq: Four times a day (QID) | INTRAMUSCULAR | Status: DC | PRN

## 2024-07-10 MED ORDER — SODIUM CHLORIDE 0.9% FLUSH
3.0000 mL | Freq: Two times a day (BID) | INTRAVENOUS | Status: DC
  Administered 2024-07-10 – 2024-07-14 (×8): 3 mL via INTRAVENOUS

## 2024-07-10 MED ORDER — PANTOPRAZOLE SODIUM 40 MG IV SOLR
80.0000 mg | Freq: Once | INTRAVENOUS | Status: AC
  Administered 2024-07-10: 80 mg via INTRAVENOUS
  Filled 2024-07-10: qty 20

## 2024-07-10 MED ORDER — LACTATED RINGERS IV SOLN: INTRAVENOUS | Status: AC

## 2024-07-10 NOTE — H&P (Signed)
 History and Physical    [name]:[id number] DOB: October 11, 1945 DOA: 07/10/2024  DOS: the patient was seen and examined on 07/10/2024  PCP: Rilla Baller, MD   Patient coming from: Home  I have personally briefly reviewed patient's old medical records in [facility] and CareEverywhere  HPI:   Courtney Thomas is a 78 y.o. year old female with medical history of hypertension, hypertension, COPD, osteoporosis, mild cognitive impairment presenting to the ED with concern of generalized weakness and dark tarry stools.  Patient reports she has been having diarrhea for 1 week after she completed antibiotics for an oral infection.  She is sleeping on my interview and answers questions after multiple attempts.  Her partner is at bedside who gives further history stating she has been having diarrhea but yesterday she reported some darkening of her stools and another episode today.  She has been weak during this time as well.  Patient has not had any fevers or chills or any other URI symptoms.   On arrival to the ED patient was noted to be HDS stable.  Lab work obtained.  CBC without any acute findings.  CMP with mild hypokalemia and otherwise unremarkable.  Magnesium and phosphorus checked and normal.  CK checked and normal.  Troponin normal.  Given patient's weakness and melenic stools, [facility] contacted for admission.  Review of Systems: As mentioned in the history of present illness. All other systems reviewed and are negative.   Past Medical History:  Diagnosis Date   Adjustment disorder with depressed mood    Asthma    B12 deficiency anemia 2014   mild   Frequent headaches    History of chicken pox    Hyperlipidemia    Hypertension    Insomnia    Osteoporosis    latest dexa 05/2016 T -3.3 spine (unchanged), -2.0 hip - on fosamax  since ~2015   Seasonal allergic rhinitis     Past Surgical History:  Procedure Laterality Date   ABDOMINAL HYSTERECTOMY     BREAST CYST  ASPIRATION Left 80s   benign   BREAST EXCISIONAL BIOPSY Right 1971   neg   COLONOSCOPY  07/2013   diverticulosis, erythematous mucosa in sigmoid - biopsy with focal vascular congestion/edema without colitis or dysplasia ([facility])   COLONOSCOPY N/A 09/22/2022   poor prep, rpt 6 mo [place])   dexa  03/2014   T score spine -3.5, hip -2.0   ESOPHAGOGASTRODUODENOSCOPY N/A 09/22/2022   nonbleeding gastric ulcers with clean base, duodenal dvierticulum, schatzki ring dilated, biopsy with mild chronic gastritis, H pylori neg ([place])   NASAL SINUS SURGERY     deviated septum   TONSILLECTOMY  1970   TOTAL ABDOMINAL HYSTERECTOMY W/ BILATERAL SALPINGOOPHORECTOMY  1994   uterine cyst with heavy bleeding     Allergies  Allergen Reactions   Egg Protein-Containing Drug Products Anaphylaxis and Swelling   Codeine Nausea Only   Penicillins Rash    .p     Family History  Problem Relation Age of Onset   Cancer Mother 62       lung met to brain, smoker   Stroke Mother    Alzheimer's disease Mother    Arthritis Father    Arthritis Sister    Cancer Brother 27       pancreatic   Diabetes Maternal Grandmother    Breast cancer Neg Hx     Prior to Admission medications   Medication Sig Start Date End Date Taking? Authorizing Provider  donepezil (ARICEPT) 5 MG tablet Take 5 mg by mouth at bedtime. 05/14/24 05/14/25 Yes [provider]  albuterol  (VENTOLIN  HFA) 108 (90 Base) MCG/ACT inhaler Inhale 2 puffs into the lungs every 6 (six) hours as needed for wheezing or shortness of breath. 05/01/24   Rilla Baller, MD  atorvastatin  (LIPITOR) 10 MG tablet Take 1 tablet (10 mg total) by mouth daily. For cholesterol 05/01/24   Rilla Baller, MD  benzonatate  (TESSALON ) 100 MG capsule Take 1 capsule (100 mg total) by mouth 3 (three) times daily as needed for cough. 05/06/24   Rilla Baller, MD  budesonide -formoterol  (SYMBICORT ) 80-4.5 MCG/ACT inhaler Inhale 2 puffs into the lungs in the  morning and at bedtime. 05/01/24   Rilla Baller, MD  buPROPion  ER (WELLBUTRIN  SR) 100 MG 12 hr tablet Take 1 tablet (100 mg total) by mouth daily. For mood 05/01/24   Rilla Baller, MD  Cholecalciferol (VITAMIN D3) 25 MCG (1000 UT) CAPS Take 1 capsule (1,000 Units total) by mouth daily. 05/01/24   Rilla Baller, MD  cyanocobalamin  (VITAMIN B12) 500 MCG tablet Take 1 tablet (500 mcg total) by mouth every Monday, Wednesday, and Friday. 05/01/24   Rilla Baller, MD  DULoxetine  (CYMBALTA ) 30 MG capsule Take 1 capsule (30 mg total) by mouth daily. For mood 05/01/24   Rilla Baller, MD  lisinopril  (ZESTRIL ) 10 MG tablet Take 1 tablet (10 mg total) by mouth daily. 05/01/24   Rilla Baller, MD  oxyCODONE -acetaminophen  (PERCOCET) 5-325 MG tablet Take 1 tablet by mouth every 6 (six) hours as needed for severe pain (pain score 7-10). 06/28/24 06/28/25  Goodman, Graydon, MD  PROLIA  60 MG/ML SOSY injection Inject into the skin. 12/06/22   [provider]  propranolol  (INDERAL ) 10 MG tablet Take 1 tablet (10 mg total) by mouth 2 (two) times daily. For tremor 05/01/24   Rilla Baller, MD  traZODone  (DESYREL ) 50 MG tablet Take 1 tablet (50 mg total) by mouth at bedtime. 05/01/24   Rilla Baller, MD    Social History:  reports that she has quit smoking. She has never used smokeless tobacco. She reports that she does not drink alcohol and does not use drugs.    Physical Exam: Vitals:   07/10/24 0945 07/10/24 0946  BP:  (!) 160/79  Pulse:  84  Resp:  18  Temp:  98 F (36.7 C)  TempSrc:  Oral  SpO2: 95% 94%  Weight:  47.6 kg  Height:  4' 10 (1.473 m)    Gen: NAD HENT: NCAT CV: normal heart sounds Lung: CTAB Abd: No TTP, normal bowel sounds MSK: No asymmetry, decreased bulk and tone Neuro: alert and oriented, CN II-XII grossly intact, no focal deficits   Labs on Admission: I have personally reviewed following labs and imaging studies  CBC: Recent Labs  Lab  07/10/24 0949  WBC 7.4  HGB 13.3  HCT 40.1  MCV 90.5  PLT 264   Basic Metabolic Panel: Recent Labs  Lab 07/10/24 0948 07/10/24 0949  NA  --  141  K  --  3.0*  CL  --  101  CO2  --  30  GLUCOSE  --  117*  BUN  --  9  CREATININE  --  0.67  CALCIUM   --  9.4  MG 1.9  --   PHOS 2.5  --    GFR: Estimated Creatinine Clearance: 38.6 mL/min (by C-G formula based on SCr of 0.67 mg/dL). Liver Function Tests: Recent Labs  Lab 07/10/24 0949  AST  12*  ALT 9  ALKPHOS 78  BILITOT 0.5  PROT 6.5  ALBUMIN 4.0   No results for input(s): LIPASE, AMYLASE in the last 168 hours. No results for input(s): AMMONIA in the last 168 hours. Coagulation Profile: No results for input(s): INR, PROTIME in the last 168 hours. Cardiac Enzymes: Recent Labs  Lab 07/10/24 1000  CKTOTAL 47   BNP (last 3 results) No results for input(s): BNP in the last 8760 hours. HbA1C: No results for input(s): HGBA1C in the last 72 hours. CBG: No results for input(s): GLUCAP in the last 168 hours. Lipid Profile: No results for input(s): CHOL, HDL, LDLCALC, TRIG, CHOLHDL, LDLDIRECT in the last 72 hours. Thyroid  Function Tests: No results for input(s): TSH, T4TOTAL, FREET4, T3FREE, THYROIDAB in the last 72 hours. Anemia Panel: No results for input(s): VITAMINB12, FOLATE, FERRITIN, TIBC, IRON , RETICCTPCT in the last 72 hours. Urine analysis:    Component Value Date/Time   COLORURINE STRAW (A) 07/10/2024 1230   APPEARANCEUR CLEAR (A) 07/10/2024 1230   LABSPEC 1.006 07/10/2024 1230   PHURINE 7.0 07/10/2024 1230   GLUCOSEU NEGATIVE 07/10/2024 1230   GLUCOSEU NEGATIVE 05/24/2022 1547   HGBUR NEGATIVE 07/10/2024 1230   BILIRUBINUR NEGATIVE 07/10/2024 1230   BILIRUBINUR negative 01/09/2024 1238   KETONESUR NEGATIVE 07/10/2024 1230   PROTEINUR NEGATIVE 07/10/2024 1230   UROBILINOGEN 0.2 01/09/2024 1238   UROBILINOGEN 2.0 (A) 05/24/2022 1547   NITRITE  NEGATIVE 07/10/2024 1230   LEUKOCYTESUR NEGATIVE 07/10/2024 1230    Radiological Exams on Admission: I have personally reviewed images No results found.  EKG: My personal interpretation of EKG shows: NSR without any acute ST changes.  Assessment/Plan Principal Problem:   Generalized weakness Active Problems:   Hypertension   Hyperlipidemia   Osteoporosis   Osteoarthritis of lumbar spine   Emphysema of lung (HCC)   Melena   Patient with generalized weakness likely secondary to her diarrhea.  This may be secondary to her antibiotic use versus gastroenteritis.  Will get GI panel and C. difficile.  Will continue supportive care.  Replace PT and OT consult  Hypertension: Patient is hypertensive and will restart her home lisinopril .  Hyperlipidemia: Continue home med  Emphysema: Continue home inhalers  Melena?  It to be concerned about upper GI bleed.  My concern given patient's history and lab findings is lower for this.  Will monitor H&H without any PPI and if he has significant decline in H&H, then that would increase my suspicion for this.  She may have slight decrease as she will be getting IVF.  She does need colonoscopy outpatient as last 1 was a poor study and it was recommended that she get repeated 6 months later.  If patient has significant decline in her hemoglobin, would consult with GI but having this can be safely deferred as patient's hemoglobin is at baseline currently.  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 with meds Code Status:  Full Code Telemetry:  Admission status: Observation, Telemetry bed Patient is from: Home Anticipated d/c is to: Home Anticipated d/c is in: 1-2 days   Family Communication: Updated at bedside  Consults called: None   Severity of Illness: The appropriate patient status for this patient is OBSERVATION. Observation status is judged to be reasonable and necessary in order to provide the  required intensity of service to ensure the patient's safety. The patient's presenting symptoms, physical exam findings, and initial radiographic and laboratory data in the context of their  medical condition is felt to place them at decreased risk for further clinical deterioration. Furthermore, it is anticipated that the patient will be medically stable for discharge from the hospital within 2 midnights of admission.    Morene Bathe, MD [facility] DEL[facility]
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
## 2024-07-10 NOTE — ED Notes (Signed)
 See triage note. Pt boyfriend reports pt has been more confused the past few days with difficulty ambulating d/t weakness. Also reports increased BM with dark color.

## 2024-07-10 NOTE — ED Triage Notes (Addendum)
 Per EMS pt coming from home c/o generalized weakness ongoing for quite a while. C/o nausea and emesis. Family states patient is non compliant with medications.   Patients adds that her boyfriend called ems this morning because patient was too weak to get off the ground. States she just sat herself down and did not fall.

## 2024-07-10 NOTE — ED Notes (Signed)
 Pt had large BM. This tech, Massie BANNING, and [place] EDT changed pt. Pt was wiped down with warm bath wipes, new brief and chux pads were placed, and pt was repositioned in bed. No other needs verbalized at this time. Fall bundle in place

## 2024-07-11 LAB — CBC
HCT: 36.6 % (ref 36.0–46.0)
Hemoglobin: 12 g/dL (ref 12.0–15.0)
MCH: 29.7 pg (ref 26.0–34.0)
MCHC: 32.8 g/dL (ref 30.0–36.0)
MCV: 90.6 fL (ref 80.0–100.0)
Platelets: 209 K/uL (ref 150–400)
RBC: 4.04 MIL/uL (ref 3.87–5.11)
RDW: 13 % (ref 11.5–15.5)
WBC: 4.8 K/uL (ref 4.0–10.5)
nRBC: 0 % (ref 0.0–0.2)

## 2024-07-11 LAB — BASIC METABOLIC PANEL WITH GFR
Anion gap: 13 (ref 5–15)
BUN: 5 mg/dL — ABNORMAL LOW (ref 8–23)
CO2: 27 mmol/L (ref 22–32)
Calcium: 8.8 mg/dL — ABNORMAL LOW (ref 8.9–10.3)
Chloride: 102 mmol/L (ref 98–111)
Creatinine, Ser: 0.64 mg/dL (ref 0.44–1.00)
GFR, Estimated: >60 mL/min (ref >60)
Glucose, Bld: 85 mg/dL (ref 70–99)
Potassium: 2.5 mmol/L — CL (ref 3.5–5.1)
Sodium: 142 mmol/L (ref 135–145)

## 2024-07-11 LAB — HEMOGLOBIN AND HEMATOCRIT, BLOOD
HCT: 37.4 % (ref 36.0–46.0)
Hemoglobin: 12.1 g/dL (ref 12.0–15.0)

## 2024-07-11 LAB — GLUCOSE, CAPILLARY: Glucose-Capillary: 89 mg/dL (ref 70–99)

## 2024-07-11 MED ORDER — PROPRANOLOL HCL 10 MG PO TABS
10.0000 mg | ORAL_TABLET | Freq: Two times a day (BID) | ORAL | Status: DC
  Administered 2024-07-11 – 2024-07-15 (×9): 10 mg via ORAL
  Filled 2024-07-11 (×9): qty 1

## 2024-07-11 MED ORDER — TRAZODONE HCL 50 MG PO TABS
50.0000 mg | ORAL_TABLET | Freq: Every day | ORAL | Status: DC
  Administered 2024-07-11 – 2024-07-14 (×4): 50 mg via ORAL
  Filled 2024-07-11 (×4): qty 1

## 2024-07-11 MED ORDER — MAGNESIUM SULFATE 2 GM/50ML IV SOLN
2.0000 g | Freq: Once | INTRAVENOUS | Status: AC
  Administered 2024-07-11: 2 g via INTRAVENOUS
  Filled 2024-07-11: qty 50

## 2024-07-11 MED ORDER — OXYCODONE-ACETAMINOPHEN 5-325 MG PO TABS: 1.0000 | ORAL_TABLET | Freq: Four times a day (QID) | ORAL | Status: DC | PRN

## 2024-07-11 MED ORDER — POTASSIUM CHLORIDE 20 MEQ PO PACK: 40.0000 meq | PACK | Freq: Once | ORAL | Status: DC

## 2024-07-11 MED ORDER — BUPROPION HCL ER (SR) 100 MG PO TB12
100.0000 mg | ORAL_TABLET | Freq: Every day | ORAL | Status: DC
  Administered 2024-07-11 – 2024-07-15 (×5): 100 mg via ORAL
  Filled 2024-07-11 (×5): qty 1

## 2024-07-11 MED ORDER — DONEPEZIL HCL 5 MG PO TABS
5.0000 mg | ORAL_TABLET | Freq: Every day | ORAL | Status: DC
  Administered 2024-07-11 – 2024-07-14 (×4): 5 mg via ORAL
  Filled 2024-07-11 (×4): qty 1

## 2024-07-11 MED ORDER — POTASSIUM CHLORIDE 10 MEQ/100ML IV SOLN
10.0000 meq | INTRAVENOUS | Status: AC
  Administered 2024-07-11 (×6): 10 meq via INTRAVENOUS
  Filled 2024-07-11 (×6): qty 100

## 2024-07-11 MED ORDER — DULOXETINE HCL 30 MG PO CPEP
30.0000 mg | ORAL_CAPSULE | Freq: Every day | ORAL | Status: DC
  Administered 2024-07-11 – 2024-07-15 (×5): 30 mg via ORAL
  Filled 2024-07-11 (×5): qty 1

## 2024-07-11 MED ORDER — LISINOPRIL 10 MG PO TABS
10.0000 mg | ORAL_TABLET | Freq: Every day | ORAL | Status: DC
  Administered 2024-07-11 – 2024-07-12 (×2): 10 mg via ORAL
  Filled 2024-07-11 (×2): qty 1

## 2024-07-11 NOTE — TOC PASRR Note (Signed)
 30 Day PASRR Note   Patient Details  Name: Courtney Thomas Date of Birth: 1946/07/18   Transition of Care [facility]) CM/SW Contact:    [name], RN Phone Number: 07/11/2024, 4:34 PM  To Whom It May Concern:  Please be advised that this patient will require a short-term nursing home stay - anticipated 30 days or less for rehabilitation and strengthening.   The plan is for return home.

## 2024-07-11 NOTE — Progress Notes (Signed)
 PHARMACY CONSULT NOTE - ELECTROLYTES  Pharmacy Consult for Electrolyte Monitoring and Replacement   Recent Labs: Height: 4' 10 (147.3 cm) Weight: 42.7 kg (94 lb 2.2 oz) IBW/kg (Calculated) : 40.9 Estimated Creatinine Clearance: 38.6 mL/min (by C-G formula based on SCr of 0.64 mg/dL). Potassium (mmol/L)  Date Value  07/11/2024 2.5 (LL)   Magnesium (mg/dL)  Date Value  87/89/7974 1.8   Calcium  (mg/dL)  Date Value  87/88/7974 8.8 (L)   Albumin  Date Value  07/10/2024 4.0 g/dL  89/98/7985 4.4   Phosphorus (mg/dL)  Date Value  87/89/7974 2.5   Sodium (mmol/L)  Date Value  07/11/2024 142   Corrected Ca: 8.8 mg/dL  Assessment  Courtney Thomas is a 78 y.o. female presenting with generalized weakness and dark tarry stools. PMH significant for hypertension, COPD, osteoporosis, and mild cognitive impairment. Pharmacy has been consulted to monitor and replace electrolytes.  Diet: NPO MIVF: LR @ 100 mL/hr Pertinent medications: N/A  Goal of Therapy: Electrolytes WNL  Plan:  MD ordered KCL 40 mEq PO x 1 and KCL 10 mEq IV x 6 Mag 1.8 last night, not replaced- will order Mag sulfate 2 g IV x 1 Check BMP, Mg, Phos with AM labs  Thank you for allowing pharmacy to be a part of this patient's care.  Lum VEAR Mania, PharmD, BCPS Clinical Pharmacist 07/11/2024 9:22 AM

## 2024-07-11 NOTE — TOC Initial Note (Signed)
 Transition of Care [facility]) - Initial/Assessment Note    Patient Details  Name: Courtney Thomas MRN: [id number] Date of Birth: June 03, 1946  Transition of [facility]) CM/SW Contact:    [name], RN Phone Number: 07/11/2024, 4:08 PM  Clinical Narrative:                 RNCM met with patient. No family member at bedside. Patient lying in hospital bed, eyes closed. Patient said she did not want to talk to anyone. Said she lives with Marcey.  When asked who Marcey was she did not answer. Asked if Marcey was her son or husband she said no. When asked if Marcey was her boyfriend, she said yes. Advised her the physical therapists that worked with her earlier think she should go to a skilled nursing facility for a few weeks to get rehab so she can be stronger. Patient said, I don't want to stay there forever.  RNCM confirmed that it would just be for a few weeks. Patient said she would go. Asked if I could talk to Marcey about it also and patient said, yes. She then asked if we could finish later.  Advised her we would try again tomorrow. Patient verbalized understanding.   Expected Discharge Plan: Skilled Nursing Facility Barriers to Discharge: Continued Medical Work up   Patient Goals and CMS Choice            Expected Discharge Plan and Services In-house Referral: Clinical Social Work Discharge Planning Services: CM Consult                                          Prior Living Arrangements/Services   Lives with:: Significant Other Patient language and need for interpreter reviewed:: Yes Do you feel safe going back to the place where you live?: Yes      Need for Family Participation in Patient Care: Yes (Comment) Care giver support system in place?: Yes (comment)   Criminal Activity/Legal Involvement Pertinent to Current Situation/Hospitalization: No - Comment as needed  Activities of Daily Living      Permission Sought/Granted Permission sought to share information  with : Case Manager, Family Supports Permission granted to share information with : Yes, Verbal Permission Granted  Share Information with NAME: [name]     Permission granted to share info w Relationship: Friend  Permission granted to share info w Contact Information: [phone]  Emotional Assessment   Attitude/Demeanor/Rapport: Avoidant Affect (typically observed): Guarded Orientation: : Oriented to Self, Oriented to Place Alcohol / Substance Use: Not Applicable    Admission diagnosis:  Melena [K92.1] Hypokalemia [E87.6] Weakness [R53.1] Gastrointestinal hemorrhage, unspecified gastrointestinal hemorrhage type [K92.2] Patient Active Problem List   Diagnosis Date Noted   Melena 07/10/2024   Generalized weakness 07/10/2024   Syncope 01/11/2024   Mild cognitive impairment 01/11/2024   Gastric ulcer 01/02/2023   Schatzki's ring 06/17/2022   IDA (iron  deficiency anemia) 11/30/2021   Decreased visual acuity 11/30/2021   Thoracic aorta atherosclerosis 03/17/2018   Emphysema of lung (HCC) 03/17/2018   History of allergy to eggs 04/01/2016   Tremor 12/08/2015   Advanced care planning/counseling discussion 03/17/2015   Health care maintenance 03/17/2015   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 [place] [address] Phone: [phone] Fax: [phone]     Social Drivers of Health (SDOH) Social History: SDOH Screenings   Food Insecurity: No Food Insecurity (07/11/2024)  Housing: Unknown (07/11/2024)  Transportation Needs: Patient Unable To Answer (07/11/2024)  Utilities: Patient Unable To Answer  (07/11/2024)  Alcohol Screen: Low Risk (08/30/2022)  Depression (PHQ2-9): Medium Risk (05/01/2024)  Financial Resource Strain: Low Risk  (02/20/2024)   [facility] from [facility]  Physical Activity: Inactive (08/30/2022)  Social Connections: Patient Unable To Answer (07/11/2024)  Stress: No Stress Concern Present (08/30/2022)  Tobacco Use: Medium Risk (07/10/2024)   SDOH Interventions:     Readmission Risk Interventions     No data to display

## 2024-07-11 NOTE — Evaluation (Signed)
 Occupational Therapy Evaluation Patient Details Name: Courtney Thomas MRN: [id number] DOB: [birth year] Today's Date: 07/11/2024   History of Present Illness   Pt is a 78 year old female admitted with generalized wakness secondary to diarrhea    PMH significant for hypertension, hypertension, COPD, osteoporosis, mild cognitive impairment     Clinical Impressions Chart reviewed to date, pt greeted semi supine, initially lethargic but improved with position changes. Pt reports she is generally MOD I- I in Adl/IADL, amb with no AD. She reports she does not drive. Pt presents with deficits in strength, endurance, activity tolerance, balance, cognition, affecting safe and optimal ADL completion. Frequent multi modal cues provided throughout for one step direction following. Please see further details below. PT in room at end of OT session for hand off during additional mobility attempts. Pt is performing ADL below PLOF, will benefit from acute OT to address functional deficits and to facilitate optimal ADL/functional mobility performance.      If plan is discharge home, recommend the following:   A little help with walking and/or transfers;A little help with bathing/dressing/bathroom;Help with stairs or ramp for entrance;Assistance with cooking/housework     Functional Status Assessment   Patient has had a recent decline in their functional status and demonstrates the ability to make significant improvements in function in a reasonable and predictable amount of time.     Equipment Recommendations   Other (comment) (defer to next venue of care)     Recommendations for Other Services         Precautions/Restrictions   Precautions Precautions: Fall Recall of Precautions/Restrictions: Impaired Restrictions Weight Bearing Restrictions Per Provider Order: No     Mobility Bed Mobility Overal bed mobility: Needs Assistance Bed Mobility: Supine to Sit, Sit to Supine     Supine  to sit: Min assist, HOB elevated Sit to supine: Min assist        Transfers Overall transfer level: Needs assistance Equipment used: Rolling walker (2 wheels) Transfers: Sit to/from Stand Sit to Stand: Min assist                  Balance Overall balance assessment: Needs assistance Sitting-balance support: Feet supported       Standing balance support: Bilateral upper extremity supported, During functional activity, Reliant on assistive device for balance Standing balance-Leahy Scale: Poor                             ADL either performed or assessed with clinical judgement   ADL Overall ADL's : Needs assistance/impaired                     Lower Body Dressing: Minimal assistance;Sitting/lateral leans Lower Body Dressing Details (indicate cue type and reason): doff socks, donn slippers Toilet Transfer: Minimal assistance;Ambulation;Rolling walker (2 wheels) Toilet Transfer Details (indicate cue type and reason): simulated, frequent multi modal cues for technique         Functional mobility during ADLs: Minimal assistance;Rolling walker (2 wheels);Cueing for sequencing;Cueing for safety (approx 4' in room)  Frequent multi modal cues for technique/one step direction following      Vision Patient Visual Report: No change from baseline Additional Comments: will continue to assess     Perception         Praxis         Pertinent Vitals/Pain Pain Assessment Pain Assessment: 0-10 Pain Score: 10-Worst pain ever Pain Location: head Pain Descriptors /  Indicators: Headache Pain Intervention(s): Monitored during session, Repositioned (notified nurse)     Extremity/Trunk Assessment Upper Extremity Assessment Upper Extremity Assessment: Generalized weakness   Lower Extremity Assessment Lower Extremity Assessment: Generalized weakness   Cervical / Trunk Assessment Cervical / Trunk Assessment: Normal   Communication  Communication Communication: No apparent difficulties   Cognition Arousal: Lethargic Behavior During Therapy: Flat affect Cognition: Cognition impaired, No family/caregiver present to determine baseline   Orientation impairments: Situation     Attention impairment (select first level of impairment): Focused attention Executive functioning impairment (select all impairments): Reasoning, Problem solving OT - Cognition Comments: improved with position changes                 Following commands: Impaired Following commands impaired: Follows one step commands with increased time, frequent multi modal cues     Cueing  General Comments   Cueing Techniques: Verbal cues  vss on RA   Exercises Other Exercises Other Exercises: edu re role of OT, role of rehab, discharge recommendations   Shoulder Instructions      Home Living Family/patient expects to be discharged to:: Private residence Living Arrangements: Alone Available Help at Discharge: Available PRN/intermittently;Friend(s) Type of Home: House Home Access: Stairs to enter [employer] of Steps: 12 Entrance Stairs-Rails: Right;Left Home Layout: One level Alternate Level Stairs-Number of Steps: 12             Home Equipment: None          Prior Functioning/Environment Prior Level of Function : Needs assist             Mobility Comments: amb with no AD ADLs Comments: MOD I for ADL, does not drive, significant other assists    OT Problem List: Decreased strength;Decreased activity tolerance;Impaired balance (sitting and/or standing);Decreased safety awareness;Decreased cognition;Decreased knowledge of use of DME or AE   OT Treatment/Interventions: Self-care/ADL training;Therapeutic exercise;Energy conservation;DME and/or AE instruction;Patient/family education;Cognitive remediation/compensation;Therapeutic activities;Balance training      OT Goals(Current goals can be found in the care  plan section)   Acute Rehab OT Goals Patient Stated Goal: feel stronger OT Goal Formulation: With patient Time For Goal Achievement: 07/25/24 Potential to Achieve Goals: Good ADL Goals Pt Will Perform Grooming: with modified independence;sitting;standing Pt Will Perform Lower Body Dressing: with modified independence;sitting/lateral leans;sit to/from stand Pt Will Transfer to Toilet: with modified independence;ambulating Pt Will Perform Toileting - Clothing Manipulation and hygiene: with modified independence;sitting/lateral leans;sit to/from stand   OT Frequency:  Min 2X/week    Co-evaluation              AM-PAC OT 6 Clicks Daily Activity     Outcome Measure Help from another person eating meals?: A Little Help from another person taking care of personal grooming?: A Little Help from another person toileting, which includes using toliet, bedpan, or urinal?: A Lot Help from another person bathing (including washing, rinsing, drying)?: A Lot Help from another person to put on and taking off regular upper body clothing?: A Little Help from another person to put on and taking off regular lower body clothing?: A Little 6 Click Score: 16   End of Session Equipment Utilized During Treatment: Rolling walker (2 wheels) Nurse Communication:  (headache)  Activity Tolerance: Patient tolerated treatment well Patient left: in bed;with call bell/phone within reach (in care of PT)  OT Visit Diagnosis: Other abnormalities of gait and mobility (R26.89);Muscle weakness (generalized) (M62.81)  Time: 8996-8978 OT Time Calculation (min): 18 min Charges:  OT General Charges $OT Visit: 1 Visit OT Evaluation $OT Eval Moderate Complexity: 1 Mod  Therisa Sheffield, OTD OTR/L  07/11/2024, 11:44 AM

## 2024-07-11 NOTE — Plan of Care (Signed)
  Problem: Clinical Measurements: Goal: Ability to maintain clinical measurements within normal limits will improve Outcome: Progressing Goal: Will remain free from infection Outcome: Progressing Goal: Diagnostic test results will improve Outcome: Progressing Goal: Respiratory complications will improve Outcome: Progressing Goal: Cardiovascular complication will be avoided Outcome: Progressing   Problem: Coping: Goal: Level of anxiety will decrease Outcome: Progressing   Problem: Pain Managment: Goal: General experience of comfort will improve and/or be controlled Outcome: Progressing   Problem: Safety: Goal: Ability to remain free from injury will improve Outcome: Progressing

## 2024-07-11 NOTE — Evaluation (Signed)
 Physical Therapy Evaluation Patient Details Name: Courtney Thomas MRN: [id number] DOB: 1945/10/29 Today's Date: 07/11/2024  History of Present Illness  Courtney Thomas is a 78 y.o. year old female with medical history of hypertension, hypertension, COPD, osteoporosis, mild cognitive impairment presenting to the ED with concern of generalized weakness and dark tarry stools.  Clinical Impression  Patient received in bed, OT present in room. Patient reports headache. She is agreeable to PT assessment. Patient is mod I for bed mobility although requires increased time, cues and effort to complete. She is able to stand with min A and took ~2 steps at edge of bed with min A. Unsteady in standing. Patient is limited by headache and fatigue. She will continue to benefit from skilled PT to improve strength and independence.         If plan is discharge home, recommend the following: A lot of help with walking and/or transfers;A lot of help with bathing/dressing/bathroom   Can travel by private vehicle   Yes    Equipment Recommendations Rolling walker (2 wheels)  Recommendations for Other Services       Functional Status Assessment Patient has had a recent decline in their functional status and demonstrates the ability to make significant improvements in function in a reasonable and predictable amount of time.     Precautions / Restrictions Precautions Precautions: Fall Recall of Precautions/Restrictions: Intact Restrictions Weight Bearing Restrictions Per Provider Order: No      Mobility  Bed Mobility Overal bed mobility: Modified Independent             General bed mobility comments: increased time and effort needed    Transfers Overall transfer level: Needs assistance Equipment used: None Transfers: Sit to/from Stand Sit to Stand: Min assist           General transfer comment: unsteady in standing    Ambulation/Gait Ambulation/Gait assistance: Min assist Gait  Distance (Feet): 2 Feet Assistive device: 1 person hand held assist Gait Pattern/deviations: Step-to pattern Gait velocity: decr     General Gait Details: patient took a couple of lateral steps at edge of bed with min A due to unsteadiness  Stairs            Wheelchair Mobility     Tilt Bed    Modified Rankin (Stroke Patients Only)       Balance Overall balance assessment: Needs assistance Sitting-balance support: Feet supported Sitting balance-Leahy Scale: Fair     Standing balance support: Single extremity supported, During functional activity, Reliant on assistive device for balance Standing balance-Leahy Scale: Poor                               Pertinent Vitals/Pain Pain Assessment Pain Assessment: Faces Faces Pain Scale: Hurts a little bit Pain Descriptors / Indicators: Headache    Home Living Family/patient expects to be discharged to:: Private residence Living Arrangements: Alone Available Help at Discharge: Available PRN/intermittently;Friend(s) Type of Home: House Home Access: Stairs to enter Entrance Stairs-Rails: Doctor, General Practice of Steps: 12 Alternate Level Stairs-Number of Steps: 12 Home Layout: One level Home Equipment: None      Prior Function Prior Level of Function : Needs assist             Mobility Comments: amb with no AD ADLs Comments: MOD I for ADL, does not drive, significant other assists     Extremity/Trunk Assessment   Upper Extremity Assessment Upper  Extremity Assessment: Defer to OT evaluation    Lower Extremity Assessment Lower Extremity Assessment: Generalized weakness    Cervical / Trunk Assessment Cervical / Trunk Assessment: Normal  Communication   Communication Communication: No apparent difficulties    Cognition Arousal: Alert Behavior During Therapy: WFL for tasks assessed/performed   PT - Cognitive impairments: No apparent impairments                          Following commands: Intact       Cueing Cueing Techniques: Verbal cues     General Comments      Exercises     Assessment/Plan    PT Assessment Patient needs continued PT services  PT Problem List Decreased strength;Decreased activity tolerance;Decreased mobility;Decreased balance;Decreased knowledge of use of DME;Pain       PT Treatment Interventions DME instruction;Gait training;Stair training;Functional mobility training;Therapeutic activities;Therapeutic exercise;Balance training;Neuromuscular re-education;Patient/family education    PT Goals (Current goals can be found in the Care Plan section)  Acute Rehab PT Goals Patient Stated Goal: improve PT Goal Formulation: With patient Time For Goal Achievement: 07/25/24 Potential to Achieve Goals: Good    Frequency Min 2X/week     Co-evaluation               AM-PAC PT 6 Clicks Mobility  Outcome Measure Help needed turning from your back to your side while in a flat bed without using bedrails?: A Little Help needed moving from lying on your back to sitting on the side of a flat bed without using bedrails?: A Little Help needed moving to and from a bed to a chair (including a wheelchair)?: A Lot Help needed standing up from a chair using your arms (e.g., wheelchair or bedside chair)?: A Little Help needed to walk in hospital room?: A Lot Help needed climbing 3-5 steps with a railing? : A Lot 6 Click Score: 15    End of Session   Activity Tolerance: Patient limited by fatigue Patient left: in bed;with call bell/phone within reach;with bed alarm set Nurse Communication: Mobility status PT Visit Diagnosis: Other abnormalities of gait and mobility (R26.89);Unsteadiness on feet (R26.81);Muscle weakness (generalized) (M62.81);Difficulty in walking, not elsewhere classified (R26.2);Pain Pain - part of body:  (headache)    Time: 1010-1025 PT Time Calculation (min) (ACUTE ONLY): 15 min   Charges:   PT  Evaluation $PT Eval Low Complexity: 1 Low   PT General Charges $$ ACUTE PT VISIT: 1 Visit         Daphane Odekirk, PT, GCS 07/11/2024,11:05 AM

## 2024-07-11 NOTE — Progress Notes (Signed)
 Progress Note    Courtney Thomas  FMW:[id number] DOB: 12/11/1945  DOA: 07/10/2024 PCP: Rilla Baller, MD      Brief Narrative:    Medical records reviewed and are as summarized below:  Courtney Thomas is a 78 y.o. female with medical history of hypertension, hypertension, COPD, osteoporosis, mild cognitive impairment, who presented to the hospital because diarrhea with dark tarry stools and general weakness.  She complained of diarrhea for about a week that started after completing antibiotics for an oral infection.  She later noticed that her stools were turning dark.  She had become very weak so she presented to the ED for further evaluation.      Assessment/Plan:   Principal Problem:   Generalized weakness Active Problems:   Hypertension   Hyperlipidemia   Osteoporosis   Osteoarthritis of lumbar spine   Emphysema of lung (HCC)    Body mass index is 19.67 kg/m.   Acute diarrheal illness,?  Melena: Diarrhea appears to have improved.  Stool for C. difficile toxin and GI panel have been ordered but this has not been collected because of improvement in diarrhea.   Hypokalemia: Blood resume down from 3-2.5.  Replete potassium orally and intravenously with potassium chloride .   General weakness: PT and OT recommended discharge to SNF.   Comorbidities include hypertension, hyperlipidemia, COPD, mild cognitive impairment on donepezil    Diet Order             Diet NPO time specified  Diet effective now                                  Consultants: None  Procedures: None    Medications:    buPROPion  ER  100 mg Oral Daily   donepezil  5 mg Oral QHS   DULoxetine   30 mg Oral Daily   lisinopril   10 mg Oral Daily   pantoprazole  (PROTONIX ) IV  40 mg Intravenous Q12H   potassium chloride   40 mEq Oral Once   propranolol   10 mg Oral BID   sodium chloride  flush  3 mL Intravenous Q12H   traZODone   50 mg Oral QHS   Continuous  Infusions:  lactated ringers 100 mL/hr at 07/10/24 1935   potassium chloride  10 mEq (07/11/24 1035)     Anti-infectives (From admission, onward)    None              Family Communication/Anticipated D/C date and plan/Code Status   DVT prophylaxis: Place and maintain sequential compression device Start: 07/11/24 0920 SCDs Start: 07/10/24 1225     Code Status: Full Code  Family Communication: None  Disposition Plan: Plan to discharge to SNF   Status is: Observation The patient will require care spanning > 2 midnights and should be moved to inpatient because: Significant hypokalemia       Subjective:   Interval events noted.  She complains of general weakness and not feeling well.  No diarrhea or vomiting today.  Objective:    Vitals:   07/10/24 2045 07/11/24 0300 07/11/24 0500 07/11/24 0741  BP: (!) 159/70 (!) 165/84  (!) 172/86  Pulse: 87 83  84  Resp: 16 17  18   Temp: 98.5 F (36.9 C) 98.3 F (36.8 C)  98.5 F (36.9 C)  TempSrc:  Oral  Oral  SpO2: 96% 95%  96%  Weight:   42.7 kg   Height:  No data found.   Intake/Output Summary (Last 24 hours) at 07/11/2024 1201 Last data filed at 07/11/2024 0300 Gross per 24 hour  Intake 741.67 ml  Output --  Net 741.67 ml   Filed Weights   07/10/24 0946 07/11/24 0500  Weight: 47.6 kg 42.7 kg    Exam:  GEN: NAD SKIN: Warm and dry.  Poor skin turgor EYES: No pallor or icterus ENT: MMM CV: RRR PULM: CTA B ABD: soft, ND, NT, +BS CNS: AAO x 3, non focal EXT: No edema or tenderness        Data Reviewed:   I have personally reviewed following labs and imaging studies:  Labs: Labs show the following:   Basic Metabolic Panel: Recent Labs  Lab 07/10/24 0948 07/10/24 0949 07/10/24 2052 07/11/24 0511  NA  --  141  --  142  K  --  3.0*  --  2.5*  CL  --  101  --  102  CO2  --  30  --  27  GLUCOSE  --  117*  --  85  BUN  --  9  --  5*  CREATININE  --  0.67  --  0.64  CALCIUM    --  9.4  --  8.8*  MG 1.9  --  1.8  --   PHOS 2.5  --   --   --    GFR Estimated Creatinine Clearance: 38.6 mL/min (by C-G formula based on SCr of 0.64 mg/dL). Liver Function Tests: Recent Labs  Lab 07/10/24 0949  AST 12*  ALT 9  ALKPHOS 78  BILITOT 0.5  PROT 6.5  ALBUMIN 4.0   No results for input(s): LIPASE, AMYLASE in the last 168 hours. No results for input(s): AMMONIA in the last 168 hours. Coagulation profile No results for input(s): INR, PROTIME in the last 168 hours.  CBC: Recent Labs  Lab 07/10/24 0949 07/10/24 2052 07/11/24 0511  WBC 7.4  --  4.8  HGB 13.3 11.7* 12.0  HCT 40.1 34.8* 36.6  MCV 90.5  --  90.6  PLT 264  --  209   Cardiac Enzymes: Recent Labs  Lab 07/10/24 1000  CKTOTAL 47   BNP (last 3 results) No results for input(s): PROBNP in the last 8760 hours. CBG: Recent Labs  Lab 07/11/24 0737  GLUCAP 89   D-Dimer: No results for input(s): DDIMER in the last 72 hours. Hgb A1c: No results for input(s): HGBA1C in the last 72 hours. Lipid Profile: No results for input(s): CHOL, HDL, LDLCALC, TRIG, CHOLHDL, LDLDIRECT in the last 72 hours. Thyroid  function studies: No results for input(s): TSH, T4TOTAL, T3FREE, THYROIDAB in the last 72 hours.  Invalid input(s): FREET3 Anemia work up: No results for input(s): VITAMINB12, FOLATE, FERRITIN, TIBC, IRON , RETICCTPCT in the last 72 hours. Sepsis Labs: Recent Labs  Lab 07/10/24 0949 07/11/24 0511  WBC 7.4 4.8    Microbiology No results found for this or any previous visit (from the past 240 hours).  Procedures and diagnostic studies:  No results found.             LOS: 0 days   [place]  [facility]   Pager on [email or web address]. If 7PM-7AM, please contact night-coverage at [email or web address]     07/11/2024, 12:01 PM

## 2024-07-11 NOTE — NC FL2 (Signed)
 [place]  MEDICAID FL2 LEVEL OF CARE FORM     IDENTIFICATION  Patient Name: Courtney Thomas Birthdate: 06/26/1946 Sex: female Admission Date (Current Location): 07/10/2024  [facility] and [place] Number:  Chiropodist and Address:  [facility], [address] [place], [place] [address]      Provider Number: [id number]  Attending Physician Name and Address:  Jens Durand, MD  Relative Name and Phone Number:       Current Level of Care: Hospital Recommended Level of Care: Skilled Nursing Facility Prior Approval Number:    Date Approved/Denied:   PASRR Number: pending  Discharge Plan: SNF    Current Diagnoses: Patient Active Problem List   Diagnosis Date Noted   Melena 07/10/2024   Generalized weakness 07/10/2024   Syncope 01/11/2024   Mild cognitive impairment 01/11/2024   Gastric ulcer 01/02/2023   Schatzki's ring 06/17/2022   IDA (iron  deficiency anemia) 11/30/2021   Decreased visual acuity 11/30/2021   Thoracic aorta atherosclerosis 03/17/2018   Emphysema of lung (HCC) 03/17/2018   History of allergy to eggs 04/01/2016   Tremor 12/08/2015   Advanced care planning/counseling discussion 03/17/2015   Health care maintenance 03/17/2015   Osteoarthritis of lumbar spine 09/08/2014   Medicare annual wellness visit, subsequent 02/21/2014   Osteoporosis    Vitamin D  deficiency 02/13/2014   Vitamin B12 deficiency    Hypertension    Hyperlipidemia    Insomnia    Seasonal allergic rhinitis    Adjustment disorder with depressed mood     Orientation RESPIRATION BLADDER Height & Weight     Place, Self  Normal Incontinent Weight: 42.7 kg Height:  4' 10 (147.3 cm)  BEHAVIORAL SYMPTOMS/MOOD NEUROLOGICAL BOWEL NUTRITION STATUS      Incontinent Diet (heart healthy)  AMBULATORY STATUS COMMUNICATION OF NEEDS Skin   Limited Assist Verbally Normal                       Personal Care Assistance Level of Assistance               Functional Limitations Info             SPECIAL CARE FACTORS FREQUENCY  PT (By licensed PT), OT (By licensed OT)     PT Frequency: 5x week OT Frequency: 5x week            Contractures Contractures Info: Not present    Additional Factors Info  Code Status, Allergies, Isolation Precautions Code Status Info: full code Allergies Info: penicillins, codeine     Isolation Precautions Info: enteric     Current Medications (07/11/2024):  This is the current hospital active medication list Current Facility-Administered Medications  Medication Dose Route Frequency Provider Last Rate Last Admin   acetaminophen  (TYLENOL ) tablet 650 mg  650 mg Oral Q6H PRN Khan, Ghalib, MD   650 mg at 07/11/24 1501   Or   acetaminophen  (TYLENOL ) suppository 650 mg  650 mg Rectal Q6H PRN Khan, Ghalib, MD       buPROPion  ER (WELLBUTRIN  SR) 12 hr tablet 100 mg  100 mg Oral Daily Ayiku, Bernard, MD   100 mg at 07/11/24 1501   donepezil (ARICEPT) tablet 5 mg  5 mg Oral QHS Jens Durand, MD       DULoxetine  (CYMBALTA ) DR capsule 30 mg  30 mg Oral Daily Ayiku, Bernard, MD   30 mg at 07/11/24 1501   lactated ringers infusion   Intravenous Continuous Fernand Prost, MD  100 mL/hr at 07/10/24 1935 New Bag at 07/10/24 1935   lisinopril  (ZESTRIL ) tablet 10 mg  10 mg Oral Daily Ayiku, Bernard, MD   10 mg at 07/11/24 1501   ondansetron  (ZOFRAN ) tablet 4 mg  4 mg Oral Q6H PRN Fernand Prost, MD       Or   ondansetron  (ZOFRAN ) injection 4 mg  4 mg Intravenous Q6H PRN Khan, Ghalib, MD       oxyCODONE -acetaminophen  (PERCOCET/ROXICET) 5-325 MG per tablet 1 tablet  1 tablet Oral Q6H PRN Jens Durand, MD       pantoprazole  (PROTONIX ) injection 40 mg  40 mg Intravenous Q12H Tan, Ting Xu, MD   40 mg at 07/11/24 9146   potassium chloride  (KLOR-CON ) packet 40 mEq  40 mEq Oral Once Mansy, Jan A, MD       propranolol  (INDERAL ) tablet 10 mg  10 mg Oral BID Jens Durand, MD   10 mg at 07/11/24 1501   senna-docusate  (Senokot-S) tablet 1 tablet  1 tablet Oral QHS PRN Fernand Prost, MD       sodium chloride  flush (NS) 0.9 % injection 3 mL  3 mL Intravenous Q12H Khan, Ghalib, MD   3 mL at 07/11/24 1006   traZODone  (DESYREL ) tablet 50 mg  50 mg Oral QHS Jens Durand, MD       Facility-Administered Medications Ordered in Other Encounters  Medication Dose Route Frequency Provider Last Rate Last Admin   iron  sucrose (VENOFER ) injection 200 mg  200 mg Intravenous Once Gutierrez, Javier, MD         Discharge Medications: Please see discharge summary for a list of discharge medications.  Relevant Imaging Results:  Relevant Lab Results:   Additional Information ss# [id number]  [name], RN

## 2024-07-11 NOTE — Care Management Obs Status (Signed)
 MEDICARE OBSERVATION STATUS NOTIFICATION   Patient Details  Name: Courtney Thomas MRN: [id number] Date of Birth: 1946/04/20   Medicare Observation Status Notification Given:  Yes    Courtney Thomas 07/11/2024, 12:29 PM

## 2024-07-12 LAB — RENAL FUNCTION PANEL
Albumin: 3.9 g/dL (ref 3.5–5.0)
Anion gap: 10 (ref 5–15)
BUN: 8 mg/dL (ref 8–23)
CO2: 28 mmol/L (ref 22–32)
Calcium: 9.5 mg/dL (ref 8.9–10.3)
Chloride: 101 mmol/L (ref 98–111)
Creatinine, Ser: 0.71 mg/dL (ref 0.44–1.00)
GFR, Estimated: >60 mL/min (ref >60)
Glucose, Bld: 88 mg/dL (ref 70–99)
Phosphorus: 3.3 mg/dL (ref 2.5–4.6)
Potassium: 3.1 mmol/L — ABNORMAL LOW (ref 3.5–5.1)
Sodium: 139 mmol/L (ref 135–145)

## 2024-07-12 LAB — GLUCOSE, CAPILLARY: Glucose-Capillary: 86 mg/dL (ref 70–99)

## 2024-07-12 LAB — MAGNESIUM: Magnesium: 2.1 mg/dL (ref 1.7–2.4)

## 2024-07-12 MED ORDER — LISINOPRIL 10 MG PO TABS
10.0000 mg | ORAL_TABLET | Freq: Once | ORAL | Status: AC
  Administered 2024-07-12: 10 mg via ORAL
  Filled 2024-07-12: qty 1

## 2024-07-12 MED ORDER — FLUTICASONE FUROATE-VILANTEROL 100-25 MCG/ACT IN AEPB
1.0000 | INHALATION_SPRAY | Freq: Every day | RESPIRATORY_TRACT | Status: DC
  Administered 2024-07-12 – 2024-07-15 (×4): 1 via RESPIRATORY_TRACT
  Filled 2024-07-12: qty 28

## 2024-07-12 MED ORDER — HYDRALAZINE HCL 25 MG PO TABS: 25.0000 mg | ORAL_TABLET | Freq: Four times a day (QID) | ORAL | Status: DC | PRN

## 2024-07-12 MED ORDER — POTASSIUM CHLORIDE CRYS ER 20 MEQ PO TBCR
40.0000 meq | EXTENDED_RELEASE_TABLET | ORAL | Status: AC
  Administered 2024-07-12 (×2): 40 meq via ORAL
  Filled 2024-07-12 (×2): qty 2

## 2024-07-12 MED ORDER — LISINOPRIL 20 MG PO TABS
20.0000 mg | ORAL_TABLET | Freq: Every day | ORAL | Status: DC
  Administered 2024-07-13 – 2024-07-15 (×3): 20 mg via ORAL
  Filled 2024-07-12 (×3): qty 1

## 2024-07-12 MED ORDER — ATORVASTATIN CALCIUM 10 MG PO TABS
10.0000 mg | ORAL_TABLET | Freq: Every day | ORAL | Status: DC
  Administered 2024-07-12 – 2024-07-15 (×4): 10 mg via ORAL
  Filled 2024-07-12 (×4): qty 1

## 2024-07-12 MED ORDER — PANTOPRAZOLE SODIUM 40 MG PO TBEC
40.0000 mg | DELAYED_RELEASE_TABLET | Freq: Two times a day (BID) | ORAL | Status: DC
  Administered 2024-07-12 – 2024-07-14 (×4): 40 mg via ORAL
  Filled 2024-07-12 (×4): qty 1

## 2024-07-12 MED ORDER — ENSURE PLUS HIGH PROTEIN PO LIQD
237.0000 mL | Freq: Two times a day (BID) | ORAL | Status: DC
  Administered 2024-07-12 – 2024-07-15 (×4): 237 mL via ORAL

## 2024-07-12 NOTE — Progress Notes (Signed)
 PHARMACY CONSULT NOTE - ELECTROLYTES  Pharmacy Consult for Electrolyte Monitoring and Replacement   Recent Labs: Height: 4' 10 (147.3 cm) Weight: 42.6 kg (93 lb 14.7 oz) IBW/kg (Calculated) : 40.9 Estimated Creatinine Clearance: 38.6 mL/min (by C-G formula based on SCr of 0.71 mg/dL). Potassium (mmol/L)  Date Value  07/12/2024 3.1 (L)   Magnesium (mg/dL)  Date Value  87/87/7974 2.1   Calcium  (mg/dL)  Date Value  87/87/7974 9.5   Albumin  Date Value  07/12/2024 3.9 g/dL  89/98/7985 4.4   Phosphorus (mg/dL)  Date Value  87/87/7974 3.3   Sodium (mmol/L)  Date Value  07/12/2024 139   Corrected Ca: 8.8 mg/dL  Assessment  Courtney Thomas is a 78 y.o. female presenting with generalized weakness and dark tarry stools. PMH significant for hypertension, COPD, osteoporosis, and mild cognitive impairment. Pharmacy has been consulted to monitor and replace electrolytes.  Diet: NPO MIVF: LR @ 100 mL/hr Pertinent medications: N/A  Goal of Therapy: Electrolytes WNL  Plan:  K 3.1 - Will order KCL 40 mEq PO x 2 Mag 2.1- No supplementation needed Check BMP, Mg, Phos with AM labs  Thank you for allowing pharmacy to be a part of this patient's care.  Lum VEAR Mania, PharmD, BCPS Clinical Pharmacist 07/12/2024 7:43 AM

## 2024-07-12 NOTE — Plan of Care (Signed)
   Problem: Health Behavior/Discharge Planning: Goal: Ability to manage health-related needs will improve Outcome: Progressing

## 2024-07-12 NOTE — Plan of Care (Signed)
°  Problem: Clinical Measurements: Goal: Will remain free from infection Outcome: Progressing Goal: Diagnostic test results will improve Outcome: Progressing Goal: Respiratory complications will improve Outcome: Progressing   Problem: Nutrition: Goal: Adequate nutrition will be maintained Outcome: Progressing   Problem: Coping: Goal: Level of anxiety will decrease Outcome: Progressing   Problem: Pain Managment: Goal: General experience of comfort will improve and/or be controlled Outcome: Progressing

## 2024-07-12 NOTE — TOC Progression Note (Addendum)
 Transition of Care [facility]) - Progression Note    Patient Details  Name: Courtney Thomas MRN: [id number] Date of Birth: Oct 15, 1945  [facility] of [facility]) CM/SW Contact  Corean ONEIDA Haddock, RN Phone Number: 07/12/2024, 9:08 AM  Clinical Narrative:      Followed up with patient today regarding SNF recs.  She confirms she is agreeable to a bed search.  Bed search initiated.  PASRR pending, clinical uploaded to [place] must  Update:  presented bed offers to patient.  CMS Medicare.gov Compare Post Acute Care list reviewed with patient.  She selects [facility].  Accepted in HUB and notified [place] with [facility] Message sent to MD to determine if patient is appropriate for SNF auth to be initiated    1237. Per MD anticipate Medically readiness tomorrow. Auth initiated in [place] portal.  PASRR recevied [id number]   Expected Discharge Plan: Skilled Nursing Facility Barriers to Discharge: Continued Medical Work up               Expected Discharge Plan and Services In-house Referral: Clinical Social Work Discharge Planning Services: CM Consult                                           Social Drivers of Health (SDOH) Interventions SDOH Screenings   Food Insecurity: No Food Insecurity (07/11/2024)  Housing: Unknown (07/11/2024)  Transportation Needs: Patient Unable To Answer (07/11/2024)  Utilities: Patient Unable To Answer (07/11/2024)  Alcohol Screen: Low Risk (08/30/2022)  Depression (PHQ2-9): Medium Risk (05/01/2024)  Financial Resource Strain: Low Risk  (02/20/2024)   Received from [facility]  Physical Activity: Inactive (08/30/2022)  Social Connections: Patient Unable To Answer (07/11/2024)  Stress: No Stress Concern Present (08/30/2022)  Tobacco Use: Medium Risk (07/10/2024)    Readmission Risk Interventions     No data to display

## 2024-07-12 NOTE — Progress Notes (Addendum)
 Progress Note    Courtney Thomas  FMW:[id number] DOB: [birth year]  DOA: 07/10/2024 PCP: Rilla Baller, MD      Brief Narrative:    Medical records reviewed and are as summarized below:  Courtney Thomas is a 78 y.o. female with medical history of hypertension, hypertension, COPD, osteoporosis, mild cognitive impairment, who presented to the hospital because diarrhea with dark tarry stools and general weakness.  She complained of diarrhea for about a week that started after completing antibiotics for an oral infection.  She later noticed that her stools were turning dark.  She had become very weak so she presented to the ED for further evaluation.      Assessment/Plan:   Principal Problem:   Generalized weakness Active Problems:   Hypertension   Hyperlipidemia   Osteoporosis   Osteoarthritis of lumbar spine   Emphysema of lung (HCC)    Body mass index is 19.63 kg/m.   Acute diarrheal illness,?  Melena: Resolved.  Stool for C. difficile toxin and GI panel have been ordered but this has not been collected because of improvement in diarrhea.   Hypokalemia: Improving.  Potassium up from 2.5-3.1.  Continue oral potassium repletion.     Hypertensive urgency: BP up to 192/68.  Increase lisinopril  from 10 mg to 20 mg daily.  Add oral hydralazine as needed for severe hypertension.   General weakness: PT and OT recommended discharge to SNF.   Comorbidities include hypertension, hyperlipidemia, COPD, mild cognitive impairment on donepezil    Diet Order             Diet Heart Fluid consistency: Thin  Diet effective now                                  Consultants: None  Procedures: None    Medications:    buPROPion  ER  100 mg Oral Daily   donepezil  5 mg Oral QHS   DULoxetine   30 mg Oral Daily   lisinopril   10 mg Oral Once   [START ON 07/13/2024] lisinopril   20 mg Oral Daily   pantoprazole   40 mg Oral BID   potassium chloride    40 mEq Oral Q4H   propranolol   10 mg Oral BID   sodium chloride  flush  3 mL Intravenous Q12H   traZODone   50 mg Oral QHS   Continuous Infusions:     Anti-infectives (From admission, onward)    None              Family Communication/Anticipated D/C date and plan/Code Status   DVT prophylaxis: Place and maintain sequential compression device Start: 07/11/24 0920 SCDs Start: 07/10/24 1225     Code Status: Full Code  Family Communication: Plan discussed with Lucie, daughter, over the phone Disposition Plan: Plan to discharge to SNF   Status is: Observation The patient will require care spanning > 2 midnights and should be moved to inpatient because: Significant hypokalemia       Subjective:   Interval events noted.  She complains of general weakness.  No diarrhea, vomiting or abdominal pain.  Objective:    Vitals:   07/11/24 1944 07/12/24 0500 07/12/24 0502 07/12/24 0727  BP: (!) 156/82  (!) 191/75 (!) 192/68  Pulse: 72  69 67  Resp: 16  16 17   Temp: 98.7 F (37.1 C)  97.8 F (36.6 C) 97.7 F (36.5 C)  TempSrc:  SpO2: 95%  97% 96%  Weight:  42.6 kg    Height:       No data found.   Intake/Output Summary (Last 24 hours) at 07/12/2024 1142 Last data filed at 07/12/2024 9666 Gross per 24 hour  Intake 360 ml  Output --  Net 360 ml   Filed Weights   07/10/24 0946 07/11/24 0500 07/12/24 0500  Weight: 47.6 kg 42.7 kg 42.6 kg    Exam:  GEN: NAD SKIN: Warm and dry EYES: No pallor or icterus ENT: MMM CV: RRR PULM: CTA B ABD: soft, ND, NT, +BS CNS: AAO x 2 (person and place), non focal EXT: No edema or tenderness       Data Reviewed:   I have personally reviewed following labs and imaging studies:  Labs: Labs show the following:   Basic Metabolic Panel: Recent Labs  Lab 07/10/24 0948 07/10/24 0949 07/10/24 0949 07/10/24 2052 07/11/24 0511 07/12/24 0500  NA  --  141  --   --  142 139  K  --  3.0*   < >  --  2.5*  3.1*  CL  --  101  --   --  102 101  CO2  --  30  --   --  27 28  GLUCOSE  --  117*  --   --  85 88  BUN  --  9  --   --  5* 8  CREATININE  --  0.67  --   --  0.64 0.71  CALCIUM   --  9.4  --   --  8.8* 9.5  MG 1.9  --   --  1.8  --  2.1  PHOS 2.5  --   --   --   --  3.3   < > = values in this interval not displayed.   GFR Estimated Creatinine Clearance: 38.6 mL/min (by C-G formula based on SCr of 0.71 mg/dL). Liver Function Tests: Recent Labs  Lab 07/10/24 0949 07/12/24 0500  AST 12*  --   ALT 9  --   ALKPHOS 78  --   BILITOT 0.5  --   PROT 6.5  --   ALBUMIN 4.0 3.9   No results for input(s): LIPASE, AMYLASE in the last 168 hours. No results for input(s): AMMONIA in the last 168 hours. Coagulation profile No results for input(s): INR, PROTIME in the last 168 hours.  CBC: Recent Labs  Lab 07/10/24 0949 07/10/24 2052 07/11/24 0511 07/11/24 1813  WBC 7.4  --  4.8  --   HGB 13.3 11.7* 12.0 12.1  HCT 40.1 34.8* 36.6 37.4  MCV 90.5  --  90.6  --   PLT 264  --  209  --    Cardiac Enzymes: Recent Labs  Lab 07/10/24 1000  CKTOTAL 47   BNP (last 3 results) No results for input(s): PROBNP in the last 8760 hours. CBG: Recent Labs  Lab 07/11/24 0737 07/12/24 0729  GLUCAP 89 86   D-Dimer: No results for input(s): DDIMER in the last 72 hours. Hgb A1c: No results for input(s): HGBA1C in the last 72 hours. Lipid Profile: No results for input(s): CHOL, HDL, LDLCALC, TRIG, CHOLHDL, LDLDIRECT in the last 72 hours. Thyroid  function studies: No results for input(s): TSH, T4TOTAL, T3FREE, THYROIDAB in the last 72 hours.  Invalid input(s): FREET3 Anemia work up: No results for input(s): VITAMINB12, FOLATE, FERRITIN, TIBC, IRON , RETICCTPCT in the last 72 hours. Sepsis Labs: Recent Labs  Lab 07/10/24  9050 07/11/24 0511  WBC 7.4 4.8    Microbiology No results found for this or any previous visit (from the past 240  hours).  Procedures and diagnostic studies:  No results found.             LOS: 0 days   Deyonna Fitzsimmons  [facility]   Pager on [email or web address]. If 7PM-7AM, please contact night-coverage at [email or web address]     07/12/2024, 11:42 AM

## 2024-07-12 NOTE — Progress Notes (Signed)
 PHARMACIST - PHYSICIAN COMMUNICATION  DR:   Jens  CONCERNING: IV to Oral Route Change Policy  RECOMMENDATION: This patient is receiving pantoprazole  by the intravenous route.  Based on criteria approved by the Pharmacy and Therapeutics Committee, the intravenous medication(s) is/are being converted to the equivalent oral dose form(s).   DESCRIPTION: These criteria include: The patient is eating (either orally or via tube) and/or has been taking other orally administered medications for a least 24 hours The patient has no evidence of active gastrointestinal bleeding or impaired GI absorption (gastrectomy, short bowel, patient on TNA or NPO).  If you have questions about this conversion, please contact the Pharmacy Department  []   [phone] )  Courtney Thomas [x]   [phone] )  [facility] []   [phone] )  Courtney Thomas []   [phone] )  [facility] []   [phone] )  [facility], [facility] 07/12/2024 10:11 AM

## 2024-07-12 NOTE — Progress Notes (Signed)
 Per Greig, RN, MD was notified that the pt refused tele.

## 2024-07-13 LAB — BASIC METABOLIC PANEL WITH GFR
Anion gap: 8 (ref 5–15)
BUN: 10 mg/dL (ref 8–23)
CO2: 28 mmol/L (ref 22–32)
Calcium: 9.6 mg/dL (ref 8.9–10.3)
Chloride: 102 mmol/L (ref 98–111)
Creatinine, Ser: 0.71 mg/dL (ref 0.44–1.00)
GFR, Estimated: >60 mL/min (ref >60)
Glucose, Bld: 96 mg/dL (ref 70–99)
Potassium: 3.5 mmol/L (ref 3.5–5.1)
Sodium: 138 mmol/L (ref 135–145)

## 2024-07-13 LAB — GLUCOSE, CAPILLARY: Glucose-Capillary: 99 mg/dL (ref 70–99)

## 2024-07-13 MED ORDER — POTASSIUM CHLORIDE CRYS ER 20 MEQ PO TBCR
40.0000 meq | EXTENDED_RELEASE_TABLET | Freq: Every day | ORAL | Status: AC
  Administered 2024-07-13 – 2024-07-14 (×2): 40 meq via ORAL
  Filled 2024-07-13 (×2): qty 2

## 2024-07-13 NOTE — Progress Notes (Signed)
 Progress Note    Courtney Thomas  FMW:[id number] DOB: 10/12/1945  DOA: 07/10/2024 PCP: Rilla Baller, MD      Brief Narrative:    Medical records reviewed and are as summarized below:  Courtney Thomas is a 78 y.o. female with medical history of hypertension, hypertension, COPD, osteoporosis, mild cognitive impairment, who presented to the hospital because diarrhea with dark tarry stools and general weakness.  She complained of diarrhea for about a week that started after completing antibiotics for an oral infection.  She later noticed that her stools were turning dark.  She had become very weak so she presented to the ED for further evaluation.      Assessment/Plan:   Principal Problem:   Generalized weakness Active Problems:   Hypertension   Hyperlipidemia   Osteoporosis   Osteoarthritis of lumbar spine   Emphysema of lung (HCC)    Body mass index is 19.9 kg/m.   Acute diarrheal illness,?  Melena: Resolved.     Hypokalemia: Improved.  Continue oral potassium repletion.   Hypertensive urgency: BP is better.  Continue lisinopril  and propranolol .    General weakness: PT and OT recommended discharge to SNF.   Comorbidities include hypertension, hyperlipidemia, COPD, mild cognitive impairment on donepezil     Diet Order             Diet Heart Fluid consistency: Thin  Diet effective now                                  Consultants: None  Procedures: None    Medications:    atorvastatin   10 mg Oral Daily   buPROPion  ER  100 mg Oral Daily   donepezil   5 mg Oral QHS   DULoxetine   30 mg Oral Daily   feeding supplement  237 mL Oral BID BM   fluticasone  furoate-vilanterol  1 puff Inhalation Daily   lisinopril   20 mg Oral Daily   pantoprazole   40 mg Oral BID   potassium chloride   40 mEq Oral Daily   propranolol   10 mg Oral BID   sodium chloride  flush  3 mL Intravenous Q12H   traZODone   50 mg Oral QHS   Continuous  Infusions:     Anti-infectives (From admission, onward)    None              Family Communication/Anticipated D/C date and plan/Code Status   DVT prophylaxis: Place and maintain sequential compression device Start: 07/11/24 0920 SCDs Start: 07/10/24 1225     Code Status: Full Code  Family Communication: Plan discussed with her significant other at the bedside. Disposition Plan: Plan to discharge to SNF   Status is: Observation The patient will require care spanning > 2 midnights and should be moved to inpatient because: Significant hypokalemia       Subjective:   No complaints.  No diarrhea, vomiting or abdominal pain.  She feels better.  Significant other at the bedside.  Objective:    Vitals:   07/13/24 0511 07/13/24 0732 07/13/24 0955 07/13/24 1009  BP:  (!) 180/68 (!) 164/65 (!) 164/65  Pulse:  69 71 71  Resp:  17    Temp:  98.2 F (36.8 C)    TempSrc:      SpO2:  97%    Weight: 43.2 kg     Height:       No data found.  Intake/Output Summary (Last 24 hours) at 07/13/2024 1251 Last data filed at 07/13/2024 1009 Gross per 24 hour  Intake 103 ml  Output 300 ml  Net -197 ml   Filed Weights   07/11/24 0500 07/12/24 0500 07/13/24 0511  Weight: 42.7 kg 42.6 kg 43.2 kg    Exam:  GEN: NAD SKIN: Warm and dry EYES: No pallor or icterus ENT: MMM CV: RRR PULM: CTA B ABD: soft, ND, NT, +BS CNS: AAO x 1 (person), non focal EXT: No edema or tenderness        Data Reviewed:   I have personally reviewed following labs and imaging studies:  Labs: Labs show the following:   Basic Metabolic Panel: Recent Labs  Lab 07/10/24 0948 07/10/24 0949 07/10/24 0949 07/10/24 2052 07/11/24 0511 07/12/24 0500 07/13/24 0333  NA  --  141  --   --  142 139 138  K  --  3.0*   < >  --  2.5* 3.1* 3.5  CL  --  101  --   --  102 101 102  CO2  --  30  --   --  27 28 28   GLUCOSE  --  117*  --   --  85 88 96  BUN  --  9  --   --  5* 8 10   CREATININE  --  0.67  --   --  0.64 0.71 0.71  CALCIUM   --  9.4  --   --  8.8* 9.5 9.6  MG 1.9  --   --  1.8  --  2.1  --   PHOS 2.5  --   --   --   --  3.3  --    < > = values in this interval not displayed.   GFR Estimated Creatinine Clearance: 38.6 mL/min (by C-G formula based on SCr of 0.71 mg/dL). Liver Function Tests: Recent Labs  Lab 07/10/24 0949 07/12/24 0500  AST 12*  --   ALT 9  --   ALKPHOS 78  --   BILITOT 0.5  --   PROT 6.5  --   ALBUMIN 4.0 3.9   No results for input(s): LIPASE, AMYLASE in the last 168 hours. No results for input(s): AMMONIA in the last 168 hours. Coagulation profile No results for input(s): INR, PROTIME in the last 168 hours.  CBC: Recent Labs  Lab 07/10/24 0949 07/10/24 2052 07/11/24 0511 07/11/24 1813  WBC 7.4  --  4.8  --   HGB 13.3 11.7* 12.0 12.1  HCT 40.1 34.8* 36.6 37.4  MCV 90.5  --  90.6  --   PLT 264  --  209  --    Cardiac Enzymes: Recent Labs  Lab 07/10/24 1000  CKTOTAL 47   BNP (last 3 results) No results for input(s): PROBNP in the last 8760 hours. CBG: Recent Labs  Lab 07/11/24 0737 07/12/24 0729 07/13/24 0734  GLUCAP 89 86 99   D-Dimer: No results for input(s): DDIMER in the last 72 hours. Hgb A1c: No results for input(s): HGBA1C in the last 72 hours. Lipid Profile: No results for input(s): CHOL, HDL, LDLCALC, TRIG, CHOLHDL, LDLDIRECT in the last 72 hours. Thyroid  function studies: No results for input(s): TSH, T4TOTAL, T3FREE, THYROIDAB in the last 72 hours.  Invalid input(s): FREET3 Anemia work up: No results for input(s): VITAMINB12, FOLATE, FERRITIN, TIBC, IRON , RETICCTPCT in the last 72 hours. Sepsis Labs: Recent Labs  Lab 07/10/24 0949 07/11/24 0511  WBC 7.4  4.8    Microbiology No results found for this or any previous visit (from the past 240 hours).  Procedures and diagnostic studies:  No results found.             LOS:  0 days   Avyn Aden  [facility]   Pager on [email or web address]. If 7PM-7AM, please contact night-coverage at [email or web address]     07/13/2024, 12:51 PM

## 2024-07-13 NOTE — Progress Notes (Signed)
 Physical Therapy Treatment Patient Details Name: Courtney Thomas MRN: [id number] DOB: 1945/09/01 Today's Date: 07/13/2024   History of Present Illness Pt is a 78 year old female admitted with generalized wakness secondary to diarrhea    PMH significant for hypertension, hypertension, COPD, osteoporosis, mild cognitive impairment    PT Comments  Pt progressed with ambulating with RW 5' Min A with recliner following.  Pt demonstrates having difficulty initiating and sequencing basic mobility requiring cues.  Pt continues to experience limitations to mobility, demonstrating decreased activity tolerance, general weakness, and poor postural awareness affecting sitting and standing balance. Continue to recommend post acute rehab <3 hours therapy/day upon d/c.      If plan is discharge home, recommend the following: A lot of help with walking and/or transfers;A lot of help with bathing/dressing/bathroom   Can travel by private vehicle     Yes  Equipment Recommendations  Rolling walker (2 wheels)    Recommendations for Other Services       Precautions / Restrictions Precautions Precautions: Fall Recall of Precautions/Restrictions: Impaired Restrictions Weight Bearing Restrictions Per Provider Order: No     Mobility  Bed Mobility Overal bed mobility: Needs Assistance Bed Mobility: Supine to Sit     Supine to sit: Min assist     General bed mobility comments: increased time for sequencing and effort needed    Transfers Overall transfer level: Needs assistance Equipment used: Rolling walker (2 wheels) Transfers: Sit to/from Stand, Bed to chair/wheelchair/BSC Sit to Stand: Min assist   Step pivot transfers: Min assist       General transfer comment: posterior lean    Ambulation/Gait Ambulation/Gait assistance: Min assist Gait Distance (Feet): 5 Feet Assistive device: Rolling walker (2 wheels) Gait Pattern/deviations: Shuffle Gait velocity: decr     General Gait  Details: Recliner following, increase time, apraxic movement at times.   Stairs             Wheelchair Mobility     Tilt Bed    Modified Rankin (Stroke Patients Only)       Balance Overall balance assessment: Needs assistance Sitting-balance support: Feet supported Sitting balance-Leahy Scale: Poor Sitting balance - Comments: poor postural awareness, requiring cues for coming back into midline. Postural control: Posterior lean Standing balance support: Bilateral upper extremity supported, During functional activity, Reliant on assistive device for balance Standing balance-Leahy Scale: Poor                              Communication Communication Communication: No apparent difficulties  Cognition Arousal: Alert Behavior During Therapy: WFL for tasks assessed/performed   PT - Cognitive impairments: Problem solving, Sequencing, Awareness                       PT - Cognition Comments: Pt demonstrated difficulty with sequencing basic mobility requiring more time and demonstrated poor postural awareness in sitting and standing requiring cues for to come back to midline. Following commands: Impaired Following commands impaired: Follows one step commands inconsistently, Follows one step commands with increased time    Cueing Cueing Techniques: Verbal cues, Tactile cues  Exercises      General Comments        Pertinent Vitals/Pain Pain Assessment Pain Assessment: No/denies pain    Home Living                          Prior Function  PT Goals (current goals can now be found in the care plan section) Acute Rehab PT Goals Patient Stated Goal: improve PT Goal Formulation: With patient Time For Goal Achievement: 07/25/24 Potential to Achieve Goals: Good Progress towards PT goals: Progressing toward goals    Frequency    Min 2X/week      PT Plan      Co-evaluation              AM-PAC PT 6 Clicks  Mobility   Outcome Measure  Help needed turning from your back to your side while in a flat bed without using bedrails?: A Little Help needed moving from lying on your back to sitting on the side of a flat bed without using bedrails?: A Little Help needed moving to and from a bed to a chair (including a wheelchair)?: A Little Help needed standing up from a chair using your arms (e.g., wheelchair or bedside chair)?: A Little Help needed to walk in hospital room?: A Lot Help needed climbing 3-5 steps with a railing? : A Lot 6 Click Score: 16    End of Session Equipment Utilized During Treatment: Gait belt Activity Tolerance: Patient limited by fatigue Patient left: in chair;with call bell/phone within reach;with chair alarm set Nurse Communication: Mobility status PT Visit Diagnosis: Other abnormalities of gait and mobility (R26.89);Unsteadiness on feet (R26.81);Muscle weakness (generalized) (M62.81);Difficulty in walking, not elsewhere classified (R26.2);Pain     Time: 1415-1436 PT Time Calculation (min) (ACUTE ONLY): 21 min  Charges:    $Therapeutic Activity: 8-22 mins PT General Charges $$ ACUTE PT VISIT: 1 Visit                     Harland Irving, PTA  07/13/2024, 2:54 PM

## 2024-07-13 NOTE — Plan of Care (Signed)

## 2024-07-13 NOTE — Progress Notes (Signed)
 Mobility Specialist - Progress Note   07/13/24 1316  Mobility  Activity Stood at bedside;Dangled on edge of bed;Moved bed into chair position  Level of Assistance Standby assist, set-up cues, supervision of patient - no hands on  Assistive Device None  Distance Ambulated (ft) 3 ft  Range of Motion/Exercises All extremities  Activity Response Tolerated well  Mobility visit 1 Mobility  Mobility Specialist Start Time (ACUTE ONLY) 1216  Mobility Specialist Stop Time (ACUTE ONLY) 1226  Mobility Specialist Time Calculation (min) (ACUTE ONLY) 10 min   Pt was horizontal supine in bed on RA upon entry. Pt agreed to mobility. Pt needed continued VC and minA to get to the EOB. Pt is able today to STS with modA. Pt is able to stand and took a step but went right back in bed stating she didn't want to walk now. Pt returned back to bed with needs in reach. Guest entered the room upon exit.  Clem Rodes Mobility Specialist 07/13/2024, 1:20 PM

## 2024-07-13 NOTE — Progress Notes (Signed)
 PHARMACY CONSULT NOTE - ELECTROLYTES  Pharmacy Consult for Electrolyte Monitoring and Replacement   Recent Labs: Height: 4' 10 (147.3 cm) Weight: 43.2 kg (95 lb 3.8 oz) IBW/kg (Calculated) : 40.9 Estimated Creatinine Clearance: 38.6 mL/min (by C-G formula based on SCr of 0.71 mg/dL). Potassium (mmol/L)  Date Value  07/13/2024 3.5   Magnesium  (mg/dL)  Date Value  87/87/7974 2.1   Calcium  (mg/dL)  Date Value  87/86/7974 9.6   Albumin  Date Value  07/12/2024 3.9 g/dL  89/98/7985 4.4   Phosphorus (mg/dL)  Date Value  87/87/7974 3.3   Sodium (mmol/L)  Date Value  07/13/2024 138   Assessment  Courtney Thomas is a 78 y.o. female presenting with generalized weakness and dark tarry stools. PMH significant for hypertension, COPD, osteoporosis, and mild cognitive impairment. Pharmacy has been consulted to monitor and replace electrolytes.  Diet: NPO >> heart MIVF: N/A Pertinent medications: N/A  Goal of Therapy: Electrolytes WNL  Plan:  K 3.5, Kcl 40 mEq PO daily x 2 days Sign off on consult  Thank you for allowing pharmacy to be a part of this patient's care.  Delmont Prosch B Tamecka Milham 07/13/2024 6:54 AM

## 2024-07-14 DIAGNOSIS — M47816 Spondylosis without myelopathy or radiculopathy, lumbar region: Secondary | ICD-10-CM | POA: Diagnosis not present

## 2024-07-14 DIAGNOSIS — E785 Hyperlipidemia, unspecified: Secondary | ICD-10-CM | POA: Diagnosis present

## 2024-07-14 MED ORDER — ORAL CARE MOUTH RINSE: 15.0000 mL | OROMUCOSAL | Status: DC | PRN

## 2024-07-14 MED ORDER — PANTOPRAZOLE SODIUM 40 MG PO TBEC
40.0000 mg | DELAYED_RELEASE_TABLET | Freq: Every day | ORAL | Status: DC
  Administered 2024-07-15: 09:00:00 40 mg via ORAL
  Filled 2024-07-14: qty 1

## 2024-07-14 NOTE — Plan of Care (Signed)

## 2024-07-14 NOTE — Progress Notes (Signed)
 PROGRESS NOTE    Courtney Thomas  FMW:[id number] DOB: 1945-12-05 DOA: 07/10/2024 PCP: Rilla Baller, MD  Chief Complaint  Patient presents with   Weakness    Hospital Course:  Courtney Thomas is a 78 year old with hypertension, COPD, osteoporosis, mild cognitive impairment, presented to the hospital with diarrhea, dark tarry stools, and generalized weakness.  Diarrhea began after completing antibiotics for an oral infection.  It has since resolved.  Her weakness persist and PT/OT are recommending discharge to SNF.  Subjective: This morning patient is resting in bed, her boyfriend is at bedside.  She denies any acute complaints.  Denies any issues with diarrhea over the last 24 hours   Objective: Vitals:   07/13/24 1556 07/13/24 2111 07/14/24 0500 07/14/24 0503  BP: (!) 163/50 (!) 152/62 (!) 139/58 (!) 145/55  Pulse: 66 (!) 57 69 68  Resp: (!) 21 18 17 17   Temp: 97.7 F (36.5 C) 97.7 F (36.5 C) 97.9 F (36.6 C) 97.9 F (36.6 C)  TempSrc:  Oral    SpO2: 96% 94% 94% 93%  Weight:    40.1 kg  Height:        Intake/Output Summary (Last 24 hours) at 07/14/2024 1233 Last data filed at 07/13/2024 1418 Gross per 24 hour  Intake 240 ml  Output --  Net 240 ml   Filed Weights   07/12/24 0500 07/13/24 0511 07/14/24 0503  Weight: 42.6 kg 43.2 kg 40.1 kg    Examination: General exam: Appears calm and comfortable, NAD, thin Respiratory system: No work of breathing, symmetric chest wall expansion Cardiovascular system: S1 & S2 heard, RRR.  Gastrointestinal system: Abdomen is nondistended, soft and nontender.  Neuro: Alert and oriented  Assessment & Plan:  Principal Problem:   Generalized weakness Active Problems:   Hypertension   Hyperlipidemia   Osteoporosis   Osteoarthritis of lumbar spine   Emphysema of lung (HCC)    Generalized weakness - Likely secondary to diarrhea - PT/OT recommending DC to SNF, TOC aware and working on it  Diarrhea - Resolved - Appears  to have been secondary to recent antibiotic course.  C. difficile testing not performed on arrival.  No further diarrhea now  Melena - Hemoglobin remains stable.  No further diarrhea or melena per patient - Daily PPI - Referral to GI at DC for colonoscopy if desired  Hypokalemia - Resolved.  Hypertensive urgency - Resolved - Blood pressure better controlled.  Continue current meds, titrate as needed  Hyperlipidemia - Continue meds  COPD, not currently in exacerbation - On room air - Continue home dose inhalers  Mild cognitive impairment - Continue donepezil  - Delirium precautions  Osteoporosis - Receives Prolia  outpatient   DVT prophylaxis: SCDs   Code Status: Full Code Disposition: Medically ready. Needs SNF  Consultants:    Procedures:    Antimicrobials:  Anti-infectives (From admission, onward)    None       Data Reviewed: I have personally reviewed following labs and imaging studies CBC: Recent Labs  Lab 07/10/24 0949 07/10/24 2052 07/11/24 0511 07/11/24 1813  WBC 7.4  --  4.8  --   HGB 13.3 11.7* 12.0 12.1  HCT 40.1 34.8* 36.6 37.4  MCV 90.5  --  90.6  --   PLT 264  --  209  --    Basic Metabolic Panel: Recent Labs  Lab 07/10/24 0948 07/10/24 0949 07/10/24 2052 07/11/24 0511 07/12/24 0500 07/13/24 0333  NA  --  141  --  142 139 138  K  --  3.0*  --  2.5* 3.1* 3.5  CL  --  101  --  102 101 102  CO2  --  30  --  27 28 28   GLUCOSE  --  117*  --  85 88 96  BUN  --  9  --  5* 8 10  CREATININE  --  0.67  --  0.64 0.71 0.71  CALCIUM   --  9.4  --  8.8* 9.5 9.6  MG 1.9  --  1.8  --  2.1  --   PHOS 2.5  --   --   --  3.3  --    GFR: Estimated Creatinine Clearance: 37.9 mL/min (by C-G formula based on SCr of 0.71 mg/dL). Liver Function Tests: Recent Labs  Lab 07/10/24 0949 07/12/24 0500  AST 12*  --   ALT 9  --   ALKPHOS 78  --   BILITOT 0.5  --   PROT 6.5  --   ALBUMIN 4.0 3.9   CBG: Recent Labs  Lab 07/11/24 0737  07/12/24 0729 07/13/24 0734  GLUCAP 89 86 99    No results found for this or any previous visit (from the past 240 hours).   Radiology Studies: No results found.  Scheduled Meds:  atorvastatin   10 mg Oral Daily   buPROPion  ER  100 mg Oral Daily   donepezil   5 mg Oral QHS   DULoxetine   30 mg Oral Daily   feeding supplement  237 mL Oral BID BM   fluticasone  furoate-vilanterol  1 puff Inhalation Daily   lisinopril   20 mg Oral Daily   pantoprazole   40 mg Oral BID   propranolol   10 mg Oral BID   sodium chloride  flush  3 mL Intravenous Q12H   traZODone   50 mg Oral QHS   Continuous Infusions:   LOS: 0 days  MDM: Patient is high risk for one or more organ failure.  They necessitate ongoing hospitalization for continued IV therapies and subsequent lab monitoring. Total time spent interpreting labs and vitals, reviewing the medical record, coordinating care amongst consultants and care team members, directly assessing and discussing care with the patient and/or family: 55 min  Nayef College, DO [facility]  To contact the attending physician between 7A-7P please use Epic Chat. To contact the covering physician during after hours 7P-7A, please review Amion.  07/14/2024, 12:33 PM   *This document has been created with the assistance of dictation software. Please excuse typographical errors. *

## 2024-07-15 ENCOUNTER — Observation Stay

## 2024-07-15 DIAGNOSIS — R531 Weakness: Secondary | ICD-10-CM | POA: Diagnosis not present

## 2024-07-15 LAB — GLUCOSE, CAPILLARY
Glucose-Capillary: 94 mg/dL (ref 70–99)
Glucose-Capillary: 93 mg/dL (ref 70–99)

## 2024-07-15 MED ORDER — LISINOPRIL 20 MG PO TABS: 20.0000 mg | ORAL_TABLET | Freq: Every day | ORAL | Status: AC

## 2024-07-15 MED ORDER — PANTOPRAZOLE SODIUM 40 MG PO TBEC: 40.0000 mg | DELAYED_RELEASE_TABLET | Freq: Every day | ORAL | Status: AC

## 2024-07-15 NOTE — Progress Notes (Signed)
 Mobility Specialist - Progress Note   07/15/24 1248  Mobility  Activity Mechanically lifted to/from [facility] (safety assisted from floor to bed)  Level of Assistance Moderate assist, patient does 50-74%  Assistive Device  (+2)  Distance Ambulated (ft) 2 ft  Activity Response Tolerated well  Mobility visit 1 Mobility  Mobility Specialist Start Time (ACUTE ONLY) 1227  Mobility Specialist Stop Time (ACUTE ONLY) 1239  Mobility Specialist Time Calculation (min) (ACUTE ONLY) 12 min   Pt lying on the floor upon arrival, utilizing RA. Pt expressed that she was transferred to the [facility] and tipped over while seated. MS alerts charge nurse and RN. Pt transferred to bed requiring ModA +2 for safety--- denies pain. Pt left supine with alarm set and needs within reach. RN present at bedside.  America Silvan Mobility Specialist 07/15/2024 12:59 PM

## 2024-07-15 NOTE — Progress Notes (Signed)
 Called report to [facility] where patient is transferring. All questions and concerns answered .

## 2024-07-15 NOTE — Progress Notes (Signed)
 Called Marcey, significant other, to come and pick patient up. No answer, left message.

## 2024-07-15 NOTE — TOC Transition Note (Signed)
 Transition of Care [facility]) - Discharge Note   Patient Details  Name: Courtney Thomas MRN: [id number] Date of Birth: Jun 17, 1946  Transition of Care [facility]) CM/SW Contact:  Corean ONEIDA Haddock, RN Phone Number: 07/15/2024, 11:26 AM   Clinical Narrative:     Patient will DC [facility]  Anticipated DC date: 07/15/2024  Family notified: Significant other Elspeth Transport ab:[id number] other Elspeth  Per MD patient ready for DC to . RN, patient, , and facility notified of DC. Discharge Summary sent to facility. RN given number for report. DC packet on chart. TOC signing off.     Barriers to Discharge: Continued Medical Work up   Patient Goals and CMS Choice            Discharge Placement                       Discharge Plan and Services Additional resources added to the After Visit Summary for   In-house Referral: Clinical Social Work Discharge Planning Services: CM Consult                                 Social Drivers of Health (SDOH) Interventions SDOH Screenings   Food Insecurity: No Food Insecurity (07/11/2024)  Housing: Unknown (07/11/2024)  Transportation Needs: Patient Unable To Answer (07/11/2024)  Utilities: Patient Unable To Answer (07/11/2024)  Alcohol Screen: Low Risk (08/30/2022)  Depression (PHQ2-9): Medium Risk (05/01/2024)  Financial Resource Strain: Low Risk  (02/20/2024)   Received from [facility]  Physical Activity: Inactive (08/30/2022)  Social Connections: Patient Unable To Answer (07/11/2024)  Stress: No Stress Concern Present (08/30/2022)  Tobacco Use: Medium Risk (07/10/2024)     Readmission Risk Interventions     No data to display

## 2024-07-15 NOTE — Progress Notes (Signed)
 Occupational Therapy Treatment Patient Details Name: Courtney Thomas MRN: [id number] DOB: 1945/08/31 Today's Date: 07/15/2024   History of present illness Pt is a 78 year old female admitted with generalized wakness secondary to diarrhea    PMH significant for hypertension, hypertension, COPD, osteoporosis, mild cognitive impairment   OT comments  Chart reviewed to date, pt greeted semi supine in bed oriented to self and place. She requires increased time for one step directions with multi modal cues throughout all mobility and ADL tasks. Tx session targeted improving functional activity tolerance in prep for ADL tasks. Bed noted to be wet, pt requires MIN A for SPT to bsc, MOD A-MAX A for bathing, MIN A for UB dressing. Upon return to bed, pt with emesis. Nurse in room to assess. Pt left in care of nurse, OT will continue to follow.       If plan is discharge home, recommend the following:  A little help with walking and/or transfers;A little help with bathing/dressing/bathroom;Help with stairs or ramp for entrance;Assistance with cooking/housework   Equipment Recommendations  Other (comment) (defer to next venue of care)    Recommendations for Other Services      Precautions / Restrictions Precautions Precautions: Fall Recall of Precautions/Restrictions: Impaired Restrictions Weight Bearing Restrictions Per Provider Order: No       Mobility Bed Mobility Overal bed mobility: Needs Assistance Bed Mobility: Sit to Supine, Supine to Sit     Supine to sit: Min assist, HOB elevated Sit to supine: Min assist        Transfers Overall transfer level: Needs assistance Equipment used: 1 person hand held assist Transfers: Sit to/from Stand Sit to Stand: Min assist                 Balance Overall balance assessment: Needs assistance Sitting-balance support: Feet supported Sitting balance-Leahy Scale: Fair     Standing balance support: Bilateral upper extremity  supported, During functional activity, Reliant on assistive device for balance Standing balance-Leahy Scale: Poor                             ADL either performed or assessed with clinical judgement   ADL Overall ADL's : Needs assistance/impaired         Upper Body Bathing: Moderate assistance;Cueing for sequencing   Lower Body Bathing: Maximal assistance;Sit to/from stand;Cueing for sequencing   Upper Body Dressing : Minimal assistance;Sitting;Cueing for sequencing       Toilet Transfer: Minimal assistance;BSC/3in1;Cueing for safety;Cueing for sequencing Toilet Transfer Details (indicate cue type and reason): stand pivot Toileting- Clothing Manipulation and Hygiene: Maximal assistance;Sit to/from stand              Extremity/Trunk Assessment              Vision       Perception     Praxis     Communication Communication Communication: No apparent difficulties   Cognition Arousal: Alert Behavior During Therapy: Flat affect Cognition: Cognition impaired, No family/caregiver present to determine baseline   Orientation impairments: Situation, Time Awareness: Intellectual awareness impaired, Online awareness impaired   Attention impairment (select first level of impairment): Focused attention Executive functioning impairment (select all impairments): Reasoning, Problem solving, Initiation, Organization, Sequencing                   Following commands: Impaired Following commands impaired: Follows one step commands inconsistently, Follows one step commands with increased time  Cueing   Cueing Techniques: Verbal cues, Tactile cues, Visual cues  Exercises Other Exercises Other Exercises: edu re role of OT    Shoulder Instructions       General Comments step by step cueing throughout for sequencing, upon return back to bed, pt with emesis, nurse in room to address    Pertinent Vitals/ Pain       Pain Assessment Pain Assessment:  No/denies pain  Home Living                                          Prior Functioning/Environment              Frequency  Min 2X/week        Progress Toward Goals  OT Goals(current goals can now be found in the care plan section)  Progress towards OT goals: Progressing toward goals  Acute Rehab OT Goals Time For Goal Achievement: 07/25/24  Plan      Co-evaluation                 AM-PAC OT 6 Clicks Daily Activity     Outcome Measure   Help from another person eating meals?: A Little Help from another person taking care of personal grooming?: A Little Help from another person toileting, which includes using toliet, bedpan, or urinal?: A Lot Help from another person bathing (including washing, rinsing, drying)?: A Lot Help from another person to put on and taking off regular upper body clothing?: A Little Help from another person to put on and taking off regular lower body clothing?: A Lot 6 Click Score: 15    End of Session    OT Visit Diagnosis: Other abnormalities of gait and mobility (R26.89);Muscle weakness (generalized) (M62.81)   Activity Tolerance Patient tolerated treatment well   Patient Left in bed;with call bell/phone within reach;with nursing/sitter in room;with bed alarm set   Nurse Communication Mobility status        Time: 8983-8955 OT Time Calculation (min): 28 min  Charges: OT General Charges $OT Visit: 1 Visit OT Treatments $Self Care/Home Management : 8-22 mins $Therapeutic Activity: 8-22 mins  Therisa Sheffield, OTD OTR/L  07/15/2024, 11:01 AM

## 2024-07-15 NOTE — Discharge Summary (Addendum)
 DISCHARGE SUMMARY    Courtney Thomas FMW:[id number] DOB: Oct 20, 1945 DOA: 07/10/2024  PCP: Rilla Baller, MD  Admit date: 07/10/2024 Discharge date: 07/15/2024   Recommendations for Outpatient Follow-up:  Follow up with PCP in 1-2 weeks to to review chronic condition management.   Hospital Course: Courtney Thomas is a 78 year old with hypertension, COPD, osteoporosis, mild cognitive impairment, presented to the hospital with diarrhea, dark tarry stools, and generalized weakness.  Diarrhea began after completing antibiotics for an oral infection.  It has since resolved.  Her weakness persists and PT/OT are recommending discharge to SNF.   *After patient was discharged she was sitting on bedside commode which reportedly tipped over.  Patient denies any injury during the event.  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 Proceeding with DC.  Generalized weakness - Likely secondary to diarrhea - DC to SNF   Diarrhea - Resolved - Appears to have been secondary to recent antibiotic course.  C. difficile testing not performed on arrival.  No further diarrhea now   Melena - Hemoglobin remains stable.  No further diarrhea or melena per patient - Daily PPI - Referral to GI at DC for colonoscopy outpt   Hypokalemia - Resolved.   Hypertensive urgency - Resolved - Blood pressure better controlled.  Continue current meds   Hyperlipidemia - Continue meds   COPD, not currently in exacerbation - On room air - Continue home dose inhalers   Mild cognitive impairment - Continue donepezil  - Delirium precautions   Osteoporosis - Receives Prolia  outpatient    Discharge Instructions  Discharge Instructions     Ambulatory referral to Gastroenterology   Complete by: As directed    Melena in hospital   What is the reason for referral?: Colonoscopy   Call MD for:  difficulty breathing, headache or visual  disturbances   Complete by: As directed    Call MD for:  persistant dizziness or light-headedness   Complete by: As directed    Call MD for:  persistant nausea and vomiting   Complete by: As directed    Call MD for:  severe uncontrolled pain   Complete by: As directed    Call MD for:  temperature >100.4   Complete by: As directed    Diet general   Complete by: As directed    Discharge instructions   Complete by: As directed    Follow up with your primary care physician to discuss the medication changes during this admission   Increase activity slowly   Complete by: As directed       Allergies as of 07/15/2024       Reactions   Egg Protein-containing Drug Products Anaphylaxis, Swelling   Codeine Nausea Only   Penicillins Rash   .p        Medication List     STOP taking these medications    benzonatate  100 MG capsule Commonly known as: TESSALON    oxyCODONE -acetaminophen  5-325 MG tablet Commonly known as: Percocet       TAKE these medications    albuterol  108 (90 Base) MCG/ACT inhaler Commonly known as: VENTOLIN  HFA Inhale 2 puffs into the lungs every 6 (six) hours as needed for wheezing or shortness of breath.   atorvastatin  10 MG tablet Commonly known as: LIPITOR Take 1 tablet (10 mg total) by mouth daily. For cholesterol   budesonide -formoterol  80-4.5 MCG/ACT inhaler Commonly known as: Symbicort  Inhale 2 puffs into the lungs in  the morning and at bedtime.   buPROPion  ER 100 MG 12 hr tablet Commonly known as: WELLBUTRIN  SR Take 1 tablet (100 mg total) by mouth daily. For mood   cyanocobalamin  500 MCG tablet Commonly known as: VITAMIN B12 Take 1 tablet (500 mcg total) by mouth every Monday, Wednesday, and Friday.   donepezil  5 MG tablet Commonly known as: ARICEPT  Take 5 mg by mouth at bedtime.   DULoxetine  30 MG capsule Commonly known as: CYMBALTA  Take 1 capsule (30 mg total) by mouth daily. For mood   lisinopril  20 MG tablet Commonly known  as: ZESTRIL  Take 1 tablet (20 mg total) by mouth daily. Start taking on: July 16, 2024 What changed:  medication strength how much to take   pantoprazole  40 MG tablet Commonly known as: PROTONIX  Take 1 tablet (40 mg total) by mouth daily. Start taking on: July 16, 2024   Prolia  60 MG/ML Sosy injection Generic drug: denosumab  Inject into the skin.   propranolol  10 MG tablet Commonly known as: INDERAL  Take 1 tablet (10 mg total) by mouth 2 (two) times daily. For tremor   traZODone  50 MG tablet Commonly known as: DESYREL  Take 1 tablet (50 mg total) by mouth at bedtime.   Vitamin D3 25 MCG (1000 UT) Caps Take 1 capsule (1,000 Units total) by mouth daily.        Contact information for after-discharge care     Destination     [facility] .   Service: Skilled Nursing Contact information: [address] [place]  [phone]                    Allergies[1]  Consultations:    Procedures/Studies: No results found.    Discharge Exam: Vitals:   07/15/24 0552 07/15/24 0841  BP: (!) 153/55 (!) 161/65  Pulse: 61 72  Resp:    Temp:  98.3 F (36.8 C)  SpO2:  99%   Vitals:   07/14/24 1920 07/15/24 0332 07/15/24 0552 07/15/24 0841  BP: (!) 151/54 (!) 177/65 (!) 153/55 (!) 161/65  Pulse: 73 61 61 72  Resp:      Temp: 98.1 F (36.7 C) 97.6 F (36.4 C)  98.3 F (36.8 C)  TempSrc: Oral Oral  Oral  SpO2: 98% 100%  99%  Weight:  42.3 kg    Height:        Constitutional:  Normal appearance. Non toxic-appearing.  HENT: Head Normocephalic and atraumatic.  Mucous membranes are moist.  Eyes:  Extraocular intact. Conjunctivae normal.  Cardiovascular: Rate and Rhythm: Normal rate and regular rhythm.  Pulmonary: Non labored, symmetric rise of chest wall.  Skin: warm and dry. not jaundiced.  Neurological: alert. Oriented. Psychiatric: Mood and Affect congruent.    The results of significant diagnostics from  this hospitalization (including imaging, microbiology, ancillary and laboratory) are listed below for reference.     Microbiology: No results found for this or any previous visit (from the past 240 hours).   Labs: BNP (last 3 results) No results for input(s): BNP in the last 8760 hours. Basic Metabolic Panel: Recent Labs  Lab 07/10/24 0948 07/10/24 0949 07/10/24 2052 07/11/24 0511 07/12/24 0500 07/13/24 0333  NA  --  141  --  142 139 138  K  --  3.0*  --  2.5* 3.1* 3.5  CL  --  101  --  102 101 102  CO2  --  30  --  27 28 28   GLUCOSE  --  117*  --  85 88 96  BUN  --  9  --  5* 8 10  CREATININE  --  0.67  --  0.64 0.71 0.71  CALCIUM   --  9.4  --  8.8* 9.5 9.6  MG 1.9  --  1.8  --  2.1  --   PHOS 2.5  --   --   --  3.3  --    Liver Function Tests: Recent Labs  Lab 07/10/24 0949 07/12/24 0500  AST 12*  --   ALT 9  --   ALKPHOS 78  --   BILITOT 0.5  --   PROT 6.5  --   ALBUMIN 4.0 3.9   No results for input(s): LIPASE, AMYLASE in the last 168 hours. No results for input(s): AMMONIA in the last 168 hours. CBC: Recent Labs  Lab 07/10/24 0949 07/10/24 2052 07/11/24 0511 07/11/24 1813  WBC 7.4  --  4.8  --   HGB 13.3 11.7* 12.0 12.1  HCT 40.1 34.8* 36.6 37.4  MCV 90.5  --  90.6  --   PLT 264  --  209  --    Cardiac Enzymes: Recent Labs  Lab 07/10/24 1000  CKTOTAL 47   BNP: Invalid input(s): POCBNP CBG: Recent Labs  Lab 07/11/24 0737 07/12/24 0729 07/13/24 0734  GLUCAP 89 86 99   D-Dimer No results for input(s): DDIMER in the last 72 hours. Hgb A1c No results for input(s): HGBA1C in the last 72 hours. Lipid Profile No results for input(s): CHOL, HDL, LDLCALC, TRIG, CHOLHDL, LDLDIRECT in the last 72 hours. Thyroid  function studies No results for input(s): TSH, T4TOTAL, T3FREE, THYROIDAB in the last 72 hours.  Invalid input(s): FREET3 Anemia work up No results for input(s): VITAMINB12, FOLATE, FERRITIN,  TIBC, IRON , RETICCTPCT in the last 72 hours. Urinalysis    Component Value Date/Time   COLORURINE STRAW (A) 07/10/2024 1230   APPEARANCEUR CLEAR (A) 07/10/2024 1230   LABSPEC 1.006 07/10/2024 1230   PHURINE 7.0 07/10/2024 1230   GLUCOSEU NEGATIVE 07/10/2024 1230   GLUCOSEU NEGATIVE 05/24/2022 1547   HGBUR NEGATIVE 07/10/2024 1230   BILIRUBINUR NEGATIVE 07/10/2024 1230   BILIRUBINUR negative 01/09/2024 1238   KETONESUR NEGATIVE 07/10/2024 1230   PROTEINUR NEGATIVE 07/10/2024 1230   UROBILINOGEN 0.2 01/09/2024 1238   UROBILINOGEN 2.0 (A) 05/24/2022 1547   NITRITE NEGATIVE 07/10/2024 1230   LEUKOCYTESUR NEGATIVE 07/10/2024 1230   Sepsis Labs Recent Labs  Lab 07/10/24 0949 07/11/24 0511  WBC 7.4 4.8   Microbiology No results found for this or any previous visit (from the past 240 hours).   Time coordinating discharge: 32 min   SIGNED: Kenniya Westrich, DO [facility] 07/15/2024, 11:24 AM Pager   If 7PM-7AM, please contact night-coverage     [1]  Allergies Allergen Reactions   Egg Protein-Containing Drug Products Anaphylaxis and Swelling   Codeine Nausea Only   Penicillins Rash    .p

## 2024-07-15 NOTE — Progress Notes (Signed)
 This RN was in a different patient room when charge RN called to notify patient fell. Ambulatory aide answered patient call light and found patient on the floor tipped over from the bedside commode. Dr notified, CT ordered. Marcey, significant other notified as well. At first left a message and few minutes later hmet in the room. No injury noted during assessment and no changes to baseline.

## 2024-07-15 NOTE — TOC Progression Note (Signed)
 Transition of Care [facility]) - Progression Note    Patient Details  Name: Courtney Thomas MRN: [id number] Date of Birth: Sep 08, 1945  [facility]) CM/SW Contact  Corean ONEIDA Haddock, RN Phone Number: 07/15/2024, 10:12 AM  Clinical Narrative:     Shara received for [facility] Plan Auth ID [id number] Message sent to MD to determine if patient medically ready for dc  Expected Discharge Plan: Skilled Nursing Facility Barriers to Discharge: Continued Medical Work up               Expected Discharge Plan and Services In-house Referral: Clinical Social Work Discharge Planning Services: CM Consult                                           Social Drivers of Health (SDOH) Interventions SDOH Screenings   Food Insecurity: No Food Insecurity (07/11/2024)  Housing: Unknown (07/11/2024)  Transportation Needs: Patient Unable To Answer (07/11/2024)  Utilities: Patient Unable To Answer (07/11/2024)  Alcohol Screen: Low Risk (08/30/2022)  Depression (PHQ2-9): Medium Risk (05/01/2024)  Financial Resource Strain: Low Risk  (02/20/2024)   Received from [facility]  Physical Activity: Inactive (08/30/2022)  Social Connections: Patient Unable To Answer (07/11/2024)  Stress: No Stress Concern Present (08/30/2022)  Tobacco Use: Medium Risk (07/10/2024)    Readmission Risk Interventions     No data to display

## 2024-07-28 ENCOUNTER — Encounter: Payer: Self-pay | Admitting: Emergency Medicine

## 2024-07-28 ENCOUNTER — Emergency Department
Admission: EM | Admit: 2024-07-28 | Discharge: 2024-07-29 | Disposition: A | Discharge status: Skilled Nursing Facility | Attending: Emergency Medicine | Admitting: Emergency Medicine

## 2024-07-28 DIAGNOSIS — B9689 Other specified bacterial agents as the cause of diseases classified elsewhere: Secondary | ICD-10-CM | POA: Diagnosis not present

## 2024-07-28 DIAGNOSIS — N390 Urinary tract infection, site not specified: Secondary | ICD-10-CM | POA: Diagnosis not present

## 2024-07-28 DIAGNOSIS — W06XXXA Fall from bed, initial encounter: Secondary | ICD-10-CM | POA: Insufficient documentation

## 2024-07-28 DIAGNOSIS — W19XXXA Unspecified fall, initial encounter: Secondary | ICD-10-CM

## 2024-07-28 DIAGNOSIS — S0990XA Unspecified injury of head, initial encounter: Secondary | ICD-10-CM | POA: Diagnosis present

## 2024-07-28 LAB — COMPREHENSIVE METABOLIC PANEL WITH GFR
ALT: 8 U/L (ref 0–44)
AST: 11 U/L — ABNORMAL LOW (ref 15–41)
Albumin: 4.1 g/dL (ref 3.5–5.0)
Alkaline Phosphatase: 83 U/L (ref 38–126)
Anion gap: 11 (ref 5–15)
BUN: 10 mg/dL (ref 8–23)
CO2: 31 mmol/L (ref 22–32)
Calcium: 9.6 mg/dL (ref 8.9–10.3)
Chloride: 98 mmol/L (ref 98–111)
Creatinine, Ser: 0.63 mg/dL (ref 0.44–1.00)
GFR, Estimated: >60 mL/min
Glucose, Bld: 105 mg/dL — ABNORMAL HIGH (ref 70–99)
Potassium: 2.9 mmol/L — ABNORMAL LOW (ref 3.5–5.1)
Sodium: 141 mmol/L (ref 135–145)
Total Bilirubin: 0.3 mg/dL (ref 0.0–1.2)
Total Protein: 6.5 g/dL (ref 6.5–8.1)

## 2024-07-28 LAB — CBC
HCT: 38.4 % (ref 36.0–46.0)
Hemoglobin: 12.9 g/dL (ref 12.0–15.0)
MCH: 30.1 pg (ref 26.0–34.0)
MCHC: 33.6 g/dL (ref 30.0–36.0)
MCV: 89.7 fL (ref 80.0–100.0)
Platelets: 230 K/uL (ref 150–400)
RBC: 4.28 MIL/uL (ref 3.87–5.11)
RDW: 12.8 % (ref 11.5–15.5)
WBC: 7.9 K/uL (ref 4.0–10.5)
nRBC: 0 % (ref 0.0–0.2)

## 2024-07-28 NOTE — ED Triage Notes (Signed)
 Pt arrives via EMS from [facility] with reports of a unwitnessed fall today. Pt denies complaints, dementia.

## 2024-07-28 NOTE — ED Triage Notes (Signed)
 Pt to ED via [facility] from [facility]. Pt had unwitnessed fall at 4pm. No LOC. Pt reports hit her head on tile floor. Denies pain. No blood thinners. Hx of dementia. Daughter medical POA  184/71 97% RA HR 72

## 2024-07-29 ENCOUNTER — Emergency Department

## 2024-07-29 LAB — URINALYSIS, ROUTINE W REFLEX MICROSCOPIC
Bilirubin Urine: NEGATIVE
Glucose, UA: NEGATIVE mg/dL
Hgb urine dipstick: NEGATIVE
Ketones, ur: NEGATIVE mg/dL
Nitrite: POSITIVE — AB
Protein, ur: NEGATIVE mg/dL
Specific Gravity, Urine: 1.011 (ref 1.005–1.030)
WBC, UA: >50 WBC/hpf (ref 0–5)
pH: 7 (ref 5.0–8.0)

## 2024-07-29 LAB — TROPONIN T, HIGH SENSITIVITY: Troponin T High Sensitivity: <15 ng/L (ref 0–19)

## 2024-07-29 MED ORDER — SODIUM CHLORIDE 0.9 % IV SOLN
1.0000 g | Freq: Once | INTRAVENOUS | Status: AC
  Administered 2024-07-29: 1 g via INTRAVENOUS
  Filled 2024-07-29: qty 10

## 2024-07-29 MED ORDER — POTASSIUM CHLORIDE CRYS ER 20 MEQ PO TBCR
40.0000 meq | EXTENDED_RELEASE_TABLET | Freq: Once | ORAL | Status: AC
  Administered 2024-07-29: 40 meq via ORAL
  Filled 2024-07-29: qty 2

## 2024-07-29 MED ORDER — CEPHALEXIN 500 MG PO CAPS: 500.0000 mg | ORAL_CAPSULE | Freq: Three times a day (TID) | ORAL | 0 refills | Status: DC

## 2024-07-29 NOTE — ED Provider Notes (Signed)
 "  [facility] Provider Note    Event Date/Time   First MD Initiated Contact with Patient 07/29/24 0002     (approximate)   History   Fall   HPI  Courtney Thomas is a 78 y.o. female presents to the emergency department today after an unwitnessed fall.  Patient was found lying next to her bed.  She states that she rolled off of it.  She states this has happened before.  Is unclear exactly how long she might have been on the ground.  She states she did hit her head but denies loss of consciousness.  Apparently had 1 episode of vomiting after the fall.  Patient denies any pain in her extremities.  She denies any recent illness.     Physical Exam   Triage Vital Signs: ED Triage Vitals [07/28/24 1846]  Encounter Vitals Group     BP (!) 177/71     Girls Systolic BP Percentile      Girls Diastolic BP Percentile      Boys Systolic BP Percentile      Boys Diastolic BP Percentile      Pulse Rate 66     Resp 16     Temp 97.9 F (36.6 C)     Temp Source Oral     SpO2 97 %     Weight 93 lb 4.1 oz (42.3 kg)     Height 4' 10 (1.473 m)     Head Circumference      Peak Flow      Pain Score 0     Pain Loc      Pain Education      Exclude from Growth Chart     Most recent vital signs: Vitals:   07/28/24 1846  BP: (!) 177/71  Pulse: 66  Resp: 16  Temp: 97.9 F (36.6 C)  SpO2: 97%   General: Awake, alert, oriented. CV:  Good peripheral perfusion. Regular rate and rhythm. Resp:  Normal effort. Lungs clear. Abd:  No distention. Non tender.   ED Results / Procedures / Treatments   Labs (all labs ordered are listed, but only abnormal results are displayed) Labs Reviewed  COMPREHENSIVE METABOLIC PANEL WITH GFR - Abnormal; Notable for the following components:      Result Value   Potassium 2.9 (*)    Glucose, Bld 105 (*)    AST 11 (*)    All other components within normal limits  URINALYSIS, ROUTINE W REFLEX MICROSCOPIC - Abnormal; Notable for the  following components:   Color, Urine YELLOW (*)    APPearance CLOUDY (*)    Nitrite POSITIVE (*)    Leukocytes,Ua SMALL (*)    Bacteria, UA MANY (*)    All other components within normal limits  CBC  TROPONIN T, HIGH SENSITIVITY     EKG  I, Guadalupe Eagles, attending physician, personally viewed and interpreted this EKG  EKG Time: 1850 Rate: 66 Rhythm: normal sinus rhythm Axis: normal Intervals: qtc 440 QRS: narrow ST changes: no st elevation Impression: normal ekg    RADIOLOGY I independently interpreted and visualized the CT head. My interpretation: No ICH Radiology interpretation:  IMPRESSION:  1. No acute intracranial abnormality.   I independently interpreted and visualized the CT cervical spine. My interpretation: No fracture Radiology interpretation:  IMPRESSION:  1. No acute fracture or malalignment.      PROCEDURES:  Critical Care performed: No    MEDICATIONS ORDERED IN ED: Medications - No data  to display   IMPRESSION / MDM / ASSESSMENT AND PLAN / ED COURSE  I reviewed the triage vital signs and the nursing notes.                              Differential diagnosis includes, but is not limited to, ICH, concussion, fracture, anemia, electrolyte abnormality, infection  Patient's presentation is most consistent with acute presentation with potential threat to life or bodily function.  Patient presented to the emergency department today after falling out of bed.  She did hit her head.  On exam no signs of significant trauma.  Did get head and cervical spine CT which did not show any acute abnormality.  Blood work without significant urine was consistent with urinary tract infection.  Will give dose of IV antibiotics here but I do think he needs both patient to continue treatment as an outpatient with oral antibiotics.  Discussed findings and plan with patient.   FINAL CLINICAL IMPRESSION(S) / ED DIAGNOSES   Final diagnoses:  Fall, initial  encounter       Note:  This document was prepared using Dragon voice recognition software and may include unintentional dictation errors.    Floy Roberts, MD 07/29/24 [phone]  "

## 2024-08-02 ENCOUNTER — Ambulatory Visit: Admitting: Family Medicine

## 2024-08-05 ENCOUNTER — Ambulatory Visit: Admitting: Family Medicine

## 2024-08-05 ENCOUNTER — Telehealth: Payer: Self-pay

## 2024-08-05 NOTE — Telephone Encounter (Signed)
 Don't see in [address] have you received this?

## 2024-08-05 NOTE — Telephone Encounter (Signed)
 Copied from CRM #[id number]. Topic: General - Other >> Aug 05, 2024  9:39 AM Joesph NOVAK wrote: Reason for CRM: [facility] faxed over a med clearance form and needs it filled out by provider before patients appt. Please advise.  PH: [phone] FAX: [phone]

## 2024-08-06 NOTE — Telephone Encounter (Signed)
 I have not seen this.  Pt missed hospital f/u appt yesterday.

## 2024-08-07 NOTE — Telephone Encounter (Signed)
 Called patient to reschedule follow up. Left message to call office.   Please provide message from provider/office when call is returned from patient.

## 2024-08-13 ENCOUNTER — Telehealth: Payer: Self-pay | Admitting: Family Medicine

## 2024-08-13 NOTE — Telephone Encounter (Signed)
 I called and LVM.

## 2024-08-13 NOTE — Telephone Encounter (Signed)
 Copied from CRM [id number]. Topic: General - Other >> Aug 13, 2024 11:25 AM Willma R wrote: Reason for CRM: lindsey from [facility] will be faxing over a med clearance form and needs it filled out by provider before patients appt.  Said originally sent on 07/09/24 but notes in encounter show was never received   PH: [phone] FAX: [phone]

## 2024-08-14 NOTE — Telephone Encounter (Signed)
 I have received EFAX from [facility] and set it in pending folder to be filled out upon pt's next visit. I have attempted to contact pt with no answer, I left voicemail instructing her to please call in to get scheduled. Pt needs to be seen for Pre-op form and for hospital follow up, Dr. Rilla stated we can do both of these all in 1 appt.  If Pt calls back, Please schedule her for Hsp FU/ pre-op appt

## 2024-08-14 NOTE — Telephone Encounter (Signed)
 Patient needs to be seen to have filled out.

## 2024-08-15 NOTE — Telephone Encounter (Signed)
 I called and lvm

## 2024-08-18 ENCOUNTER — Emergency Department
Admission: EM | Admit: 2024-08-18 | Discharge: 2024-08-18 | Disposition: A | Discharge status: Home / Self Care | Attending: Emergency Medicine | Admitting: Emergency Medicine

## 2024-08-18 ENCOUNTER — Other Ambulatory Visit: Payer: Self-pay

## 2024-08-18 ENCOUNTER — Emergency Department

## 2024-08-18 DIAGNOSIS — W01198A Fall on same level from slipping, tripping and stumbling with subsequent striking against other object, initial encounter: Secondary | ICD-10-CM | POA: Diagnosis not present

## 2024-08-18 DIAGNOSIS — J45909 Unspecified asthma, uncomplicated: Secondary | ICD-10-CM | POA: Diagnosis not present

## 2024-08-18 DIAGNOSIS — Y92002 Bathroom of unspecified non-institutional (private) residence single-family (private) house as the place of occurrence of the external cause: Secondary | ICD-10-CM | POA: Insufficient documentation

## 2024-08-18 DIAGNOSIS — I10 Essential (primary) hypertension: Secondary | ICD-10-CM | POA: Diagnosis not present

## 2024-08-18 DIAGNOSIS — S0083XA Contusion of other part of head, initial encounter: Secondary | ICD-10-CM | POA: Insufficient documentation

## 2024-08-18 DIAGNOSIS — W19XXXA Unspecified fall, initial encounter: Secondary | ICD-10-CM

## 2024-08-18 MED ORDER — ACETAMINOPHEN 500 MG PO TABS: 500.0000 mg | ORAL_TABLET | Freq: Three times a day (TID) | ORAL | 0 refills | Status: AC

## 2024-08-18 NOTE — ED Triage Notes (Signed)
 Pt comes with c/o fall from [facility]. Pt denies any thinners or loc. Pt does have hematoma above right eyebrow. Pt states she tripped and fell and hit her head.   Pt states head pain.

## 2024-08-18 NOTE — ED Notes (Signed)
 Xray will bring pt to ED 54 after xray

## 2024-08-18 NOTE — ED Notes (Signed)
 This clinical research associate assisted the patient to the bathroom. 1 person assist to and back from the bathroom.

## 2024-08-18 NOTE — ED Provider Notes (Signed)
 "   [facility] Emergency Department Provider Note     Event Date/Time   First MD Initiated Contact with Patient 08/18/24 1809     (approximate)   History   Fall   HPI  Courtney Thomas is a 79 y.o. female with a history of HTN, HLD, osteoporosis, adjustment disorder with depression, and asthma, presents to the ED accompanied by her husband.  Patient is currently at [facility], for rehab due to mobility and gait issues.  Patient reports that she fell in her room, while trying to get to the bathroom.  By the patient's husband's report, she rang out for assistance to get to the bathroom, but after the staff took too long, she attempt to get herself up, falling hitting the right side of her head.  No reports of any LOC.  Patient presents to the ED only endorsing some swelling to the right forehead.  No other injury reported at this time.  Physical Exam   Triage Vital Signs: ED Triage Vitals  Encounter Vitals Group     BP 08/18/24 1756 (!) 174/64     Girls Systolic BP Percentile --      Girls Diastolic BP Percentile --      Boys Systolic BP Percentile --      Boys Diastolic BP Percentile --      Pulse Rate 08/18/24 1756 66     Resp 08/18/24 1756 18     Temp 08/18/24 1756 97.8 F (36.6 C)     Temp Source 08/18/24 1756 Oral     SpO2 08/18/24 1756 99 %     Weight --      Height --      Head Circumference --      Peak Flow --      Pain Score 08/18/24 1752 4     Pain Loc --      Pain Education --      Exclude from Growth Chart --     Most recent vital signs: Vitals:   08/18/24 1756  BP: (!) 174/64  Pulse: 66  Resp: 18  Temp: 97.8 F (36.6 C)  SpO2: 99%    General Awake, no distress. NAD HEENT NCAT, except for hematoma over the right brow. PERRL. EOMI. No epistaxis. Mucous membranes are moist.  CV:  Good peripheral perfusion. RRR RESP:  Normal effort. CTA ABD:  No distention.  MSK:  AROM of all extremities.  No joint effusions  appreciated. NEURO: Cranial nerves II to XII grossly intact.   ED Results / Procedures / Treatments   Labs (all labs ordered are listed, but only abnormal results are displayed) Labs Reviewed - No data to display   EKG   RADIOLOGY  I personally viewed and evaluated these images as part of my medical decision making, as well as reviewing the written report by the radiologist.  ED Provider Interpretation: No acute CT findings  CT Cervical Spine Wo Contrast Result Date: 08/18/2024 EXAM: CT CERVICAL SPINE WITHOUT CONTRAST 08/18/2024 06:10:26 PM TECHNIQUE: CT of the cervical spine was performed without the administration of intravenous contrast. Multiplanar reformatted images are provided for review. Automated exposure control, iterative reconstruction, and/or weight based adjustment of the mA/kV was utilized to reduce the radiation dose to as low as reasonably achievable. COMPARISON: None available. CLINICAL HISTORY: Fall. FINDINGS: BONES AND ALIGNMENT: No acute fracture or traumatic malalignment. 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 Moderate diffuse  bilateral degenerative facet disease. SOFT TISSUES: No prevertebral soft tissue swelling. LUNG APICES: Emphysema and biapical scarring in the lung apices. IMPRESSION: 1. No evidence of acute traumatic injury. 2. Multilevel degenerative changes. Electronically signed by: Franky Crease MD 08/18/2024 06:26 PM EST RP Workstation: [id number]   CT Maxillofacial Wo Contrast Result Date: 08/18/2024 EXAM: CT OF THE FACE WITHOUT CONTRAST 08/18/2024 06:10:26 PM TECHNIQUE: CT of the face was performed without the administration of intravenous contrast. Multiplanar reformatted images are provided for review. Automated exposure control, iterative reconstruction, and/or weight based adjustment of the mA/kV was utilized to reduce the radiation dose to as low as reasonably achievable. COMPARISON:  None available. CLINICAL HISTORY: fall FINDINGS: FACIAL BONES: No acute facial fracture. No mandibular dislocation. No suspicious bone lesion. ORBITS: Globes are intact. No acute traumatic injury. No inflammatory change. SINUSES AND MASTOIDS: No acute abnormality. SOFT TISSUES: Soft tissue swelling over the right orbit and forehead. IMPRESSION: 1. No acute facial or orbital fracture. 2. Soft tissue swelling over the right orbit and forehead. Electronically signed by: Franky Crease MD 08/18/2024 06:24 PM EST RP Workstation: [id number]   CT Head Wo Contrast Result Date: 08/18/2024 EXAM: CT HEAD WITHOUT CONTRAST 08/18/2024 06:10:26 PM TECHNIQUE: CT of the head was performed without the administration of intravenous contrast. Automated exposure control, iterative reconstruction, and/or weight based adjustment of the mA/kV was utilized to reduce the radiation dose to as low as reasonably achievable. COMPARISON: 07/29/2024. CLINICAL HISTORY: fall fall fall FINDINGS: BRAIN AND VENTRICLES: No acute hemorrhage. No evidence of acute infarct. No hydrocephalus. No extra-axial collection. No mass effect or midline shift. Atrophy and chronic small vessel disease throughout the deep white matter. ORBITS: No acute abnormality. SINUSES: No acute abnormality. SOFT TISSUES AND SKULL: No acute soft tissue abnormality. No skull fracture. IMPRESSION: 1. No acute intracranial abnormality. Electronically signed by: Franky Crease MD 08/18/2024 06:23 PM EST RP Workstation: [id number]     PROCEDURES:  Critical Care performed: No  Procedures   MEDICATIONS ORDERED IN ED: Medications - No data to display   IMPRESSION / MDM / ASSESSMENT AND PLAN / ED COURSE  I reviewed the triage vital signs and the nursing notes.                              Differential diagnosis includes, but is not limited to, facial contusion, facial bone fracture, SDH, skull fracture, cervical radiculopathy, cervical fracture  Patient's presentation is  most consistent with acute complicated illness / injury requiring diagnostic workup.  Patient's diagnosis is consistent with mechanical fall resulted in facial contusion.  Geriatric patient presents to the ED from her physically, where she had a mechanical fall after attempting to ambulate to the bathroom.  She presents primarily complaining about a hematoma to the right brow.  No reported LOC.  Patient in no acute distress on presentation for exam overall reassuring.  No gross neuromuscular deficits noted.  CT images interpreted by me, of the head, face, cervical spine, showed no acute findings.  Patient will be discharged home with prescriptions for Tylenol . Patient is to follow up with her primary provider as suggested, as needed or otherwise directed. Patient is given ED precautions to return to the ED for any worsening or new symptoms.   FINAL CLINICAL IMPRESSION(S) / ED DIAGNOSES   Final diagnoses:  Fall, initial encounter  Contusion of face, initial encounter     Rx / DC Orders   ED Discharge Orders  Ordered    acetaminophen  (TYLENOL ) 500 MG tablet  3 times daily        08/18/24 1850             Note:  This document was prepared using Dragon voice recognition software and may include unintentional dictation errors.    Loyd Candida LULLA Ald[place], [place] 08/18/24 1912  "

## 2024-08-18 NOTE — Discharge Instructions (Addendum)
 Your exam and CT scans are normal and reassuring.  No signs of any serious injury related to your fall.  You may take OTC Tylenol  every 8 hours for pain as needed.  You may experience some bruising or swelling from the forehead that may settle around the right eye.  Follow-up with your primary provider for ongoing evaluation.

## 2024-08-31 ENCOUNTER — Inpatient Hospital Stay
Admission: EM | Admit: 2024-08-31 | Source: Home / Self Care | Attending: Internal Medicine | Admitting: Internal Medicine

## 2024-08-31 ENCOUNTER — Other Ambulatory Visit: Payer: Self-pay

## 2024-08-31 ENCOUNTER — Emergency Department

## 2024-08-31 DIAGNOSIS — R531 Weakness: Secondary | ICD-10-CM

## 2024-08-31 DIAGNOSIS — N390 Urinary tract infection, site not specified: Secondary | ICD-10-CM | POA: Insufficient documentation

## 2024-08-31 DIAGNOSIS — I10 Essential (primary) hypertension: Secondary | ICD-10-CM | POA: Diagnosis present

## 2024-08-31 DIAGNOSIS — E43 Unspecified severe protein-calorie malnutrition: Secondary | ICD-10-CM | POA: Insufficient documentation

## 2024-08-31 DIAGNOSIS — E860 Dehydration: Secondary | ICD-10-CM

## 2024-08-31 DIAGNOSIS — E876 Hypokalemia: Secondary | ICD-10-CM | POA: Insufficient documentation

## 2024-08-31 DIAGNOSIS — R4182 Altered mental status, unspecified: Principal | ICD-10-CM | POA: Diagnosis present

## 2024-08-31 HISTORY — DX: Cognitive communication deficit: R41.841

## 2024-08-31 HISTORY — DX: Muscle wasting and atrophy, not elsewhere classified, unspecified site: M62.50

## 2024-08-31 HISTORY — DX: Chronic obstructive pulmonary disease, unspecified: J44.9

## 2024-08-31 LAB — COMPREHENSIVE METABOLIC PANEL WITH GFR
ALT: 11 U/L (ref 0–44)
AST: 11 U/L — ABNORMAL LOW (ref 15–41)
Albumin: 4.2 g/dL (ref 3.5–5.0)
Alkaline Phosphatase: 89 U/L (ref 38–126)
Anion gap: 13 (ref 5–15)
BUN: 10 mg/dL (ref 8–23)
CO2: 31 mmol/L (ref 22–32)
Calcium: 9.7 mg/dL (ref 8.9–10.3)
Chloride: 99 mmol/L (ref 98–111)
Creatinine, Ser: 0.7 mg/dL (ref 0.44–1.00)
GFR, Estimated: >60 mL/min
Glucose, Bld: 95 mg/dL (ref 70–99)
Potassium: 2.9 mmol/L — ABNORMAL LOW (ref 3.5–5.1)
Sodium: 142 mmol/L (ref 135–145)
Total Bilirubin: 0.7 mg/dL (ref 0.0–1.2)
Total Protein: 6.8 g/dL (ref 6.5–8.1)

## 2024-08-31 LAB — URINALYSIS, ROUTINE W REFLEX MICROSCOPIC
Bilirubin Urine: NEGATIVE
Glucose, UA: NEGATIVE mg/dL
Hgb urine dipstick: NEGATIVE
Ketones, ur: NEGATIVE mg/dL
Nitrite: POSITIVE — AB
Protein, ur: NEGATIVE mg/dL
Specific Gravity, Urine: 1.016 (ref 1.005–1.030)
Squamous Epithelial / HPF: 0 /HPF (ref 0–5)
pH: 5 (ref 5.0–8.0)

## 2024-08-31 LAB — CBC
HCT: 39.4 % (ref 36.0–46.0)
Hemoglobin: 12.8 g/dL (ref 12.0–15.0)
MCH: 29.8 pg (ref 26.0–34.0)
MCHC: 32.5 g/dL (ref 30.0–36.0)
MCV: 91.8 fL (ref 80.0–100.0)
Platelets: 247 10*3/uL (ref 150–400)
RBC: 4.29 MIL/uL (ref 3.87–5.11)
RDW: 14.1 % (ref 11.5–15.5)
WBC: 8.8 10*3/uL (ref 4.0–10.5)
nRBC: 0 % (ref 0.0–0.2)

## 2024-08-31 LAB — PHOSPHORUS: Phosphorus: 3.7 mg/dL (ref 2.5–4.6)

## 2024-08-31 LAB — MAGNESIUM: Magnesium: 1.9 mg/dL (ref 1.7–2.4)

## 2024-08-31 LAB — TROPONIN T, HIGH SENSITIVITY
Troponin T High Sensitivity: 17 ng/L (ref 0–19)
Troponin T High Sensitivity: 17 ng/L (ref 0–19)

## 2024-08-31 MED ORDER — PROPRANOLOL HCL 20 MG PO TABS
10.0000 mg | ORAL_TABLET | Freq: Two times a day (BID) | ORAL | Status: AC
  Administered 2024-08-31 – 2024-09-06 (×12): 10 mg via ORAL
  Filled 2024-08-31 (×13): qty 1

## 2024-08-31 MED ORDER — POTASSIUM CHLORIDE 10 MEQ/100ML IV SOLN
10.0000 meq | INTRAVENOUS | Status: AC
  Administered 2024-08-31 (×3): 10 meq via INTRAVENOUS
  Filled 2024-08-31 (×3): qty 100

## 2024-08-31 MED ORDER — ACETAMINOPHEN 650 MG RE SUPP: 650.0000 mg | Freq: Four times a day (QID) | RECTAL | Status: AC | PRN

## 2024-08-31 MED ORDER — FLUTICASONE FUROATE-VILANTEROL 100-25 MCG/ACT IN AEPB
1.0000 | INHALATION_SPRAY | Freq: Every day | RESPIRATORY_TRACT | Status: AC
  Administered 2024-09-02 – 2024-09-06 (×5): 1 via RESPIRATORY_TRACT
  Filled 2024-08-31 (×2): qty 28

## 2024-08-31 MED ORDER — ENOXAPARIN SODIUM 40 MG/0.4ML IJ SOSY
40.0000 mg | PREFILLED_SYRINGE | INTRAMUSCULAR | Status: AC
  Administered 2024-08-31 – 2024-09-06 (×7): 40 mg via SUBCUTANEOUS
  Filled 2024-08-31 (×7): qty 0.4

## 2024-08-31 MED ORDER — ONDANSETRON HCL 4 MG PO TABS: 4.0000 mg | ORAL_TABLET | Freq: Four times a day (QID) | ORAL | Status: AC | PRN

## 2024-08-31 MED ORDER — LISINOPRIL 20 MG PO TABS
20.0000 mg | ORAL_TABLET | Freq: Every day | ORAL | Status: AC
  Administered 2024-08-31 – 2024-09-06 (×7): 20 mg via ORAL
  Filled 2024-08-31: qty 2
  Filled 2024-08-31: qty 1
  Filled 2024-08-31: qty 2
  Filled 2024-08-31 (×3): qty 1
  Filled 2024-08-31: qty 2

## 2024-08-31 MED ORDER — POTASSIUM CHLORIDE CRYS ER 20 MEQ PO TBCR: 40.0000 meq | EXTENDED_RELEASE_TABLET | Freq: Every day | ORAL | Status: DC

## 2024-08-31 MED ORDER — SODIUM CHLORIDE 0.9 % IV SOLN
1.0000 g | Freq: Once | INTRAVENOUS | Status: AC
  Administered 2024-08-31: 1 g via INTRAVENOUS
  Filled 2024-08-31: qty 10

## 2024-08-31 MED ORDER — SODIUM CHLORIDE 0.9 % IV SOLN
1.0000 g | INTRAVENOUS | Status: DC
  Administered 2024-09-01 – 2024-09-02 (×2): 1 g via INTRAVENOUS
  Filled 2024-08-31 (×2): qty 10

## 2024-08-31 MED ORDER — POLYETHYLENE GLYCOL 3350 17 G PO PACK: 17.0000 g | PACK | Freq: Every day | ORAL | Status: AC | PRN

## 2024-08-31 MED ORDER — ACETAMINOPHEN 325 MG PO TABS
650.0000 mg | ORAL_TABLET | Freq: Four times a day (QID) | ORAL | Status: AC | PRN
  Administered 2024-09-06 (×2): 650 mg via ORAL
  Filled 2024-08-31 (×2): qty 2

## 2024-08-31 MED ORDER — LACTATED RINGERS IV BOLUS
1000.0000 mL | Freq: Once | INTRAVENOUS | Status: AC
  Administered 2024-08-31: 1000 mL via INTRAVENOUS

## 2024-08-31 MED ORDER — PANTOPRAZOLE SODIUM 40 MG PO TBEC
40.0000 mg | DELAYED_RELEASE_TABLET | Freq: Every day | ORAL | Status: AC
  Administered 2024-08-31 – 2024-09-06 (×7): 40 mg via ORAL
  Filled 2024-08-31 (×7): qty 1

## 2024-08-31 MED ORDER — ONDANSETRON HCL 4 MG/2ML IJ SOLN: 4.0000 mg | Freq: Four times a day (QID) | INTRAMUSCULAR | Status: AC | PRN

## 2024-08-31 NOTE — ED Provider Notes (Signed)
 Courtney Thomas Provider Note    Event Date/Time   First MD Initiated Contact with Patient 08/31/24 1637     (approximate)   History   Altered Mental Status and possible UTI   HPI  Courtney Thomas is a 79 y.o. female with history of dementia, asthma, hypertension, hyperlipidemia, presenting with altered mental status.  She states that she has a cough, she does have right anterior rib pain from the fall on Thursday.  Had some nausea vomiting yesterday but no diarrhea.  She denies any focal weakness or numbness.  Per independent history from family, she became confused yesterday.  He thinks he might have a UTI.  States that she also had a fall on Thursday, is complaining about right sided rib pain from that.  States that she had a fall slightly more than a week ago and sustained some periorbital ecchymoses.  States that she got a CAT scan of her head during that fall and that was negative.  On independent chart review, she did have a CT scan done on 18 January, CT head without acute intracranial hemorrhage, max face without acute fracture.     Physical Exam   Triage Vital Signs: ED Triage Vitals  Encounter Vitals Group     BP 08/31/24 1226 (!) 196/93     Girls Systolic BP Percentile --      Girls Diastolic BP Percentile --      Boys Systolic BP Percentile --      Boys Diastolic BP Percentile --      Pulse Rate 08/31/24 1220 96     Resp 08/31/24 1220 20     Temp 08/31/24 1221 98.4 F (36.9 C)     Temp Source 08/31/24 1221 Oral     SpO2 08/31/24 1220 95 %     Weight 08/31/24 1223 100 lb (45.4 kg)     Height 08/31/24 1223 5' (1.524 m)     Head Circumference --      Peak Flow --      Pain Score --      Pain Loc --      Pain Education --      Exclude from Growth Chart --     Most recent vital signs: Vitals:   08/31/24 1700 08/31/24 1831  BP: (!) 141/74 (!) 128/98  Pulse: 96 94  Resp: 18 17  Temp:  98.8 F (37.1 C)  SpO2: 98% 99%      General: Awake, no distress.  CV:  Good peripheral perfusion.  Resp:  Normal effort.  Mild right anterior lower rib tenderness, no tachypnea or respiratory distress Abd:  No distention.  Soft nontender Other:  Mildly dry mucous membranes, no slurred speech or facial droop, she has right periorbital ecchymoses that is old, no focal weakness or numbness, no lower extremity edema   ED Results / Procedures / Treatments   Labs (all labs ordered are listed, but only abnormal results are displayed) Labs Reviewed  COMPREHENSIVE METABOLIC PANEL WITH GFR - Abnormal; Notable for the following components:      Result Value   Potassium 2.9 (*)    AST 11 (*)    All other components within normal limits  URINALYSIS, ROUTINE W REFLEX MICROSCOPIC - Abnormal; Notable for the following components:   Color, Urine AMBER (*)    APPearance CLOUDY (*)    Nitrite POSITIVE (*)    Leukocytes,Ua MODERATE (*)    Bacteria, UA FEW (*)  All other components within normal limits  CBC  MAGNESIUM   PHOSPHORUS  CBG MONITORING, ED  TROPONIN T, HIGH SENSITIVITY  TROPONIN T, HIGH SENSITIVITY     EKG  EKG shows, sinus rhythm, rate 94, normal QS, normal QTc, no obvious ischemic ST elevation, T wave flattening to aVL, not significantly changed compared to prior   RADIOLOGY On my independent interpretation, chest x-ray without obvious fracture   PROCEDURES:  Critical Care performed: No  Procedures   MEDICATIONS ORDERED IN ED: Medications  potassium chloride  10 mEq in 100 mL IVPB (10 mEq Intravenous New Bag/Given 08/31/24 1846)  cefTRIAXone  (ROCEPHIN ) 1 g in sodium chloride  0.9 % 100 mL IVPB (1 g Intravenous New Bag/Given 08/31/24 2017)  lactated ringers  bolus 1,000 mL (0 mLs Intravenous Paused 08/31/24 2018)     IMPRESSION / MDM / ASSESSMENT AND PLAN / ED COURSE  I reviewed the triage vital signs and the nursing notes.                              Differential diagnosis includes, but is not  limited to, electrolyte derangements, UTI, pneumonia, rib fracture, contusion, strain, sprain, atypical ACS.  No recent falls or trauma in the last several days, with slightly atraumatic intracranial process.  Will get labs, EKG, troponin, chest x-ray, UA, CT head.  IV fluids.  Patient's presentation is most consistent with acute presentation with potential threat to life or bodily function.  Independent interpretation of labs and imaging below.  Given her UTI, altered mental status, hypokalemia, dehydration, she will need to come in for further management.  Consult to hospitalist, she is admitted.    Clinical Course as of 08/31/24 2018  Sat Aug 31, 2024  1759 CT Head Wo Contrast 1. Stable head CT, no acute intracranial process.  [TT]  1808 Independent review of labs, no leukocytosis, she is hypokalemic, will add on mag and Phos, LFTs are not elevated.  Will replete her potassium. [TT]  1917 DG Chest 1 View IMPRESSION: 1. No acute cardiopulmonary abnormality. 2. Emphysema.   [TT]  1943 UA shows positive leuks and nitrites, few bacteria. [TT]  1944 UA consistent with UTI, I will start her on IV antibiotics, prior urine culture grew E. coli susceptible to ceftriaxone . [TT]    Clinical Course User Index [TT] Waymond Lorelle Cummins, MD     FINAL CLINICAL IMPRESSION(S) / ED DIAGNOSES   Final diagnoses:  Altered mental status, unspecified altered mental status type  Dehydration  Hypokalemia  Urinary tract infection without hematuria, site unspecified     Rx / DC Orders   ED Discharge Orders     None        Note:  This document was prepared using Dragon voice recognition software and may include unintentional dictation errors.    Waymond Lorelle Cummins, MD 08/31/24 2019

## 2024-08-31 NOTE — ED Triage Notes (Signed)
 Pt to ED [facility] from [facility] for AMS and cloudy urine. Unknown onset of AMS and has cognitive communication deficit at baseline. Unable to get any history from pt. Pt is alert.

## 2024-08-31 NOTE — Assessment & Plan Note (Signed)
 Kidney function normal.  Mag level 1.9.   - potassium supplement daily - recheck bmp daily

## 2024-08-31 NOTE — Assessment & Plan Note (Signed)
 Has been at [facility] for approximately 6 weeks.  Has not had significant improvement in ambulation during that time.  Has had multiple falls while at SNF reportedly.   - consulting pt/ot.   - consulting transitions of care - consider placement in new facility if possible.

## 2024-08-31 NOTE — H&P (Signed)
 " History and Physical    Courtney Thomas FMW:[id number] DOB: 14-Jan-1946 DOA: 08/31/2024  DOS: the patient was seen and examined on 08/31/2024  PCP: Rilla Baller, MD   Patient coming from: SNF  I have personally briefly reviewed patient's old medical records in [facility]  No notes on file   ED Course:   Ms. Threat is an elderly female brought to the ED from the skilled nursing facility (SNF) [facility], where she has resided for approximately one month after losing the ability to walk following a dental abscess that developed the day after Thanksgiving. Long time partner, Courtney Thomas, reports Therapy at the facility has been limited (?30 min PT sessions twice weekly). Patient remains largely wheelchair-bound near the nurses station for supervision. - today pt could not recall the month, and stated year was 2022.  Oriented to location and situation.   - Three documented falls at the SNF; most recent resulted in a bruise to the eye noted a couple weeks ago. - Yesterday the patient was sick all day, stayed in bed, had minimal oral intake, and family noted clothing soaked with urine; family reports patient was left in the same wet clothing for three days before being changed; today brought to ED for further evaluation. - Symptoms today: generalized weakness, intermittent confusion (some days does not know location), intermittent nausea without vomiting, episodes of coughing/choking with expectoration of clear phlegm, no current pain. Courtney Thomas Courtney states that patient has not been given her inhalers while at the facility.   - pt does not use oxygen at baseline - prior to hospital admission that lead to SNF placement, pt lived at home and was fully ambulatory without assistive devices. Family Braden Thomas and daughter in [place]) visit 2-3 times daily.  Family: Daughter (in [place]) designated healthcare power of attorney.   Review of Systems:  ROS  Past Medical History:   Diagnosis Date   Adjustment disorder with depressed mood    Asthma    B12 deficiency anemia 2014   mild   Cognitive communication deficit    COPD (chronic obstructive pulmonary disease) (HCC)    Frequent headaches    History of chicken pox    Hyperlipidemia    Hypertension    Insomnia    Muscle wasting and atrophy, not elsewhere classified, unspecified site    Osteoporosis    latest dexa 05/2016 T -3.3 spine (unchanged), -2.0 hip - on fosamax  since ~2015   Seasonal allergic rhinitis     Past Surgical History:  Procedure Laterality Date   ABDOMINAL HYSTERECTOMY     BREAST CYST ASPIRATION Left 80s   benign   BREAST EXCISIONAL BIOPSY Right 1971   neg   COLONOSCOPY  07/2013   diverticulosis, erythematous mucosa in sigmoid - biopsy with focal vascular congestion/edema without colitis or dysplasia ([facility] at [facility])   COLONOSCOPY N/A 09/22/2022   poor prep, rpt 6 mo [facility])   dexa  03/2014   T score spine -3.5, hip -2.0   ESOPHAGOGASTRODUODENOSCOPY N/A 09/22/2022   nonbleeding gastric ulcers with clean base, duodenal dvierticulum, schatzki ring dilated, biopsy with mild chronic gastritis, H pylori neg [facility])   NASAL SINUS SURGERY     deviated septum   TONSILLECTOMY  1970   TOTAL ABDOMINAL HYSTERECTOMY W/ BILATERAL SALPINGOOPHORECTOMY  1994   uterine cyst with heavy bleeding     reports that she has quit smoking. She has never used smokeless tobacco. She reports that she does not drink alcohol and does  not use drugs.  Allergies[1]  Family History  Problem Relation Age of Onset   Cancer Mother 28       lung met to brain, smoker   Stroke Mother    Alzheimer's disease Mother    Arthritis Father    Arthritis Sister    Cancer Brother 22       pancreatic   Diabetes Maternal Grandmother    Breast cancer Neg Hx     Prior to Admission medications  Medication Sig Start Date End Date Taking? Authorizing Provider  albuterol  (VENTOLIN  HFA) 108 (90 Base) MCG/ACT inhaler Inhale 2  puffs into the lungs every 6 (six) hours as needed for wheezing or shortness of breath. 05/01/24   Rilla Baller, MD  atorvastatin  (LIPITOR) 10 MG tablet Take 1 tablet (10 mg total) by mouth daily. For cholesterol 05/01/24   Rilla Baller, MD  budesonide -formoterol  (SYMBICORT ) 80-4.5 MCG/ACT inhaler Inhale 2 puffs into the lungs in the morning and at bedtime. 05/01/24   Rilla Baller, MD  buPROPion  ER (WELLBUTRIN  SR) 100 MG 12 hr tablet Take 1 tablet (100 mg total) by mouth daily. For mood 05/01/24   Rilla Baller, MD  Cholecalciferol (VITAMIN D3) 25 MCG (1000 UT) CAPS Take 1 capsule (1,000 Units total) by mouth daily. 05/01/24   Rilla Baller, MD  cyanocobalamin  (VITAMIN B12) 500 MCG tablet Take 1 tablet (500 mcg total) by mouth every Monday, Wednesday, and Friday. 05/01/24   Rilla Baller, MD  donepezil  (ARICEPT ) 5 MG tablet Take 5 mg by mouth at bedtime. 05/14/24 05/14/25  [provider]  DULoxetine  (CYMBALTA ) 30 MG capsule Take 1 capsule (30 mg total) by mouth daily. For mood 05/01/24   Rilla Baller, MD  lisinopril  (ZESTRIL ) 20 MG tablet Take 1 tablet (20 mg total) by mouth daily. 07/16/24 08/15/24  Dezii, Alexandra, DO  pantoprazole  (PROTONIX ) 40 MG tablet Take 1 tablet (40 mg total) by mouth daily. 07/16/24 08/15/24  Dezii, Alexandra, DO  PROLIA  60 MG/ML SOSY injection Inject into the skin. 12/06/22   [provider]  propranolol  (INDERAL ) 10 MG tablet Take 1 tablet (10 mg total) by mouth 2 (two) times daily. For tremor 05/01/24   Rilla Baller, MD  traZODone  (DESYREL ) 50 MG tablet Take 1 tablet (50 mg total) by mouth at bedtime. 05/01/24   Rilla Baller, MD    Physical Exam: Vitals:   08/31/24 1226 08/31/24 1700 08/31/24 1831 08/31/24 1900  BP: (!) 196/93 (!) 141/74 (!) 128/98 (!) 169/89  Pulse:  96 94 93  Resp:  18 17 15   Temp:   98.8 F (37.1 C) 98.6 F (37 C)  TempSrc:    Oral  SpO2:  98% 99% 100%  Weight:      Height:        Physical  Exam Constitutional:      Comments: Oriented to situation and location, not time. Thin, frail  HENT:     Head: Normocephalic.     Nose: No congestion.     Mouth/Throat:     Mouth: Mucous membranes are moist.     Pharynx: Oropharynx is clear.     Comments: Upper left molar broken at gumline.  Does not appear to be currently infected.  Eyes:     General: No scleral icterus.    Conjunctiva/sclera: Conjunctivae normal.     Pupils: Pupils are equal, round, and reactive to light.     Comments: Bruising of skin under right eye  Cardiovascular:     Rate and Rhythm:  Normal rate and regular rhythm.     Pulses: Normal pulses.  Pulmonary:     Effort: Pulmonary effort is normal.     Breath sounds: Normal breath sounds. No wheezing.  Abdominal:     Palpations: Abdomen is soft.     Tenderness: There is no abdominal tenderness.  Musculoskeletal:        General: Normal range of motion.     Right lower leg: No edema.     Left lower leg: No edema.  Skin:    General: Skin is warm and dry.  Neurological:     General: No focal deficit present.     Mental Status: She is alert.  Psychiatric:        Mood and Affect: Mood normal.      Labs on Admission: I have personally reviewed following labs and imaging studies  CBC: Recent Labs  Lab 08/31/24 1224  WBC 8.8  HGB 12.8  HCT 39.4  MCV 91.8  PLT 247   Basic Metabolic Panel: Recent Labs  Lab 08/31/24 1224 08/31/24 1833  NA 142  --   K 2.9*  --   CL 99  --   CO2 31  --   GLUCOSE 95  --   BUN 10  --   CREATININE 0.70  --   CALCIUM  9.7  --   MG  --  1.9  PHOS  --  3.7   GFR: Estimated Creatinine Clearance: 42.2 mL/min (by C-G formula based on SCr of 0.7 mg/dL). Liver Function Tests: Recent Labs  Lab 08/31/24 1224  AST 11*  ALT 11  ALKPHOS 89  BILITOT 0.7  PROT 6.8  ALBUMIN 4.2   No results for input(s): LIPASE, AMYLASE in the last 168 hours. No results for input(s): AMMONIA in the last 168 hours. Coagulation  Profile: No results for input(s): INR, PROTIME in the last 168 hours. Cardiac Enzymes: No results for input(s): CKTOTAL, CKMB, CKMBINDEX, TROPONINI, TROPONINIHS in the last 168 hours. ProBNP, BNP (last 5 results) No results for input(s): PROBNP, BNP in the last 8760 hours. HbA1C: No results for input(s): HGBA1C in the last 72 hours. CBG: No results for input(s): GLUCAP in the last 168 hours. Lipid Profile: No results for input(s): CHOL, HDL, LDLCALC, TRIG, CHOLHDL, LDLDIRECT in the last 72 hours. Thyroid  Function Tests: No results for input(s): TSH, T4TOTAL, FREET4, T3FREE, THYROIDAB in the last 72 hours. Anemia Panel: No results for input(s): VITAMINB12, FOLATE, FERRITIN, TIBC, IRON , RETICCTPCT in the last 72 hours. Urine analysis:    Component Value Date/Time   COLORURINE AMBER (A) 08/31/2024 1832   APPEARANCEUR CLOUDY (A) 08/31/2024 1832   LABSPEC 1.016 08/31/2024 1832   PHURINE 5.0 08/31/2024 1832   GLUCOSEU NEGATIVE 08/31/2024 1832   GLUCOSEU NEGATIVE 05/24/2022 1547   HGBUR NEGATIVE 08/31/2024 1832   BILIRUBINUR NEGATIVE 08/31/2024 1832   BILIRUBINUR negative 01/09/2024 1238   KETONESUR NEGATIVE 08/31/2024 1832   PROTEINUR NEGATIVE 08/31/2024 1832   UROBILINOGEN 0.2 01/09/2024 1238   UROBILINOGEN 2.0 (A) 05/24/2022 1547   NITRITE POSITIVE (A) 08/31/2024 1832   LEUKOCYTESUR MODERATE (A) 08/31/2024 1832    Radiological Exams on Admission: I have personally reviewed images DG Chest 1 View Result Date: 08/31/2024 EXAM: 1 VIEW(S) XRAY OF THE CHEST 08/31/2024 06:16:00 PM COMPARISON: 11 / 17 / 23 . CLINICAL HISTORY: Right anterior rib pain; cough. FINDINGS: LUNGS AND PLEURA: Hyperinflation and chronic bronchitic change. Right greater than left apical scarring. No focal pulmonary opacity. No pleural effusion. No pneumothorax.  HEART AND MEDIASTINUM: Aortic atherosclerotic calcification. No acute abnormality of the cardiac and  mediastinal silhouettes. BONES AND SOFT TISSUES: No acute osseous abnormality. IMPRESSION: 1. No acute cardiopulmonary abnormality. 2. Emphysema. Electronically signed by: Norman Gatlin MD 08/31/2024 06:23 PM EST RP Workstation: [id number]   CT Head Wo Contrast Result Date: 08/31/2024 CLINICAL DATA:  Altered level of consciousness, possible urinary tract infection EXAM: CT HEAD WITHOUT CONTRAST TECHNIQUE: Contiguous axial images were obtained from the base of the skull through the vertex without intravenous contrast. RADIATION DOSE REDUCTION: This exam was performed according to the departmental dose-optimization program which includes automated exposure control, adjustment of the mA and/or kV according to patient size and/or use of iterative reconstruction technique. COMPARISON:  08/18/2024 FINDINGS: Brain: Stable chronic small-vessel ischemic changes within the periventricular white matter. No evidence of acute infarct or hemorrhage. The lateral ventricles and midline structures are stable. No acute extra-axial fluid collections. No mass effect. Vascular: No hyperdense vessel or unexpected calcification. Skull: Normal. Negative for fracture or focal lesion. Sinuses/Orbits: No acute finding. Other: None. IMPRESSION: 1. Stable head CT, no acute intracranial process. Electronically Signed   By: Ozell Daring M.D.   On: 08/31/2024 17:48    EKG: My personal interpretation of EKG shows: NSR    Assessment/Plan Principal Problem:   AMS (altered mental status) Active Problems:   Hypertension   Generalized weakness   UTI (urinary tract infection)   Hypokalemia    Assessment and Plan: * AMS (altered mental status) Pt confused during exam.  Not oriented to month or year.  Was not able to tell me her age.  Per her partner, Courtney Thomas, this is not her baseline, but confusion has been worse throughout her stay at [facility].   - treating for UTI w/ CTX.   - bupropion , trazodone , duloxetine , donepazil listed on  home meds.  Holding overnight until med rec can be completed.   Hypokalemia Kidney function normal.  Mag level 1.9.   - potassium supplement daily - recheck bmp daily  UTI (urinary tract infection) Confusion, urinary frequency/incontinence (although when family is with pt at snf she is able to make it to the restroom w/o incontinence).  Urinalysis shows likely infection.   - f/u urine culture - continue CTX for uti, transition to po when able.   Generalized weakness Has been at [facility] for approximately 6 weeks.  Has not had significant improvement in ambulation during that time.  Has had multiple falls while at SNF reportedly.   - consulting pt/ot.   - consulting transitions of care - consider placement in new facility if possible.    Hypertension Lisinopril  daily and propranolol  bid (for tremor) noted on home med list.  Pt unable to confirm what medications she is taking.  Bp elevated on admission - restarting home meds       DVT prophylaxis: Lovenox  Code Status: Full Code Family Communication: daughter, Courtney Thomas, is dedicated management consultant Disposition Plan: SNF  Consults called: transitions of care  Admission status: Inpatient, Med-Surg   Toribio MARLA Slain, MD [facility] 08/31/2024, 9:51 PM       [1]  Allergies Allergen Reactions   Egg Protein-Containing Drug Products Anaphylaxis and Swelling   Codeine Nausea Only   Penicillins Rash    .p    "

## 2024-08-31 NOTE — ED Triage Notes (Signed)
 First Nurse Note:  Pt via [facility] from [facility]. Pt c/o AMS, unknown pt's baseline. Reports that pt had cloudy urine. Pt is alert, A&Ox2, pt has a hx of cognitive impairment.   EMS reports: 99.2 axillary, 178/75 BP, 87 CBG, 85 HR, 95% on RA, 14 RR

## 2024-08-31 NOTE — Assessment & Plan Note (Addendum)
 Pt confused during exam.  Not oriented to month or year.  Was not able to tell me her age.  Per her partner, steven, this is not her baseline, but confusion has been worse throughout her stay at [facility].   - treating for UTI w/ CTX.   - bupropion , trazodone , duloxetine , donepazil listed on home meds.  Holding overnight until med rec can be completed.

## 2024-08-31 NOTE — Assessment & Plan Note (Signed)
 Confusion, urinary frequency/incontinence (although when family is with pt at snf she is able to make it to the restroom w/o incontinence).  Urinalysis shows likely infection.   - f/u urine culture - continue CTX for uti, transition to po when able.

## 2024-08-31 NOTE — Assessment & Plan Note (Addendum)
 Lisinopril  daily and propranolol  bid (for tremor) noted on home med list.  Pt unable to confirm what medications she is taking.  Bp elevated on admission - restarting home meds

## 2024-09-01 LAB — BLOOD GAS, VENOUS
Acid-Base Excess: 3.4 mmol/L — ABNORMAL HIGH (ref 0.0–2.0)
Bicarbonate: 29.6 mmol/L — ABNORMAL HIGH (ref 20.0–28.0)
O2 Saturation: 95.4 %
Patient temperature: 37
pCO2, Ven: 50 mmHg (ref 44–60)
pH, Ven: 7.38 (ref 7.25–7.43)
pO2, Ven: 67 mmHg — ABNORMAL HIGH (ref 32–45)

## 2024-09-01 LAB — CBC
HCT: 34.7 % — ABNORMAL LOW (ref 36.0–46.0)
Hemoglobin: 11.4 g/dL — ABNORMAL LOW (ref 12.0–15.0)
MCH: 30.6 pg (ref 26.0–34.0)
MCHC: 32.9 g/dL (ref 30.0–36.0)
MCV: 93 fL (ref 80.0–100.0)
Platelets: 193 10*3/uL (ref 150–400)
RBC: 3.73 MIL/uL — ABNORMAL LOW (ref 3.87–5.11)
RDW: 14 % (ref 11.5–15.5)
WBC: 8.2 10*3/uL (ref 4.0–10.5)
nRBC: 0 % (ref 0.0–0.2)

## 2024-09-01 LAB — BASIC METABOLIC PANEL WITH GFR
Anion gap: 10 (ref 5–15)
BUN: 8 mg/dL (ref 8–23)
CO2: 26 mmol/L (ref 22–32)
Calcium: 8.3 mg/dL — ABNORMAL LOW (ref 8.9–10.3)
Chloride: 104 mmol/L (ref 98–111)
Creatinine, Ser: 0.57 mg/dL (ref 0.44–1.00)
GFR, Estimated: >60 mL/min
Glucose, Bld: 85 mg/dL (ref 70–99)
Potassium: 2.9 mmol/L — ABNORMAL LOW (ref 3.5–5.1)
Sodium: 140 mmol/L (ref 135–145)

## 2024-09-01 LAB — AMMONIA: Ammonia: 30 umol/L (ref 9–35)

## 2024-09-01 LAB — POTASSIUM: Potassium: 3.7 mmol/L (ref 3.5–5.1)

## 2024-09-01 LAB — TSH: TSH: 0.822 u[IU]/mL (ref 0.350–4.500)

## 2024-09-01 MED ORDER — ATORVASTATIN CALCIUM 20 MG PO TABS
10.0000 mg | ORAL_TABLET | Freq: Every day | ORAL | Status: AC
  Administered 2024-09-01 – 2024-09-06 (×6): 10 mg via ORAL
  Filled 2024-09-01 (×6): qty 1

## 2024-09-01 MED ORDER — POTASSIUM CHLORIDE CRYS ER 20 MEQ PO TBCR
40.0000 meq | EXTENDED_RELEASE_TABLET | ORAL | Status: AC
  Administered 2024-09-01 (×2): 40 meq via ORAL
  Filled 2024-09-01 (×2): qty 2

## 2024-09-01 NOTE — Progress Notes (Signed)
 " PROGRESS NOTE    Courtney Thomas  FMW:[id number] DOB: April 10, 1946 DOA: 08/31/2024 PCP: Rilla Baller, MD  Chief Complaint  Patient presents with   Altered Mental Status   possible UTI    Hospital Course:  Ms. Olarte is an elderly female brought to the ED from the skilled nursing facility (SNF) [facility], where she has resided for approximately one month after losing the ability to walk following a dental abscess that developed the day after Thanksgiving, according to her partner, steve walton.  Patient has been weak and wheelchair-bound since then.  Reported falls at SNF, with bruise to the eye noted.  Patient presented to the ER due to being sick with generalized weakness, minimal p.o. intake and increased frequency of voiding and urgency.  Admitted for acute encephalopathy, possible UTI Family: Daughter (in [place]) designated healthcare power of attorney  Subjective: Was examined at the bedside, new to me today. Denies any complaints at the bedside, reports generalized weakness Will continue IV antibiotics Called daughter Lucie, left voicemail   Objective: Vitals:   08/31/24 2200 08/31/24 2310 09/01/24 0300 09/01/24 0500  BP: (!) 165/140 (!) 184/68 (!) 127/110 (!) 189/56  Pulse:  92 67 64  Resp: (!) 22 20 19 20   Temp:  98.2 F (36.8 C) 98 F (36.7 C) 98.2 F (36.8 C)  TempSrc:  Oral Oral Oral  SpO2:  97% 99% 100%  Weight:      Height:        Intake/Output Summary (Last 24 hours) at 09/01/2024 0834 Last data filed at 08/31/2024 2319 Gross per 24 hour  Intake 1400 ml  Output --  Net 1400 ml   Filed Weights   08/31/24 1223  Weight: 45.4 kg    Examination: Constitutional:      Comments: Oriented to situation and location, not time. Thin, frail  HENT:     Comments: Upper left molar broken at gumline.  Does not appear to be currently infected.  Eyes:     Comments: Bruising of skin under right eye  Cardiovascular:     Rate and Rhythm: Normal rate and  regular rhythm.     Pulses: Normal pulses.  Pulmonary:     Effort: Pulmonary effort is normal.     Breath sounds: Normal breath sounds. No wheezing.  Abdominal:     Palpations: Abdomen is soft.     Tenderness: There is no abdominal tenderness.  Musculoskeletal:        General: Normal range of motion.     Right lower leg: No edema.     Left lower leg: No edema.  Skin:    General: Skin is warm and dry.  Neurological:     General: Pleasantly confused, no focal deficit present.     Mental Status: She is alert.   Assessment & Plan:  AMS (altered mental status) Pt confused during exam, is AO x 2.  Not oriented to month or year.  Was not able to tell me her age Denies any complaints except generalized weakness CT head with no acute changes UA ?  UTI, CXR negative for pneumonia Send TSH, B12, ammonia, VBG, blood cultures On ceftriaxone  for possible UTI Hold bupropion , trazodone , duloxetine , donepezil    Hypokalemia Kidney function normal.  Mag level 1.9.   Monitor and replete as needed   UTI (urinary tract infection) No leukocytosis, UA + moderate leukocyte esterase, positive nitrites, few bacteria, 0-5 WBC ?  Suspect if she really has a true infection Has AMS, urinary  frequency/urgency Started on ceftriaxone  per admitting team Follow-up urine culture, blood culture   Generalized weakness Has been at [facility] for approximately 6 weeks.  Has not had significant improvement in ambulation during that time.  Has had multiple falls while at SNF reportedly Check AM cortisol - 8 AM RD consult, PT/OT eval  COPD, not in exacerbation Home inhalers   Hypertension Lisinopril  and propranolol  bid (for tremor)   DVT prophylaxis: Lovenox  SQ   Code Status: Full Code Disposition:  TBD  Consultants:  None  Procedures:  None  Antimicrobials:  Anti-infectives (From admission, onward)    Start     Dose/Rate Route Frequency Ordered Stop   09/01/24 2200  cefTRIAXone  (ROCEPHIN ) 1 g in sodium  chloride 0.9 % 100 mL IVPB        1 g 200 mL/hr over 30 Minutes Intravenous Every 24 hours 08/31/24 2119     08/31/24 1945  cefTRIAXone  (ROCEPHIN ) 1 g in sodium chloride  0.9 % 100 mL IVPB        1 g 200 mL/hr over 30 Minutes Intravenous  Once 08/31/24 1944 08/31/24 2027       Data Reviewed: I have personally reviewed following labs and imaging studies CBC: Recent Labs  Lab 08/31/24 1224 09/01/24 0505  WBC 8.8 8.2  HGB 12.8 11.4*  HCT 39.4 34.7*  MCV 91.8 93.0  PLT 247 193   Basic Metabolic Panel: Recent Labs  Lab 08/31/24 1224 08/31/24 1833 09/01/24 0505  NA 142  --  140  K 2.9*  --  2.9*  CL 99  --  104  CO2 31  --  26  GLUCOSE 95  --  85  BUN 10  --  8  CREATININE 0.70  --  0.57  CALCIUM  9.7  --  8.3*  MG  --  1.9  --   PHOS  --  3.7  --    GFR: Estimated Creatinine Clearance: 42.2 mL/min (by C-G formula based on SCr of 0.57 mg/dL). Liver Function Tests: Recent Labs  Lab 08/31/24 1224  AST 11*  ALT 11  ALKPHOS 89  BILITOT 0.7  PROT 6.8  ALBUMIN 4.2   CBG: No results for input(s): GLUCAP in the last 168 hours.  No results found for this or any previous visit (from the past 240 hours).   Radiology Studies: DG Chest 1 View Result Date: 08/31/2024 EXAM: 1 VIEW(S) XRAY OF THE CHEST 08/31/2024 06:16:00 PM COMPARISON: 11 / 17 / 23 . CLINICAL HISTORY: Right anterior rib pain; cough. FINDINGS: LUNGS AND PLEURA: Hyperinflation and chronic bronchitic change. Right greater than left apical scarring. No focal pulmonary opacity. No pleural effusion. No pneumothorax. HEART AND MEDIASTINUM: Aortic atherosclerotic calcification. No acute abnormality of the cardiac and mediastinal silhouettes. BONES AND SOFT TISSUES: No acute osseous abnormality. IMPRESSION: 1. No acute cardiopulmonary abnormality. 2. Emphysema. Electronically signed by: Norman Gatlin MD 08/31/2024 06:23 PM EST RP Workstation: [id number]   CT Head Wo Contrast Result Date: 08/31/2024 CLINICAL DATA:   Altered level of consciousness, possible urinary tract infection EXAM: CT HEAD WITHOUT CONTRAST TECHNIQUE: Contiguous axial images were obtained from the base of the skull through the vertex without intravenous contrast. RADIATION DOSE REDUCTION: This exam was performed according to the departmental dose-optimization program which includes automated exposure control, adjustment of the mA and/or kV according to patient size and/or use of iterative reconstruction technique. COMPARISON:  08/18/2024 FINDINGS: Brain: Stable chronic small-vessel ischemic changes within the periventricular white matter. No evidence of acute infarct or  hemorrhage. The lateral ventricles and midline structures are stable. No acute extra-axial fluid collections. No mass effect. Vascular: No hyperdense vessel or unexpected calcification. Skull: Normal. Negative for fracture or focal lesion. Sinuses/Orbits: No acute finding. Other: None. IMPRESSION: 1. Stable head CT, no acute intracranial process. Electronically Signed   By: Ozell Daring M.D.   On: 08/31/2024 17:48    Scheduled Meds:  denosumab   60 mg Subcutaneous Once   denosumab   60 mg Subcutaneous Once   enoxaparin  (LOVENOX ) injection  40 mg Subcutaneous Q24H   fluticasone  furoate-vilanterol  1 puff Inhalation Daily   lisinopril   20 mg Oral Daily   pantoprazole   40 mg Oral QHS   potassium chloride   40 mEq Oral Daily   propranolol   10 mg Oral BID   Continuous Infusions:  cefTRIAXone  (ROCEPHIN )  IV       LOS: 1 day  MDM: Patient is high risk for one or more organ failure.  They necessitate ongoing hospitalization for continued IV therapies and subsequent lab monitoring. Total time spent interpreting labs and vitals, reviewing the medical record, coordinating care amongst consultants and care team members, directly assessing and discussing care with the patient and/or family: 55 min Laree Lock, MD [facility]  To contact the attending physician between 7A-7P  please use Epic Chat. To contact the covering physician during after hours 7P-7A, please review Amion.  09/01/2024, 8:34 AM   *This document has been created with the assistance of dictation software. Please excuse typographical errors. *   "

## 2024-09-01 NOTE — ED Notes (Signed)
 Pt soiled brief and bed. Bed and pt cleaned, new brief applied.

## 2024-09-02 DIAGNOSIS — R410 Disorientation, unspecified: Secondary | ICD-10-CM | POA: Diagnosis not present

## 2024-09-02 LAB — VITAMIN B12: Vitamin B-12: 426 pg/mL (ref 180–914)

## 2024-09-02 LAB — CBC
HCT: 38.9 % (ref 36.0–46.0)
Hemoglobin: 12.8 g/dL (ref 12.0–15.0)
MCH: 30.6 pg (ref 26.0–34.0)
MCHC: 32.9 g/dL (ref 30.0–36.0)
MCV: 93.1 fL (ref 80.0–100.0)
Platelets: 100 10*3/uL — ABNORMAL LOW (ref 150–400)
RBC: 4.18 MIL/uL (ref 3.87–5.11)
RDW: 14 % (ref 11.5–15.5)
WBC: 6.4 10*3/uL (ref 4.0–10.5)
nRBC: 0 % (ref 0.0–0.2)

## 2024-09-02 LAB — BASIC METABOLIC PANEL WITH GFR
Anion gap: 15 (ref 5–15)
BUN: 10 mg/dL (ref 8–23)
CO2: 24 mmol/L (ref 22–32)
Calcium: 9.2 mg/dL (ref 8.9–10.3)
Chloride: 100 mmol/L (ref 98–111)
Creatinine, Ser: 0.73 mg/dL (ref 0.44–1.00)
GFR, Estimated: >60 mL/min
Glucose, Bld: 88 mg/dL (ref 70–99)
Potassium: 3.3 mmol/L — ABNORMAL LOW (ref 3.5–5.1)
Sodium: 138 mmol/L (ref 135–145)

## 2024-09-02 LAB — MAGNESIUM: Magnesium: 2 mg/dL (ref 1.7–2.4)

## 2024-09-02 LAB — CORTISOL-AM, BLOOD: Cortisol - AM: 15.1 ug/dL (ref 6.7–22.6)

## 2024-09-02 MED ORDER — POTASSIUM CHLORIDE CRYS ER 20 MEQ PO TBCR
40.0000 meq | EXTENDED_RELEASE_TABLET | ORAL | Status: AC
  Administered 2024-09-02 (×2): 40 meq via ORAL
  Filled 2024-09-02: qty 2

## 2024-09-02 NOTE — Evaluation (Signed)
 Occupational Therapy Evaluation Patient Details Name: Courtney Thomas MRN: [id number] DOB: 10-31-1945 Today's Date: 09/02/2024   History of Present Illness   ZORANA BROCKWELL is a 79 y.o. female with a history of HTN, HLD, osteoporosis, adjustment disorder with depression, and asthma, presents to the ED accompanied by her husband.  Patient is currently at [facility], for rehab due to mobility and gait issues. Presents with increased weakness and AMS     Clinical Impressions Patient was seen for OT evaluation this date. Prior to hospital admission, patient was residing at [facility], spouse reports she has been declining with her mobility/cognition ever since. During OT eval, patient requird max A for incontinence care (see below for level of assist scores). OT facilitated patient sitting EOB, she required max A to maintain EOB position and requested to return to bed. Noted patients IV had been pulled out prior to OT start, RN notified. OT repositioned patient in gurney to sit up to eat breakfast. Spouse present at this time and agreeable to assist with meal.  Patient presents with deficits in activity tolerance, attention to task and safety awareness, affecting safe and optimal ADL completion. Patient is currently requiring max A for ADLs.  Paient would benefit from skilled OT services to address noted impairments and functional limitations (see below for any additional details) in order to maximize safety and independence while minimizing future risk of falls, injury, and readmission. Anticipate the need for follow up OT services upon acute hospital DC.      If plan is discharge home, recommend the following:   Two people to help with walking and/or transfers;A lot of help with bathing/dressing/bathroom;Two people to help with bathing/dressing/bathroom;Direct supervision/assist for financial management;Direct supervision/assist for medications management;Assistance with  feeding;Supervision due to cognitive status;Help with stairs or ramp for entrance     Functional Status Assessment   Patient has had a recent decline in their functional status and demonstrates the ability to make significant improvements in function in a reasonable and predictable amount of time.     Equipment Recommendations   None recommended by OT     Recommendations for Other Services         Precautions/Restrictions   Precautions Precautions: Fall Recall of Precautions/Restrictions: Impaired Restrictions Weight Bearing Restrictions Per Provider Order: No     Mobility Bed Mobility Overal bed mobility: Needs Assistance Bed Mobility: Rolling, Supine to Sit, Sit to Supine Rolling: Mod assist   Supine to sit: Max assist Sit to supine: Max assist        Transfers                   General transfer comment: deferred      Balance Overall balance assessment: Needs assistance Sitting-balance support: Feet unsupported, Bilateral upper extremity supported Sitting balance-Leahy Scale: Poor                                     ADL either performed or assessed with clinical judgement   ADL Overall ADL's : Needs assistance/impaired Eating/Feeding: Set up                   Lower Body Dressing: Maximal assistance       Toileting- Clothing Manipulation and Hygiene: Maximal assistance         General ADL Comments: patient able to bridge in bed for incontinence care, unable to tolerate sitting EOB with  max A, required max A for incontinence care and LB dressing at bed level     Vision         Perception         Praxis         Pertinent Vitals/Pain Pain Assessment Pain Assessment: Faces Faces Pain Scale: No hurt     Extremity/Trunk Assessment Upper Extremity Assessment Upper Extremity Assessment: Generalized weakness   Lower Extremity Assessment Lower Extremity Assessment: Defer to PT evaluation        Communication Communication Communication: Impaired Factors Affecting Communication: Reduced clarity of speech   Cognition Arousal: Alert Behavior During Therapy: Restless, WFL for tasks assessed/performed Cognition: Cognition impaired, History of cognitive impairments   Orientation impairments: Time, Situation Awareness: Intellectual awareness impaired   Attention impairment (select first level of impairment): Sustained attention                     Following commands: Impaired Following commands impaired: Follows one step commands inconsistently     Cueing  General Comments   Cueing Techniques: Verbal cues;Tactile cues      Exercises     Shoulder Instructions      Home Living Family/patient expects to be discharged to:: Skilled nursing facility                                        Prior Functioning/Environment Prior Level of Function : Needs assist             Mobility Comments: w/c bound since thanksgiving ADLs Comments: requires A from SNF staff    OT Problem List: Decreased strength;Decreased activity tolerance;Impaired balance (sitting and/or standing);Decreased cognition;Decreased safety awareness;Decreased knowledge of use of DME or AE   OT Treatment/Interventions: Self-care/ADL training;Therapeutic exercise;Energy conservation;DME and/or AE instruction;Therapeutic activities;Cognitive remediation/compensation      OT Goals(Current goals can be found in the care plan section)   Acute Rehab OT Goals Patient Stated Goal: to move OT Goal Formulation: With patient/family Time For Goal Achievement: 09/16/24 Potential to Achieve Goals: Fair ADL Goals Pt Will Perform Grooming: with min assist;sitting Pt Will Perform Upper Body Dressing: with min assist;sitting Pt Will Transfer to Toilet: with mod assist;stand pivot transfer;regular height toilet;bedside commode Pt Will Perform Toileting - Clothing Manipulation and hygiene:  with min assist;sit to/from stand   OT Frequency:  Min 2X/week    Co-evaluation              AM-PAC OT 6 Clicks Daily Activity     Outcome Measure Help from another person eating meals?: A Little Help from another person taking care of personal grooming?: A Little Help from another person toileting, which includes using toliet, bedpan, or urinal?: A Lot Help from another person bathing (including washing, rinsing, drying)?: A Lot Help from another person to put on and taking off regular upper body clothing?: A Lot Help from another person to put on and taking off regular lower body clothing?: Total 6 Click Score: 13   End of Session Nurse Communication: Other (comment) (IV had come out)  Activity Tolerance: Patient limited by fatigue Patient left: in bed;with family/visitor present  OT Visit Diagnosis: Unsteadiness on feet (R26.81);Repeated falls (R29.6);Other abnormalities of gait and mobility (R26.89);Muscle weakness (generalized) (M62.81);History of falling (Z91.81)                Time: 9046-8984 OT Time Calculation (min): 22 min  Charges:  OT General Charges $OT Visit: 1 Visit OT Evaluation $OT Eval Low Complexity: 1 Low  Rogers Clause, OT/L MSOT, 09/02/2024

## 2024-09-02 NOTE — ED Notes (Signed)
 Writer and Engineer, Site assisted pt with brief and gown change. After this pt was assisted to hospital bed and then moved into room 30. Writer wiped pt body down at this time.

## 2024-09-02 NOTE — ED Notes (Signed)
Pt cleaned of urinary incontinence.

## 2024-09-02 NOTE — Progress Notes (Addendum)
 "    Progress Note    Courtney Thomas  FMW:[id number] DOB: [birth year]  DOA: 08/31/2024 PCP: Rilla Baller, MD      Brief Narrative:    Medical records reviewed and are as summarized below:  Courtney Thomas is a 79 y.o. female  brought to the ED from the skilled nursing facility (SNF) [facility], where she has resided for approximately one month after losing the ability to walk following a dental abscess that developed the day after Thanksgiving, according to her partner, steve walton.  She has been weak and wheelchair-bound since then.  She has had some falls at the nursing facility.  She was brought to the hospital because of general weakness, poor oral intake, increased frequency of micturition and urgency of micturition.  She was admitted to the hospital for acute UTI and acute metabolic encephalopathy     Assessment/Plan:   Principal Problem:   AMS (altered mental status) Active Problems:   Hypertension   Generalized weakness   UTI (urinary tract infection)   Hypokalemia    Body mass index is 19.53 kg/m.   Acute E. coli UTI: Urine culture growing E. coli.  Follow-up urine culture sensitivity report.  No growth on blood cultures.  Continue IV ceftriaxone .   Acute metabolic encephalopathy: Is likely from acute UTI.  Continue supportive care.   Hypokalemia: Replete potassium and monitor levels.  Check magnesium  and replete as needed.   General weakness: PT and OT evaluation Cortisol level normal white 15.1.   Comorbidities include hypertension, COPD   Plan discussed with Lucie, daughter, over the phone.  She is hoping that patient can get into another skilled nursing facility.  She said that she thinks the environment at [facility] is not great for her and that could be contributing to her confusion as well.    Diet Order             Diet regular Room service appropriate? Yes; Fluid consistency: Thin  Diet effective now                                   Consultants: None  Procedures: None    Medications:    atorvastatin   10 mg Oral Daily   denosumab   60 mg Subcutaneous Once   denosumab   60 mg Subcutaneous Once   enoxaparin  (LOVENOX ) injection  40 mg Subcutaneous Q24H   fluticasone  furoate-vilanterol  1 puff Inhalation Daily   lisinopril   20 mg Oral Daily   pantoprazole   40 mg Oral QHS   propranolol   10 mg Oral BID   Continuous Infusions:  cefTRIAXone  (ROCEPHIN )  IV Stopped (09/01/24 2232)     Anti-infectives (From admission, onward)    Start     Dose/Rate Route Frequency Ordered Stop   09/01/24 2200  cefTRIAXone  (ROCEPHIN ) 1 g in sodium chloride  0.9 % 100 mL IVPB        1 g 200 mL/hr over 30 Minutes Intravenous Every 24 hours 08/31/24 2119     08/31/24 1945  cefTRIAXone  (ROCEPHIN ) 1 g in sodium chloride  0.9 % 100 mL IVPB        1 g 200 mL/hr over 30 Minutes Intravenous  Once 08/31/24 1944 08/31/24 2027              Family Communication/Anticipated D/C date and plan/Code Status   DVT prophylaxis: enoxaparin  (LOVENOX ) injection 40 mg Start: 08/31/24 2200  Code Status: Full Code  Family Communication: Plan discussed with Lucie, daughter, over the phone Disposition Plan: Plan to discharge to SNF   Status is: Inpatient Remains inpatient appropriate because: Acute UTI, confusion       Subjective:   Interval events noted.  She is confused and cannot provide an adequate history.  She does not report any complaints at this time.  Objective:    Vitals:   09/01/24 2141 09/02/24 0129 09/02/24 0437 09/02/24 0919  BP: (!) 141/94 (!) 163/68 124/75 (!) 191/61  Pulse: 76 62 66 67  Resp: 16 16 18 16   Temp: 98.4 F (36.9 C) 98.3 F (36.8 C) 98 F (36.7 C) 98.1 F (36.7 C)  TempSrc: Oral  Oral Oral  SpO2: 93% 93% 94% 98%  Weight:      Height:       No data found.  No intake or output data in the 24 hours ending 09/02/24 1236 Filed Weights   08/31/24  1223  Weight: 45.4 kg    Exam:  GEN: NAD SKIN: Warm and dry EYES: No pallor or icterus ENT: MMM CV: RRR PULM: CTA B ABD: soft, ND, NT, +BS CNS: AAO x 1 (person), non focal EXT: No edema or tenderness        Data Reviewed:   I have personally reviewed following labs and imaging studies:  Labs: Labs show the following:   Basic Metabolic Panel: Recent Labs  Lab 08/31/24 1224 08/31/24 1833 09/01/24 0505 09/01/24 1651 09/02/24 0407  NA 142  --  140  --  138  K 2.9*  --  2.9* 3.7 3.3*  CL 99  --  104  --  100  CO2 31  --  26  --  24  GLUCOSE 95  --  85  --  88  BUN 10  --  8  --  10  CREATININE 0.70  --  0.57  --  0.73  CALCIUM  9.7  --  8.3*  --  9.2  MG  --  1.9  --   --   --   PHOS  --  3.7  --   --   --    GFR Estimated Creatinine Clearance: 42.2 mL/min (by C-G formula based on SCr of 0.73 mg/dL). Liver Function Tests: Recent Labs  Lab 08/31/24 1224  AST 11*  ALT 11  ALKPHOS 89  BILITOT 0.7  PROT 6.8  ALBUMIN 4.2   No results for input(s): LIPASE, AMYLASE in the last 168 hours. Recent Labs  Lab 09/01/24 1150  AMMONIA 30   Coagulation profile No results for input(s): INR, PROTIME in the last 168 hours.  CBC: Recent Labs  Lab 08/31/24 1224 09/01/24 0505 09/02/24 0407  WBC 8.8 8.2 6.4  HGB 12.8 11.4* 12.8  HCT 39.4 34.7* 38.9  MCV 91.8 93.0 93.1  PLT 247 193 100*   Cardiac Enzymes: No results for input(s): CKTOTAL, CKMB, CKMBINDEX, TROPONINI in the last 168 hours. BNP (last 3 results) No results for input(s): PROBNP in the last 8760 hours. CBG: No results for input(s): GLUCAP in the last 168 hours. D-Dimer: No results for input(s): DDIMER in the last 72 hours. Hgb A1c: No results for input(s): HGBA1C in the last 72 hours. Lipid Profile: No results for input(s): CHOL, HDL, LDLCALC, TRIG, CHOLHDL, LDLDIRECT in the last 72 hours. Thyroid  function studies: Recent Labs    09/01/24 0505  TSH 0.822    Anemia work up: No results for input(s): VITAMINB12, FOLATE,  FERRITIN, TIBC, IRON , RETICCTPCT in the last 72 hours. Sepsis Labs: Recent Labs  Lab 08/31/24 1224 09/01/24 0505 09/02/24 0407  WBC 8.8 8.2 6.4    Microbiology Recent Results (from the past 240 hours)  Urine Culture (for pregnant, neutropenic or urologic patients or patients with an indwelling urinary catheter)     Status: Abnormal (Preliminary result)   Collection Time: 08/31/24  6:32 PM   Specimen: Urine, Random  Result Value Ref Range Status   Specimen Description   Final    URINE, RANDOM Performed at [facility], [address] [place], [place] [address]    Special Requests   Final    NONE Performed at [facility], [address], [place], [place] [address]    Culture (A)  Final    >=100,000 COLONIES/mL ESCHERICHIA COLI SUSCEPTIBILITIES TO FOLLOW Performed at [facility], [address] [place], [place] [address]    Report Status PENDING  Incomplete  CULTURE, BLOOD (ROUTINE X 2) w Reflex to ID Panel     Status: None (Preliminary result)   Collection Time: 09/01/24  8:53 AM   Specimen: BLOOD  Result Value Ref Range Status   Specimen Description   Final    BLOOD Blood Culture results may not be optimal due to an inadequate volume of blood received in culture bottles   Special Requests   Final    BOTTLES DRAWN AEROBIC AND ANAEROBIC RIGHT ANTECUBITAL   Culture   Final    NO GROWTH < 24 HOURS Performed at [facility], [address] [place], [place] [address]    Report Status PENDING  Incomplete    Procedures and diagnostic studies:  DG Chest 1 View Result Date: 08/31/2024 EXAM: 1 VIEW(S) XRAY OF THE CHEST 08/31/2024 06:16:00 PM COMPARISON: 11 / 17 / 23 . CLINICAL HISTORY: Right anterior rib pain; cough. FINDINGS: LUNGS AND PLEURA: Hyperinflation and chronic bronchitic change. Right greater than left apical scarring. No focal pulmonary opacity. No  pleural effusion. No pneumothorax. HEART AND MEDIASTINUM: Aortic atherosclerotic calcification. No acute abnormality of the cardiac and mediastinal silhouettes. BONES AND SOFT TISSUES: No acute osseous abnormality. IMPRESSION: 1. No acute cardiopulmonary abnormality. 2. Emphysema. Electronically signed by: Norman Gatlin MD 08/31/2024 06:23 PM EST RP Workstation: [id number]   CT Head Wo Contrast Result Date: 08/31/2024 CLINICAL DATA:  Altered level of consciousness, possible urinary tract infection EXAM: CT HEAD WITHOUT CONTRAST TECHNIQUE: Contiguous axial images were obtained from the base of the skull through the vertex without intravenous contrast. RADIATION DOSE REDUCTION: This exam was performed according to the departmental dose-optimization program which includes automated exposure control, adjustment of the mA and/or kV according to patient size and/or use of iterative reconstruction technique. COMPARISON:  08/18/2024 FINDINGS: Brain: Stable chronic small-vessel ischemic changes within the periventricular white matter. No evidence of acute infarct or hemorrhage. The lateral ventricles and midline structures are stable. No acute extra-axial fluid collections. No mass effect. Vascular: No hyperdense vessel or unexpected calcification. Skull: Normal. Negative for fracture or focal lesion. Sinuses/Orbits: No acute finding. Other: None. IMPRESSION: 1. Stable head CT, no acute intracranial process. Electronically Signed   By: Ozell Daring M.D.   On: 08/31/2024 17:48               LOS: 2 days   Ameirah Khatoon  [facility]   Pager on [email or web address]. If 7PM-7AM, please contact night-coverage at [email or web address]     09/02/2024, 12:36 PM           "

## 2024-09-03 DIAGNOSIS — R410 Disorientation, unspecified: Secondary | ICD-10-CM | POA: Diagnosis not present

## 2024-09-03 LAB — RENAL FUNCTION PANEL
Albumin: 3.5 g/dL (ref 3.5–5.0)
Anion gap: 11 (ref 5–15)
BUN: 7 mg/dL — ABNORMAL LOW (ref 8–23)
CO2: 27 mmol/L (ref 22–32)
Calcium: 9 mg/dL (ref 8.9–10.3)
Chloride: 100 mmol/L (ref 98–111)
Creatinine, Ser: 0.65 mg/dL (ref 0.44–1.00)
GFR, Estimated: >60 mL/min
Glucose, Bld: 98 mg/dL (ref 70–99)
Phosphorus: 3.9 mg/dL (ref 2.5–4.6)
Potassium: 3 mmol/L — ABNORMAL LOW (ref 3.5–5.1)
Sodium: 139 mmol/L (ref 135–145)

## 2024-09-03 LAB — URINE CULTURE: Culture: >=100000 — AB

## 2024-09-03 LAB — MRSA NEXT GEN BY PCR, NASAL: MRSA by PCR Next Gen: NOT DETECTED

## 2024-09-03 MED ORDER — AMLODIPINE BESYLATE 5 MG PO TABS
5.0000 mg | ORAL_TABLET | Freq: Every day | ORAL | Status: AC
  Administered 2024-09-03 – 2024-09-06 (×4): 5 mg via ORAL
  Filled 2024-09-03 (×4): qty 1

## 2024-09-03 MED ORDER — POTASSIUM CHLORIDE CRYS ER 20 MEQ PO TBCR
40.0000 meq | EXTENDED_RELEASE_TABLET | Freq: Three times a day (TID) | ORAL | Status: AC
  Administered 2024-09-03 (×3): 40 meq via ORAL
  Filled 2024-09-03 (×3): qty 2

## 2024-09-03 MED ORDER — ENSURE PLUS HIGH PROTEIN PO LIQD
237.0000 mL | Freq: Two times a day (BID) | ORAL | Status: AC
  Administered 2024-09-03 – 2024-09-06 (×6): 237 mL via ORAL

## 2024-09-03 MED ORDER — POTASSIUM CHLORIDE CRYS ER 10 MEQ PO TBCR
10.0000 meq | EXTENDED_RELEASE_TABLET | Freq: Every day | ORAL | Status: AC
  Administered 2024-09-04 – 2024-09-06 (×3): 10 meq via ORAL
  Filled 2024-09-03 (×3): qty 1

## 2024-09-03 MED ORDER — CEPHALEXIN 500 MG PO CAPS
500.0000 mg | ORAL_CAPSULE | Freq: Three times a day (TID) | ORAL | Status: AC
  Administered 2024-09-03 – 2024-09-05 (×6): 500 mg via ORAL
  Filled 2024-09-03 (×6): qty 1

## 2024-09-03 NOTE — Plan of Care (Signed)

## 2024-09-03 NOTE — Plan of Care (Signed)
  Problem: Pain Managment: Goal: General experience of comfort will improve and/or be controlled Outcome: Progressing   Problem: Safety: Goal: Ability to remain free from injury will improve Outcome: Progressing

## 2024-09-03 NOTE — Progress Notes (Signed)
 Initial Nutrition Assessment  DOCUMENTATION CODES:   Not applicable  INTERVENTION:  Ensure Plus High Protein po BID, each supplement provides 350 kcal and 20 grams of protein.  Magic cup TID with meals, each supplement provides 290 kcal and 9 grams of protein  Encourage adequate intake of meals and supplements to meet calorie and protein needs for recovery  NUTRITION DIAGNOSIS:   Increased nutrient needs related to acute illness as evidenced by estimated needs.  GOAL:   Patient will meet greater than or equal to 90% of their needs  MONITOR:   PO intake, Supplement acceptance  REASON FOR ASSESSMENT:   Consult Assessment of nutrition requirement/status  ASSESSMENT:   Pt with hx of HTN, HLD, asthma, COPD, and osteoporosis. Multiple recent falls reported from SNF, pt admitted with AMS and UTI.  Pt has been mostly wheelchair bound since November. Pt admitted from SNF and will likely be discharged to SNF. Pt on antibiotic regimen and electrolyte abnormalities corrected with repletion.   RD working remotely at time of assessment. Unable to reach pt via room phone. Information obtained from chart review and care team discussions.   Per chart review, pt has lost 8% weight in 3 months which is clinically significant for the timeframe. Suspect some weight loss may be due to atrophy as pt lost ability to walk and became wheelchair bound around November. However, suspect some level of malnutrition may also be contributing factor. Unable to conduct nutrition focused exam at this time.  Pt on regular diet, recommend encouraging intake at all meals. No meal intake recorded in diet summary documentation, reached out to RN to discuss current intake. Recommend adding Ensure BID to supplement intake to meet calorie and protein needs.  Average Meal Completion: No intake documnetation  Nutritionally Relevant Medications: Protonix  Potassium chloride  40 mEq TID  Labs reviewed: Potassium  3.0  Admit weight: 45.4 kg   NUTRITION - FOCUSED PHYSICAL EXAM: Deferred to follow up  Diet Order:   Diet Order             Diet regular Room service appropriate? Yes; Fluid consistency: Thin  Diet effective now                   EDUCATION NEEDS:   Not appropriate for education at this time  Skin:  Skin Assessment: Reviewed RN Assessment  Last BM:  2/1  Height:   Ht Readings from Last 1 Encounters:  08/31/24 5' (1.524 m)    Weight:   Wt Readings from Last 1 Encounters:  08/31/24 45.4 kg    BMI:  Body mass index is 19.53 kg/m.  Estimated Nutritional Needs:   Kcal:  1300-1500  Protein:  55-65g  Fluid:  1.3-1.5L    Josette Glance, MS, RDN, LDN Clinical Dietitian I Please reach out via secure chat

## 2024-09-04 LAB — CBC
HCT: 39.5 % (ref 36.0–46.0)
Hemoglobin: 13.2 g/dL (ref 12.0–15.0)
MCH: 30.3 pg (ref 26.0–34.0)
MCHC: 33.4 g/dL (ref 30.0–36.0)
MCV: 90.8 fL (ref 80.0–100.0)
Platelets: 265 10*3/uL (ref 150–400)
RBC: 4.35 MIL/uL (ref 3.87–5.11)
RDW: 13.9 % (ref 11.5–15.5)
WBC: 11.9 10*3/uL — ABNORMAL HIGH (ref 4.0–10.5)
nRBC: 0 % (ref 0.0–0.2)

## 2024-09-04 LAB — POTASSIUM: Potassium: 4.7 mmol/L (ref 3.5–5.1)

## 2024-09-04 NOTE — Plan of Care (Signed)

## 2024-09-04 NOTE — Progress Notes (Signed)
 PT Cancellation Note  Patient Details Name: Courtney Thomas MRN: [id number] DOB: 10/07/1945   Cancelled Treatment:    Reason Eval/Treat Not Completed:  (Treatment session attempted.  Patient just concluded with OT session; generally fatigued and unable to tolerate additional activity at this time.Will re-attempt at later time/date as medically appropriate)   Levern Kalka H. [name], PT, DPT, NCS 09/04/24, 5:18 PM [phone]

## 2024-09-04 NOTE — Progress Notes (Signed)
 Occupational Therapy Treatment Patient Details Name: Courtney Thomas MRN: [id number] DOB: Jan 30, 1946 Today's Date: 09/04/2024   History of present illness FATUMA DOWERS is a 79 y.o. female with a history of HTN, HLD, osteoporosis, adjustment disorder with depression, and asthma, presents to the ED accompanied by her husband.  Patient is currently at [facility] healthcare, for rehab due to mobility and gait issues. Presents with increased weakness and AMS; admitted for management of AMS, hypokalemia, UTI (?)   OT comments  Patient seen for OT treatment on this date. Upon arrival to room patient asleep in bed, bed soiled. Patient transitioned to EOB with max A, max A for standing for pericare with NT. Patient oriented to self only. Returned back to bed with max A, sat patient up in chair position and set up meal tray, patient unable to initiate feeding and required feeding A, noted she had pocketed a pill earlier in the day, RN notified of need for oral care. Patient left up right in bed, alert/awake. Patient making fair progress toward goals, will continue to follow POC. Discharge recommendation remains appropriate.        If plan is discharge home, recommend the following:  Two people to help with walking and/or transfers;A lot of help with bathing/dressing/bathroom;Two people to help with bathing/dressing/bathroom;Direct supervision/assist for financial management;Direct supervision/assist for medications management;Assistance with feeding;Supervision due to cognitive status;Help with stairs or ramp for entrance   Equipment Recommendations  None recommended by OT    Recommendations for Other Services      Precautions / Restrictions Precautions Precautions: Fall Recall of Precautions/Restrictions: Impaired Restrictions Weight Bearing Restrictions Per Provider Order: No       Mobility Bed Mobility Overal bed mobility: Needs Assistance Bed Mobility: Supine to Sit, Sit to Supine Rolling:  Max assist   Supine to sit: Max assist Sit to supine: Max assist        Transfers Overall transfer level: Needs assistance Equipment used: 1 person hand held assist Transfers: Sit to/from Stand Sit to Stand: Max assist                 Balance Overall balance assessment: Needs assistance Sitting-balance support: Feet supported, No upper extremity supported Sitting balance-Leahy Scale: Poor     Standing balance support: Bilateral upper extremity supported Standing balance-Leahy Scale: Zero                             ADL either performed or assessed with clinical judgement   ADL Overall ADL's : Needs assistance/impaired Eating/Feeding: Maximal assistance;Cueing for sequencing;Bed level Eating/Feeding Details (indicate cue type and reason): unable to initiate                                   General ADL Comments: total A for all tasks    Extremity/Trunk Assessment Upper Extremity Assessment Upper Extremity Assessment: Generalized weakness   Lower Extremity Assessment Lower Extremity Assessment: Generalized weakness        Vision       Perception     Praxis     Communication Communication Communication: Impaired Factors Affecting Communication: Reduced clarity of speech   Cognition Arousal: Alert, Lethargic Behavior During Therapy: Restless Cognition: Cognition impaired, History of cognitive impairments   Orientation impairments: Place, Situation Awareness: Intellectual awareness impaired Memory impairment (select all impairments): Short-term memory Attention impairment (select first level of impairment): Sustained  attention Executive functioning impairment (select all impairments): Sequencing, Initiation, Organization, Reasoning, Problem solving                   Following commands: Impaired Following commands impaired: Follows one step commands inconsistently      Cueing   Cueing Techniques: Verbal cues,  Tactile cues, Gestural cues  Exercises      Shoulder Instructions       General Comments required full linen change due to incontinence upon OT arrival    Pertinent Vitals/ Pain       Pain Assessment Pain Assessment: Faces Faces Pain Scale: No hurt  Home Living                                          Prior Functioning/Environment              Frequency  Min 2X/week        Progress Toward Goals  OT Goals(current goals can now be found in the care plan section)  Progress towards OT goals: Progressing toward goals  Acute Rehab OT Goals Patient Stated Goal: none OT Goal Formulation: Patient unable to participate in goal setting Time For Goal Achievement: 09/16/24 Potential to Achieve Goals: Fair ADL Goals Pt Will Perform Grooming: with min assist;sitting Pt Will Perform Upper Body Dressing: with min assist;sitting Pt Will Transfer to Toilet: with mod assist;stand pivot transfer;regular height toilet;bedside commode Pt Will Perform Toileting - Clothing Manipulation and hygiene: with min assist;sit to/from stand  Plan      Co-evaluation                 AM-PAC OT 6 Clicks Daily Activity     Outcome Measure   Help from another person eating meals?: A Little Help from another person taking care of personal grooming?: A Little Help from another person toileting, which includes using toliet, bedpan, or urinal?: A Lot Help from another person bathing (including washing, rinsing, drying)?: A Lot Help from another person to put on and taking off regular upper body clothing?: A Lot Help from another person to put on and taking off regular lower body clothing?: Total 6 Click Score: 13    End of Session    OT Visit Diagnosis: Unsteadiness on feet (R26.81);Repeated falls (R29.6);Other abnormalities of gait and mobility (R26.89);Muscle weakness (generalized) (M62.81);History of falling (Z91.81)   Activity Tolerance Patient limited by  fatigue   Patient Left in bed;with call bell/phone within reach;with bed alarm set   Nurse Communication Other (comment) (needs a feeder and oral care)        Time: 8562-8480 OT Time Calculation (min): 42 min  Charges: OT General Charges $OT Visit: 1 Visit OT Treatments $Self Care/Home Management : 38-52 mins  Rogers Clause, OT/L MSOT, 09/04/2024

## 2024-09-04 NOTE — TOC Progression Note (Signed)
 Transition of Care [facility]) - Progression Note    Patient Details  Name: Courtney Thomas MRN: [id number] Date of Birth: Jun 28, 1946  Transition of [facility]) CM/SW Contact  Grayce JAYSON Perfect, RN Phone Number: 09/04/2024, 4:18 PM  Clinical Narrative:   RNCM met with patient and her significant other in room.  Notified them of bed offer from [facility].  She does not want to return there and states she is willing to go out of town if she has to before going back there  [facility] sent out referrals to [place] [facility] [place] [facility].  Explained to her that when medically ready will need to make decision on discharge plan.  RNCM will continue to follow case until discharge.                      Expected Discharge Plan and Services                                               Social Drivers of Health (SDOH) Interventions SDOH Screenings   Food Insecurity: No Food Insecurity (09/03/2024)  Housing: Low Risk (09/03/2024)  Transportation Needs: No Transportation Needs (09/03/2024)  Utilities: Not At Risk (09/03/2024)  Alcohol Screen: Low Risk (08/30/2022)  Depression (PHQ2-9): Medium Risk (05/01/2024)  Financial Resource Strain: Low Risk  (02/20/2024)   Received from [facility]  Physical Activity: Inactive (08/30/2022)  Social Connections: Unknown (09/03/2024)  Stress: No Stress Concern Present (08/30/2022)  Tobacco Use: Medium Risk (08/31/2024)    Readmission Risk Interventions     No data to display

## 2024-09-04 NOTE — Plan of Care (Signed)

## 2024-09-04 NOTE — Hospital Course (Addendum)
 HPI:Courtney Thomas is an elderly female brought to the ED from the skilled nursing facility (SNF) [facility], where she has resided for approximately one month after losing the ability to walk following a dental abscess that developed the day after Thanksgiving, according to her partner, [name]. She has been weak and wheelchair-bound since then. She has had some falls at the nursing facility. She was brought to the hospital because of general weakness, poor oral intake, increased frequency of micturition and urgency of micturition. Family: Daughter (in [place]) designated healthcare power of attorney.   Hospital course / significant events: 01/31: admitted to hospitalist w/ AMS,treating for UTI w/ ceftriaxone , holding CNS/psychoactive Rx 02/01: pending cultures  02/02: Urine culture growing E. coli.  02/03: Pansensitive Ecoli. Transition to Keflex . Potassium down from 3.3-3.0 despite repletion. Continue potassium repletion and monitor levels.  02/04: await SNF bed offers 02/05: pending decision/auth for bed offer from [facility]  02/06: bed offer was rescinded, still working on placement, family appealing      Consultants:  none  Procedures/Surgeries: none      ASSESSMENT & PLAN:   Acute E. coli UTI:  Urine culture showed pansensitive E. coli.  No growth on blood cultures. IV cefazolin to Keflex .  Plan to treat for total of 5 days - completed treatment.     Acute metabolic encephalopathy: Improved.  Probably acute UTI contributed to confusion.  She has some mild cognitive impairment/dementia at baseline and likely this is contributing to hospital delirium monitor   Hypokalemia Replace as needed Monitor BMP  General weakness: PT and OT recommended discharge to SNF.   Follow-up with TOC to assist with disposition.    Acute thrombocytopenia:  Platelet dropped from 193-100.   Follow CBC      Comorbidities include HTN COPD    Borderline underweight based  on BMI: Body mass index is 19.53 kg/m.SABRA Significantly low or high BMI is associated with higher medical risk.  Underweight - under 18  overweight - 25 to 29 obese - 30 or more Class 1 obesity: BMI of 30.0 to 34 Class 2 obesity: BMI of 35.0 to 39 Class 3 obesity: BMI of 40.0 to 49 Super Morbid Obesity: BMI 50-59 Super-super Morbid Obesity: BMI 60+ Healthy nutrition and physical activity advised as adjunct to other disease management and risk reduction treatments    DVT prophylaxis: lovenox  IV fluids: no continuous IV fluids  Nutrition: regular Central lines / other devices: none  Code Status: FULL CODE ACP documentation reviewed: has advanced on file in [facility]  [facility] needs: placement - pt refuses to go back to [facility] barriers to dispo at this time: none. Expected readiness for discharge per TOC at this time:

## 2024-09-04 NOTE — Progress Notes (Signed)
 " PROGRESS NOTE    Courtney Thomas   FMW:[id number] DOB: 04/18/1946  DOA: 08/31/2024 Date of Service: 09/04/24 which is hospital day 4  PCP: Courtney Baller, MD    HPI:Courtney Thomas is an elderly female brought to the ED from the skilled nursing facility (SNF) [facility], where she has resided for approximately one month after losing the ability to walk following a dental abscess that developed the day after Thanksgiving, according to her partner, Courtney Thomas. She has been weak and wheelchair-bound since then. She has had some falls at the nursing facility. She was brought to the hospital because of general weakness, poor oral intake, increased frequency of micturition and urgency of micturition. Family: Courtney (in [place]) designated healthcare power of attorney.   Hospital course / significant events: 01/31: admitted to hospitalist w/ AMS,treating for UTI w/ ceftriaxone , holding CNS/psychoactive Rx 02/01: pending cultures  02/02: Urine culture growing E. coli.  02/03: Pansensitive Ecoli. Transition to Keflex . Potassium down from 3.3-3.0 despite repletion. Continue potassium repletion and monitor levels.  02/04: await SNF bed offers     Consultants:  none  Procedures/Surgeries: none      ASSESSMENT & PLAN:   Acute E. coli UTI:  Urine culture showed pansensitive E. coli.  No growth on blood cultures. IV cefazolin to Keflex .  Plan to treat for total of 5 days.     Acute metabolic encephalopathy: Improved.  Probably acute UTI contributed to confusion.  She has some mild cognitive impairment/dementia at baseline. Mental status probably at baseline monitor   Hypokalemia Replace as needed Monitor BMP  General weakness: PT and OT recommended discharge to SNF.   Follow-up with TOC to assist with disposition.    Acute thrombocytopenia:  Platelet dropped from 193-100.   Follow CBC      Comorbidities include HTN COPD    Borderline underweight based on BMI: Body  mass index is 19.53 kg/m.SABRA Significantly low or high BMI is associated with higher medical risk.  Underweight - under 18  overweight - 25 to 29 obese - 30 or more Class 1 obesity: BMI of 30.0 to 34 Class 2 obesity: BMI of 35.0 to 39 Class 3 obesity: BMI of 40.0 to 49 Super Morbid Obesity: BMI 50-59 Super-super Morbid Obesity: BMI 60+ Healthy nutrition and physical activity advised as adjunct to other disease management and risk reduction treatments    DVT prophylaxis: lovenox  IV fluids: no continuous IV fluids  Nutrition: regular Central lines / other devices: none  Code Status: FULL CODE ACP documentation reviewed: has advanced on file in [facility]  [facility] needs: placement - pt refuses to go back to [employer] [facility] barriers to dispo at this time: none. Expected readiness for discharge per TOC at this time:               Subjective / Brief ROS:  Patient reports no concerns Denies CP/SOB.  Pain controlled.  Denies new weakness.  Tolerating diet.  Reports no concerns w/ urination/defecation.   Family Communication: none at this time - pastor is at bedside on rounds     Objective Findings:  Vitals:   09/03/24 0859 09/03/24 1532 09/03/24 2122 09/04/24 0536  BP: (!) 158/64 (!) 157/50 (!) 152/62 (!) 140/84  Pulse: 77 62 91 92  Resp: 16 16 16 15   Temp: 98.7 F (37.1 C) 98 F (36.7 C) 97.9 F (36.6 C) 97.9 F (36.6 C)  TempSrc:      SpO2: 97% 97% 95% 94%  Weight:  Height:        Intake/Output Summary (Last 24 hours) at 09/04/2024 1649 Last data filed at 09/03/2024 1700 Gross per 24 hour  Intake 240 ml  Output --  Net 240 ml   Filed Weights   08/31/24 1223  Weight: 45.4 kg    Examination:  Physical Exam Constitutional:      General: She is not in acute distress. Cardiovascular:     Rate and Rhythm: Normal rate and regular rhythm.  Pulmonary:     Effort: Pulmonary effort is normal.  Abdominal:     Palpations: Abdomen is soft.   Neurological:     Mental Status: She is alert.  Psychiatric:        Mood and Affect: Mood normal.        Behavior: Behavior normal.          Scheduled Medications:   amLODipine   5 mg Oral Daily   atorvastatin   10 mg Oral Daily   cephALEXin   500 mg Oral Q8H   enoxaparin  (LOVENOX ) injection  40 mg Subcutaneous Q24H   feeding supplement  237 mL Oral BID BM   fluticasone  furoate-vilanterol  1 puff Inhalation Daily   lisinopril   20 mg Oral Daily   pantoprazole   40 mg Oral QHS   potassium chloride   10 mEq Oral Daily   propranolol   10 mg Oral BID    Continuous Infusions:   PRN Medications:  acetaminophen  **OR** acetaminophen , ondansetron  **OR** ondansetron  (ZOFRAN ) IV, polyethylene glycol  Antimicrobials from admission:  Anti-infectives (From admission, onward)    Start     Dose/Rate Route Frequency Ordered Stop   09/03/24 2200  cephALEXin  (KEFLEX ) capsule 500 mg        500 mg Oral Every 8 hours 09/03/24 0737 09/05/24 2159   09/01/24 2200  cefTRIAXone  (ROCEPHIN ) 1 g in sodium chloride  0.9 % 100 mL IVPB  Status:  Discontinued        1 g 200 mL/hr over 30 Minutes Intravenous Every 24 hours 08/31/24 2119 09/03/24 0737   08/31/24 1945  cefTRIAXone  (ROCEPHIN ) 1 g in sodium chloride  0.9 % 100 mL IVPB        1 g 200 mL/hr over 30 Minutes Intravenous  Once 08/31/24 1944 08/31/24 2027           Data Reviewed:  I have personally reviewed the following...  CBC: Recent Labs  Lab 08/31/24 1224 09/01/24 0505 09/02/24 0407 09/04/24 0439  WBC 8.8 8.2 6.4 11.9*  HGB 12.8 11.4* 12.8 13.2  HCT 39.4 34.7* 38.9 39.5  MCV 91.8 93.0 93.1 90.8  PLT 247 193 100* 265   Basic Metabolic Panel: Recent Labs  Lab 08/31/24 1224 08/31/24 1833 09/01/24 0505 09/01/24 1651 09/02/24 0407 09/03/24 0524 09/04/24 0439  NA 142  --  140  --  138 139  --   K 2.9*  --  2.9* 3.7 3.3* 3.0* 4.7  CL 99  --  104  --  100 100  --   CO2 31  --  26  --  24 27  --   GLUCOSE 95  --  85  --  88  98  --   BUN 10  --  8  --  10 7*  --   CREATININE 0.70  --  0.57  --  0.73 0.65  --   CALCIUM  9.7  --  8.3*  --  9.2 9.0  --   MG  --  1.9  --   --  2.0  --   --   PHOS  --  3.7  --   --   --  3.9  --    GFR: Estimated Creatinine Clearance: 42.2 mL/min (by C-G formula based on SCr of 0.65 mg/dL). Liver Function Tests: Recent Labs  Lab 08/31/24 1224 09/03/24 0524  AST 11*  --   ALT 11  --   ALKPHOS 89  --   BILITOT 0.7  --   PROT 6.8  --   ALBUMIN 4.2 3.5   No results for input(s): LIPASE, AMYLASE in the last 168 hours. Recent Labs  Lab 09/01/24 1150  AMMONIA 30   Coagulation Profile: No results for input(s): INR, PROTIME in the last 168 hours. Cardiac Enzymes: No results for input(s): CKTOTAL, CKMB, CKMBINDEX, TROPONINI in the last 168 hours. BNP (last 3 results) No results for input(s): PROBNP in the last 8760 hours. HbA1C: No results for input(s): HGBA1C in the last 72 hours. CBG: No results for input(s): GLUCAP in the last 168 hours. Lipid Profile: No results for input(s): CHOL, HDL, LDLCALC, TRIG, CHOLHDL, LDLDIRECT in the last 72 hours. Thyroid  Function Tests: No results for input(s): TSH, T4TOTAL, FREET4, T3FREE, THYROIDAB in the last 72 hours. Anemia Panel: Recent Labs    09/02/24 0407  VITAMINB12 426   Most Recent Urinalysis On File:     Component Value Date/Time   COLORURINE AMBER (A) 08/31/2024 1832   APPEARANCEUR CLOUDY (A) 08/31/2024 1832   LABSPEC 1.016 08/31/2024 1832   PHURINE 5.0 08/31/2024 1832   GLUCOSEU NEGATIVE 08/31/2024 1832   GLUCOSEU NEGATIVE 05/24/2022 1547   HGBUR NEGATIVE 08/31/2024 1832   BILIRUBINUR NEGATIVE 08/31/2024 1832   BILIRUBINUR negative 01/09/2024 1238   KETONESUR NEGATIVE 08/31/2024 1832   PROTEINUR NEGATIVE 08/31/2024 1832   UROBILINOGEN 0.2 01/09/2024 1238   UROBILINOGEN 2.0 (A) 05/24/2022 1547   NITRITE POSITIVE (A) 08/31/2024 1832   LEUKOCYTESUR MODERATE (A)  08/31/2024 1832   Sepsis Labs: @LABRCNTIP (procalcitonin:4,lacticidven:4) Microbiology: Recent Results (from the past 240 hours)  Urine Culture (for pregnant, neutropenic or urologic patients or patients with an indwelling urinary catheter)     Status: Abnormal   Collection Time: 08/31/24  6:32 PM   Specimen: Urine, Random  Result Value Ref Range Status   Specimen Description   Final    URINE, RANDOM Performed at [facility], [address] [place], [place] [address]    Special Requests   Final    NONE Performed at [facility], [address] [place], [place] [address]    Culture >=100,000 COLONIES/mL ESCHERICHIA COLI (A)  Final   Report Status 09/03/2024 FINAL  Final   Organism ID, Bacteria ESCHERICHIA COLI (A)  Final      Susceptibility   Escherichia coli - MIC*    AMPICILLIN 4 SENSITIVE Sensitive     CEFAZOLIN (URINE) Value in next row Sensitive      2 SENSITIVEThis is a modified FDA-approved test that has been validated and its performance characteristics determined by the reporting laboratory.  This laboratory is certified under the Clinical Laboratory Improvement Amendments CLIA as qualified to perform high complexity clinical laboratory testing.    CEFEPIME Value in next row Sensitive      2 SENSITIVEThis is a modified FDA-approved test that has been validated and its performance characteristics determined by the reporting laboratory.  This laboratory is certified under the Clinical Laboratory Improvement Amendments CLIA as qualified to perform high complexity clinical laboratory testing.    ERTAPENEM Value in  next row Sensitive      2 SENSITIVEThis is a modified FDA-approved test that has been validated and its performance characteristics determined by the reporting laboratory.  This laboratory is certified under the Clinical Laboratory Improvement Amendments CLIA as qualified to perform high complexity clinical laboratory testing.    CEFTRIAXONE  Value in  next row Sensitive      2 SENSITIVEThis is a modified FDA-approved test that has been validated and its performance characteristics determined by the reporting laboratory.  This laboratory is certified under the Clinical Laboratory Improvement Amendments CLIA as qualified to perform high complexity clinical laboratory testing.    CIPROFLOXACIN  Value in next row Sensitive      2 SENSITIVEThis is a modified FDA-approved test that has been validated and its performance characteristics determined by the reporting laboratory.  This laboratory is certified under the Clinical Laboratory Improvement Amendments CLIA as qualified to perform high complexity clinical laboratory testing.    GENTAMICIN Value in next row Sensitive      2 SENSITIVEThis is a modified FDA-approved test that has been validated and its performance characteristics determined by the reporting laboratory.  This laboratory is certified under the Clinical Laboratory Improvement Amendments CLIA as qualified to perform high complexity clinical laboratory testing.    NITROFURANTOIN Value in next row Sensitive      2 SENSITIVEThis is a modified FDA-approved test that has been validated and its performance characteristics determined by the reporting laboratory.  This laboratory is certified under the Clinical Laboratory Improvement Amendments CLIA as qualified to perform high complexity clinical laboratory testing.    TRIMETH /SULFA  Value in next row Sensitive      2 SENSITIVEThis is a modified FDA-approved test that has been validated and its performance characteristics determined by the reporting laboratory.  This laboratory is certified under the Clinical Laboratory Improvement Amendments CLIA as qualified to perform high complexity clinical laboratory testing.    AMPICILLIN/SULBACTAM Value in next row Sensitive      2 SENSITIVEThis is a modified FDA-approved test that has been validated and its performance characteristics determined by the  reporting laboratory.  This laboratory is certified under the Clinical Laboratory Improvement Amendments CLIA as qualified to perform high complexity clinical laboratory testing.    PIP/TAZO Value in next row Sensitive      <=4 SENSITIVEThis is a modified FDA-approved test that has been validated and its performance characteristics determined by the reporting laboratory.  This laboratory is certified under the Clinical Laboratory Improvement Amendments CLIA as qualified to perform high complexity clinical laboratory testing.    MEROPENEM Value in next row Sensitive      <=4 SENSITIVEThis is a modified FDA-approved test that has been validated and its performance characteristics determined by the reporting laboratory.  This laboratory is certified under the Clinical Laboratory Improvement Amendments CLIA as qualified to perform high complexity clinical laboratory testing.    * >=100,000 COLONIES/mL ESCHERICHIA COLI  CULTURE, BLOOD (ROUTINE X 2) w Reflex to ID Panel     Status: None (Preliminary result)   Collection Time: 09/01/24  8:53 AM   Specimen: BLOOD  Result Value Ref Range Status   Specimen Description   Final    BLOOD Blood Culture results may not be optimal due to an inadequate volume of blood received in culture bottles   Special Requests   Final    BOTTLES DRAWN AEROBIC AND ANAEROBIC RIGHT ANTECUBITAL   Culture   Final    NO GROWTH 3 DAYS Performed at [facility]  [address] [place], [place] [address]    Report Status PENDING  Incomplete  MRSA Next Gen by PCR, Nasal     Status: None   Collection Time: 09/03/24  6:15 AM   Specimen: Nasal Mucosa; Nasal Swab  Result Value Ref Range Status   MRSA by PCR Next Gen NOT DETECTED NOT DETECTED Final    Comment: (NOTE) The GeneXpert MRSA Assay (FDA approved for NASAL specimens only), is one component of a comprehensive MRSA colonization surveillance program. It is not intended to diagnose MRSA infection nor to guide or  monitor treatment for MRSA infections. Test performance is not FDA approved in patients less than 32 years old. Performed at [facility], [address] [place], [place] [address]       Radiology Studies last 3 days: DG Chest 1 View Result Date: 08/31/2024 EXAM: 1 VIEW(S) XRAY OF THE CHEST 08/31/2024 06:16:00 PM COMPARISON: 11 / 17 / 23 . CLINICAL HISTORY: Right anterior rib pain; cough. FINDINGS: LUNGS AND PLEURA: Hyperinflation and chronic bronchitic change. Right greater than left apical scarring. No focal pulmonary opacity. No pleural effusion. No pneumothorax. HEART AND MEDIASTINUM: Aortic atherosclerotic calcification. No acute abnormality of the cardiac and mediastinal silhouettes. BONES AND SOFT TISSUES: No acute osseous abnormality. IMPRESSION: 1. No acute cardiopulmonary abnormality. 2. Emphysema. Electronically signed by: Norman Gatlin MD 08/31/2024 06:23 PM EST RP Workstation: [id number]   CT Head Wo Contrast Result Date: 08/31/2024 CLINICAL DATA:  Altered level of consciousness, possible urinary tract infection EXAM: CT HEAD WITHOUT CONTRAST TECHNIQUE: Contiguous axial images were obtained from the base of the skull through the vertex without intravenous contrast. RADIATION DOSE REDUCTION: This exam was performed according to the departmental dose-optimization program which includes automated exposure control, adjustment of the mA and/or kV according to patient size and/or use of iterative reconstruction technique. COMPARISON:  08/18/2024 FINDINGS: Brain: Stable chronic small-vessel ischemic changes within the periventricular white matter. No evidence of acute infarct or hemorrhage. The lateral ventricles and midline structures are stable. No acute extra-axial fluid collections. No mass effect. Vascular: No hyperdense vessel or unexpected calcification. Skull: Normal. Negative for fracture or focal lesion. Sinuses/Orbits: No acute finding. Other: None. IMPRESSION: 1. Stable  head CT, no acute intracranial process. Electronically Signed   By: Ozell Daring M.D.   On: 08/31/2024 17:48         Zuri Lascala, DO [facility] 09/04/2024, 4:49 PM    Dictation software may have been used to generate the above note. Typos may occur and escape review in typed/dictated notes. Please contact Dr Marsa directly for clarity if needed.  Staff may message me via secure chat in Epic  but this may not receive an immediate response,  please page me for urgent matters!  If 7PM-7AM, please contact night coverage [email or web address]       "

## 2024-09-05 ENCOUNTER — Other Ambulatory Visit: Payer: Self-pay

## 2024-09-05 DIAGNOSIS — E43 Unspecified severe protein-calorie malnutrition: Secondary | ICD-10-CM | POA: Insufficient documentation

## 2024-09-05 MED ORDER — ADULT MULTIVITAMIN W/MINERALS CH
1.0000 | ORAL_TABLET | Freq: Every day | ORAL | Status: AC
  Administered 2024-09-05 – 2024-09-06 (×2): 1 via ORAL
  Filled 2024-09-05 (×2): qty 1

## 2024-09-05 NOTE — Plan of Care (Signed)

## 2024-09-05 NOTE — Progress Notes (Signed)
 Physical Therapy Treatment Patient Details Name: Courtney Thomas MRN: [id number] DOB: 1946-02-07 Today's Date: 09/05/2024   History of Present Illness Courtney Thomas is a 79 y.o. female with a history of HTN, HLD, osteoporosis, adjustment disorder with depression, and asthma, presents to the ED accompanied by her husband.  Patient is currently at [facility] healthcare, for rehab due to mobility and gait issues. Presents with increased weakness and AMS; admitted for management of AMS, hypokalemia, UTI (?)    PT Comments  Pt alert, but keeps eyes closed throughout session. PT assisted with eye opening twice and pt unable/unwilling to maintain. Pt required maxA-totalA for all mobility. Required maxA in sitting as well due to L lateral lean. Noted for soiled gown and bed linen, maxA to address this as well. Squat pivot to recliner with totalA but pt immediately attempting to turns sideways and appeared very unsafe, returned to bed with family at bedside. The patient would benefit from further skilled PT intervention to continue to progress towards goals as able.     If plan is discharge home, recommend the following: Two people to help with walking and/or transfers;Two people to help with bathing/dressing/bathroom   Can travel by private vehicle     No  Equipment Recommendations    TBD   Recommendations for Other Services       Precautions / Restrictions Precautions Precautions: Fall Recall of Precautions/Restrictions: Impaired Restrictions Weight Bearing Restrictions Per Provider Order: No     Mobility  Bed Mobility Overal bed mobility: Needs Assistance Bed Mobility: Supine to Sit, Sit to Supine Rolling: Max assist   Supine to sit: Max assist Sit to supine: Max assist        Transfers Overall transfer level: Needs assistance Equipment used: 1 person hand held assist Transfers: Bed to chair/wheelchair/BSC       Squat pivot transfers: Total assist           Ambulation/Gait                   Stairs             Wheelchair Mobility     Tilt Bed    Modified Rankin (Stroke Patients Only)       Balance Overall balance assessment: Needs assistance Sitting-balance support: Feet supported, No upper extremity supported Sitting balance-Leahy Scale: Zero Sitting balance - Comments: L lateral lean throughout Postural control: Left lateral lean Standing balance support: Bilateral upper extremity supported Standing balance-Leahy Scale: Zero                              Communication Communication Communication: Impaired  Cognition Arousal: Alert Behavior During Therapy: Restless   PT - Cognitive impairments: No family/caregiver present to determine baseline                       PT - Cognition Comments: pt oriented to self, family. eyes closed throughout session, PT assisted with opening twice, unable to maintain Following commands: Impaired Following commands impaired: Follows one step commands inconsistently    Cueing Cueing Techniques: Verbal cues, Tactile cues, Gestural cues  Exercises      General Comments        Pertinent Vitals/Pain Pain Assessment Pain Assessment: Faces Faces Pain Scale: Hurts even more Pain Location: pt would only state that she felt dizzy once, but generalized discomfort noted throughout Pain Descriptors / Indicators: Guarding, Grimacing, Moaning Pain Intervention(s):  Limited activity within patient's tolerance, Monitored during session, Repositioned    Home Living                          Prior Function            PT Goals (current goals can now be found in the care plan section) Progress towards PT goals: Progressing toward goals (limited)    Frequency    Min 2X/week      PT Plan      Co-evaluation              AM-PAC PT 6 Clicks Mobility   Outcome Measure  Help needed turning from your back to your side while in a flat  bed without using bedrails?: A Lot Help needed moving from lying on your back to sitting on the side of a flat bed without using bedrails?: A Lot Help needed moving to and from a bed to a chair (including a wheelchair)?: Total Help needed standing up from a chair using your arms (e.g., wheelchair or bedside chair)?: Total Help needed to walk in hospital room?: Total Help needed climbing 3-5 steps with a railing? : Total 6 Click Score: 8    End of Session Equipment Utilized During Treatment: Gait belt Activity Tolerance: Patient tolerated treatment well Patient left: in bed;with call bell/phone within reach;with bed alarm set Nurse Communication: Mobility status PT Visit Diagnosis: Muscle weakness (generalized) (M62.81);Difficulty in walking, not elsewhere classified (R26.2)     Time: 9140-9079 PT Time Calculation (min) (ACUTE ONLY): 21 min  Charges:    $Therapeutic Activity: 8-22 mins PT General Charges $$ ACUTE PT VISIT: 1 Visit                     Doyal Shams PT, DPT 12:08 PM,09/05/24

## 2024-09-05 NOTE — TOC Progression Note (Signed)
 Transition of Care [facility]) - Progression Note    Patient Details  Name: Courtney Thomas MRN: [id number] Date of Birth: 04-10-1946  Transition of [facility]) CM/SW Contact  Grayce JAYSON Perfect, RN Phone Number: 09/05/2024, 2:34 PM  Clinical Narrative:   RNCM notified patient and friend in room that [facility] had offered a rehab bed.  They both state need to talk with her daughter.  RNCM left message with daughter to return my call regarding proceeding with this facility.  Patient has refused [employer] and notified that when medically ready patient will have options for SNF offers or return home.  They state understanding.RNCM will continue to follow case until discharge                      Expected Discharge Plan and Services                                               Social Drivers of Health (SDOH) Interventions SDOH Screenings   Food Insecurity: No Food Insecurity (09/03/2024)  Housing: Low Risk (09/03/2024)  Transportation Needs: No Transportation Needs (09/03/2024)  Utilities: Not At Risk (09/03/2024)  Alcohol Screen: Low Risk (08/30/2022)  Depression (PHQ2-9): Medium Risk (05/01/2024)  Financial Resource Strain: Low Risk  (02/20/2024)   Received from [facility]  Physical Activity: Inactive (08/30/2022)  Social Connections: Unknown (09/03/2024)  Stress: No Stress Concern Present (08/30/2022)  Tobacco Use: Medium Risk (08/31/2024)    Readmission Risk Interventions     No data to display

## 2024-09-05 NOTE — Progress Notes (Signed)
 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  Ongoing  GOAL:   Patient will meet greater than or equal to 90% of their needs  Progressing  MONITOR:   PO intake, Supplement acceptance  REASON FOR ASSESSMENT:   Consult Assessment of nutrition requirement/status  ASSESSMENT:   Pt with hx of HTN, HLD, asthma, COPD, and osteoporosis. Multiple recent falls reported from SNF, pt admitted with AMS and UTI.  Reviewed I/O's: +1.8 L since admission   Patient lying in bed at time of visit. No family present at time of visit.   Patient awoke easily to voice. She reports feeling better today. She reports her appetite has been good. Observed breakfast tray; she consumed all of her coffee, 50% of bagel and bacon, and 100% of oatmeal. Ensure and Magic Cup untouched, however, patient reports she likes these.   Reviewed weight history; patient has experienced a 6.8% weight loss over the past 6 months, which is not significant for time frame.   Per TOC notes, plan SNF placement at discharge.   Labs reviewed.    NUTRITION - FOCUSED PHYSICAL EXAM:  Flowsheet Row Most Recent Value  Orbital Region Moderate depletion  Upper Arm Region Severe depletion  Thoracic and Lumbar Region Moderate depletion  Buccal Region Severe depletion  Temple Region Moderate depletion  Clavicle Bone Region Severe depletion  Clavicle and Acromion Bone Region Severe depletion  Scapular Bone Region Severe depletion   Dorsal Hand Severe depletion  Patellar Region Moderate depletion  Anterior Thigh Region Moderate depletion  Posterior Calf Region Moderate depletion  Edema (RD Assessment) None  Hair Reviewed  Eyes Reviewed  Mouth Reviewed  Skin Reviewed  Nails Reviewed    Diet Order:   Diet Order             Diet regular Room service appropriate? Yes; Fluid consistency: Thin  Diet effective now                   EDUCATION NEEDS:   Education needs have been addressed  Skin:  Skin Assessment: Reviewed RN Assessment  Last BM:  09/05/24 (type 6)  Height:   Ht Readings from Last 1 Encounters:  08/31/24 5' (1.524 m)    Weight:   Wt Readings from Last 1 Encounters:  08/31/24 45.4 kg    Ideal Body Weight:  45.5 kg  BMI:  Body mass index is 19.53 kg/m.  Estimated Nutritional Needs:   Kcal:  1600-1800  Protein:  85-100 grams  Fluid:  1.6-1.8 L    Margery ORN, RD, LDN, CDCES Registered Dietitian III Certified Diabetes Care and Education Specialist If unable to reach this RD, please use RD Inpatient group chat on secure chat between hours of 8am-4 pm daily

## 2024-09-05 NOTE — Progress Notes (Signed)
 " PROGRESS NOTE    Courtney Thomas   FMW:[id number] DOB: 06/04/1946  DOA: 08/31/2024 Date of Service: 09/05/24 which is Thomas day 5  PCP: Courtney Baller, MD    HPI:[name] is an elderly female brought to the ED from the skilled nursing facility (SNF) [facility], where she has resided for approximately one month after losing the ability to walk following a dental abscess that developed the day after Thanksgiving, according to her partner, Courtney Thomas. She has been weak and wheelchair-bound since then. She has had some falls at the nursing facility. She was brought to the [facility] because of general weakness, poor oral intake, increased frequency of micturition and urgency of micturition. Family: Daughter (in [place]) designated healthcare power of attorney.   Thomas course / significant events: 01/31: admitted to hospitalist w/ AMS,treating for UTI w/ ceftriaxone , holding CNS/psychoactive Rx 02/01: pending cultures  02/02: Urine culture growing E. coli.  02/03: Pansensitive Ecoli. Transition to Keflex . Potassium down from 3.3-3.0 despite repletion. Continue potassium repletion and monitor levels.  02/04: await SNF bed offers 02/05: pending decision/auth for bed offer from Courtney Thomas      Consultants:  none  Procedures/Surgeries: none      ASSESSMENT & PLAN:   Acute E. coli UTI:  Urine culture showed pansensitive E. coli.  No growth on blood cultures. IV cefazolin to Keflex .  Plan to treat for total of 5 days - completed treatment.     Acute metabolic encephalopathy: Improved.  Probably acute UTI contributed to confusion.  She has some mild cognitive impairment/dementia at baseline and likely this is contributing to Thomas delirium monitor   Hypokalemia Replace as needed Monitor BMP  General weakness: PT and OT recommended discharge to SNF.   Follow-up with TOC to assist with disposition.    Acute thrombocytopenia:  Platelet dropped from  193-100.   Follow CBC      Comorbidities include HTN COPD    Borderline underweight based on BMI: Body mass index is 19.53 kg/m.SABRA Significantly low or high BMI is associated with higher medical risk.  Underweight - under 18  overweight - 25 to 29 obese - 30 or more Class 1 obesity: BMI of 30.0 to 34 Class 2 obesity: BMI of 35.0 to 39 Class 3 obesity: BMI of 40.0 to 49 Super Morbid Obesity: BMI 50-59 Super-super Morbid Obesity: BMI 60+ Healthy nutrition and physical activity advised as adjunct to other disease management and risk reduction treatments    DVT prophylaxis: lovenox  IV fluids: no continuous IV fluids  Nutrition: regular Central lines / other devices: none  Code Status: FULL CODE ACP documentation reviewed: has advanced on file in [facility]  Courtney Thomas needs: placement - pt refuses to go back to [facility] barriers to dispo at this time: none. Expected readiness for discharge per TOC at this time:               Subjective / Brief ROS:  Patient reports no concerns She is much more alert today  Denies CP/SOB.  Pain controlled.  Denies new weakness.  Tolerating diet.  Reports no concerns w/ urination/defecation.   Family Communication: none at this time    Objective Findings:  Vitals:   09/04/24 2020 09/05/24 0532 09/05/24 0806 09/05/24 1549  BP: (!) 119/57 117/64 104/75 121/64  Pulse: 71 88 92 78  Resp: 16 17 16 16   Temp: 97.9 F (36.6 C) 98.4 F (36.9 C) 98 F (36.7 C) 98.4 F (36.9 C)  TempSrc:  Axillary  Oral  SpO2: 99% 94% 99% 96%  Weight:      Height:       No intake or output data in the 24 hours ending 09/05/24 1734  Filed Weights   08/31/24 1223  Weight: 45.4 kg    Examination:  Physical Exam Constitutional:      General: She is not in acute distress. Cardiovascular:     Rate and Rhythm: Normal rate and regular rhythm.  Pulmonary:     Effort: Pulmonary effort is normal.  Abdominal:     Palpations:  Abdomen is soft.  Neurological:     Mental Status: She is alert.  Psychiatric:        Mood and Affect: Mood normal.        Behavior: Behavior normal.          Scheduled Medications:   amLODipine   5 mg Oral Daily   atorvastatin   10 mg Oral Daily   enoxaparin  (LOVENOX ) injection  40 mg Subcutaneous Q24H   feeding supplement  237 mL Oral BID BM   fluticasone  furoate-vilanterol  1 puff Inhalation Daily   lisinopril   20 mg Oral Daily   multivitamin with minerals  1 tablet Oral Daily   pantoprazole   40 mg Oral QHS   potassium chloride   10 mEq Oral Daily   propranolol   10 mg Oral BID    Continuous Infusions:   PRN Medications:  acetaminophen  **OR** acetaminophen , ondansetron  **OR** ondansetron  (ZOFRAN ) IV, polyethylene glycol  Antimicrobials from admission:  Anti-infectives (From admission, onward)    Start     Dose/Rate Route Frequency Ordered Stop   09/03/24 2200  cephALEXin  (KEFLEX ) capsule 500 mg        500 mg Oral Every 8 hours 09/03/24 0737 09/05/24 1401   09/01/24 2200  cefTRIAXone  (ROCEPHIN ) 1 g in sodium chloride  0.9 % 100 mL IVPB  Status:  Discontinued        1 g 200 mL/hr over 30 Minutes Intravenous Every 24 hours 08/31/24 2119 09/03/24 0737   08/31/24 1945  cefTRIAXone  (ROCEPHIN ) 1 g in sodium chloride  0.9 % 100 mL IVPB        1 g 200 mL/hr over 30 Minutes Intravenous  Once 08/31/24 1944 08/31/24 2027           Data Reviewed:  I have personally reviewed the following...  CBC: Recent Labs  Lab 08/31/24 1224 09/01/24 0505 09/02/24 0407 09/04/24 0439  WBC 8.8 8.2 6.4 11.9*  HGB 12.8 11.4* 12.8 13.2  HCT 39.4 34.7* 38.9 39.5  MCV 91.8 93.0 93.1 90.8  PLT 247 193 100* 265   Basic Metabolic Panel: Recent Labs  Lab 08/31/24 1224 08/31/24 1833 09/01/24 0505 09/01/24 1651 09/02/24 0407 09/03/24 0524 09/04/24 0439  NA 142  --  140  --  138 139  --   K 2.9*  --  2.9* 3.7 3.3* 3.0* 4.7  CL 99  --  104  --  100 100  --   CO2 31  --  26  --  24  27  --   GLUCOSE 95  --  85  --  88 98  --   BUN 10  --  8  --  10 7*  --   CREATININE 0.70  --  0.57  --  0.73 0.65  --   CALCIUM  9.7  --  8.3*  --  9.2 9.0  --   MG  --  1.9  --   --  2.0  --   --   PHOS  --  3.7  --   --   --  3.9  --    GFR: Estimated Creatinine Clearance: 42.2 mL/min (by C-G formula based on SCr of 0.65 mg/dL). Liver Function Tests: Recent Labs  Lab 08/31/24 1224 09/03/24 0524  AST 11*  --   ALT 11  --   ALKPHOS 89  --   BILITOT 0.7  --   PROT 6.8  --   ALBUMIN 4.2 3.5   No results for input(s): LIPASE, AMYLASE in the last 168 hours. Recent Labs  Lab 09/01/24 1150  AMMONIA 30   Coagulation Profile: No results for input(s): INR, PROTIME in the last 168 hours. Cardiac Enzymes: No results for input(s): CKTOTAL, CKMB, CKMBINDEX, TROPONINI in the last 168 hours. BNP (last 3 results) No results for input(s): PROBNP in the last 8760 hours. HbA1C: No results for input(s): HGBA1C in the last 72 hours. CBG: No results for input(s): GLUCAP in the last 168 hours. Lipid Profile: No results for input(s): CHOL, HDL, LDLCALC, TRIG, CHOLHDL, LDLDIRECT in the last 72 hours. Thyroid  Function Tests: No results for input(s): TSH, T4TOTAL, FREET4, T3FREE, THYROIDAB in the last 72 hours. Anemia Panel: No results for input(s): VITAMINB12, FOLATE, FERRITIN, TIBC, IRON , RETICCTPCT in the last 72 hours.  Most Recent Urinalysis On File:     Component Value Date/Time   COLORURINE AMBER (A) 08/31/2024 1832   APPEARANCEUR CLOUDY (A) 08/31/2024 1832   LABSPEC 1.016 08/31/2024 1832   PHURINE 5.0 08/31/2024 1832   GLUCOSEU NEGATIVE 08/31/2024 1832   GLUCOSEU NEGATIVE 05/24/2022 1547   HGBUR NEGATIVE 08/31/2024 1832   BILIRUBINUR NEGATIVE 08/31/2024 1832   BILIRUBINUR negative 01/09/2024 1238   KETONESUR NEGATIVE 08/31/2024 1832   PROTEINUR NEGATIVE 08/31/2024 1832   UROBILINOGEN 0.2 01/09/2024 1238   UROBILINOGEN  2.0 (A) 05/24/2022 1547   NITRITE POSITIVE (A) 08/31/2024 1832   LEUKOCYTESUR MODERATE (A) 08/31/2024 1832   Sepsis Labs: @LABRCNTIP (procalcitonin:4,lacticidven:4) Microbiology: Recent Results (from the past 240 hours)  Urine Culture (for pregnant, neutropenic or urologic patients or patients with an indwelling urinary catheter)     Status: Abnormal   Collection Time: 08/31/24  6:32 PM   Specimen: Urine, Random  Result Value Ref Range Status   Specimen Description   Final    URINE, RANDOM Performed at [facility], [address], [place], [place] [address]    Special Requests   Final    NONE Performed at [facility], [address] [place], [place] [address]    Culture >=100,000 COLONIES/mL ESCHERICHIA COLI (A)  Final   Report Status 09/03/2024 FINAL  Final   Organism ID, Bacteria ESCHERICHIA COLI (A)  Final      Susceptibility   Escherichia coli - MIC*    AMPICILLIN 4 SENSITIVE Sensitive     CEFAZOLIN (URINE) Value in next row Sensitive      2 SENSITIVEThis is a modified FDA-approved test that has been validated and its performance characteristics determined by the reporting laboratory.  This laboratory is certified under the Clinical Laboratory Improvement Amendments CLIA as qualified to perform high complexity clinical laboratory testing.    CEFEPIME Value in next row Sensitive      2 SENSITIVEThis is a modified FDA-approved test that has been validated and its performance characteristics determined by the reporting laboratory.  This laboratory is certified under the Clinical Laboratory Improvement Amendments CLIA as qualified to perform high complexity clinical laboratory testing.  ERTAPENEM Value in next row Sensitive      2 SENSITIVEThis is a modified FDA-approved test that has been validated and its performance characteristics determined by the reporting laboratory.  This laboratory is certified under the Clinical Laboratory Improvement Amendments CLIA  as qualified to perform high complexity clinical laboratory testing.    CEFTRIAXONE  Value in next row Sensitive      2 SENSITIVEThis is a modified FDA-approved test that has been validated and its performance characteristics determined by the reporting laboratory.  This laboratory is certified under the Clinical Laboratory Improvement Amendments CLIA as qualified to perform high complexity clinical laboratory testing.    CIPROFLOXACIN  Value in next row Sensitive      2 SENSITIVEThis is a modified FDA-approved test that has been validated and its performance characteristics determined by the reporting laboratory.  This laboratory is certified under the Clinical Laboratory Improvement Amendments CLIA as qualified to perform high complexity clinical laboratory testing.    GENTAMICIN Value in next row Sensitive      2 SENSITIVEThis is a modified FDA-approved test that has been validated and its performance characteristics determined by the reporting laboratory.  This laboratory is certified under the Clinical Laboratory Improvement Amendments CLIA as qualified to perform high complexity clinical laboratory testing.    NITROFURANTOIN Value in next row Sensitive      2 SENSITIVEThis is a modified FDA-approved test that has been validated and its performance characteristics determined by the reporting laboratory.  This laboratory is certified under the Clinical Laboratory Improvement Amendments CLIA as qualified to perform high complexity clinical laboratory testing.    TRIMETH /SULFA  Value in next row Sensitive      2 SENSITIVEThis is a modified FDA-approved test that has been validated and its performance characteristics determined by the reporting laboratory.  This laboratory is certified under the Clinical Laboratory Improvement Amendments CLIA as qualified to perform high complexity clinical laboratory testing.    AMPICILLIN/SULBACTAM Value in next row Sensitive      2 SENSITIVEThis is a modified  FDA-approved test that has been validated and its performance characteristics determined by the reporting laboratory.  This laboratory is certified under the Clinical Laboratory Improvement Amendments CLIA as qualified to perform high complexity clinical laboratory testing.    PIP/TAZO Value in next row Sensitive      <=4 SENSITIVEThis is a modified FDA-approved test that has been validated and its performance characteristics determined by the reporting laboratory.  This laboratory is certified under the Clinical Laboratory Improvement Amendments CLIA as qualified to perform high complexity clinical laboratory testing.    MEROPENEM Value in next row Sensitive      <=4 SENSITIVEThis is a modified FDA-approved test that has been validated and its performance characteristics determined by the reporting laboratory.  This laboratory is certified under the Clinical Laboratory Improvement Amendments CLIA as qualified to perform high complexity clinical laboratory testing.    * >=100,000 COLONIES/mL ESCHERICHIA COLI  CULTURE, BLOOD (ROUTINE X 2) w Reflex to ID Panel     Status: None (Preliminary result)   Collection Time: 09/01/24  8:53 AM   Specimen: BLOOD  Result Value Ref Range Status   Specimen Description   Final    BLOOD Blood Culture results may not be optimal due to an inadequate volume of blood received in culture bottles   Special Requests   Final    BOTTLES DRAWN AEROBIC AND ANAEROBIC RIGHT ANTECUBITAL   Culture   Final    NO GROWTH 4 DAYS Performed at  [facility], [address] [place], [place] [address]    Report Status PENDING  Incomplete  MRSA Next Gen by PCR, Nasal     Status: None   Collection Time: 09/03/24  6:15 AM   Specimen: Nasal Mucosa; Nasal Swab  Result Value Ref Range Status   MRSA by PCR Next Gen NOT DETECTED NOT DETECTED Final    Comment: (NOTE) The GeneXpert MRSA Assay (FDA approved for NASAL specimens only), is one component of a comprehensive MRSA  colonization surveillance program. It is not intended to diagnose MRSA infection nor to guide or monitor treatment for MRSA infections. Test performance is not FDA approved in patients less than 35 years old. Performed at [facility], [address], [place], [place] [address]       Radiology Studies last 3 days: No results found.        Dierdre Mccalip, DO [facility] 09/05/2024, 5:34 PM    Dictation software may have been used to generate the above note. Typos may occur and escape review in typed/dictated notes. Please contact Dr Marsa directly for clarity if needed.  Staff may message me via secure chat in Epic  but this may not receive an immediate response,  please page me for urgent matters!  If 7PM-7AM, please contact night coverage [email or web address]       "

## 2024-09-06 LAB — CULTURE, BLOOD (ROUTINE X 2): Culture: NO GROWTH

## 2024-09-06 NOTE — Plan of Care (Signed)

## 2024-09-06 NOTE — Progress Notes (Signed)
 " PROGRESS NOTE    Courtney Thomas   FMW:[id number] DOB: 04-09-1946  DOA: 08/31/2024 Date of Service: 09/06/24 which is hospital day 6  PCP: Rilla Baller, MD    HPI:[name] is an elderly female brought to the ED from the skilled nursing facility (SNF)  health, where she has resided for approximately one month after losing the ability to walk following a dental abscess that developed the day after Thanksgiving, according to her partner, [name]. She has been weak and wheelchair-bound since then. She has had some falls at the nursing facility. She was brought to the hospital because of general weakness, poor oral intake, increased frequency of micturition and urgency of micturition. Family: Daughter (in [place]) designated healthcare power of attorney.   Hospital course / significant events: 01/31: admitted to hospitalist w/ AMS,treating for UTI w/ ceftriaxone , holding CNS/psychoactive Rx 02/01: pending cultures  02/02: Urine culture growing E. coli.  02/03: Pansensitive Ecoli. Transition to Keflex . Potassium down from 3.3-3.0 despite repletion. Continue potassium repletion and monitor levels.  02/04: await SNF bed offers 02/05: pending decision/auth for bed offer [facility]  02/06: bed offer was rescinded, still working on placement, family appealing      Consultants:  none  Procedures/Surgeries: none      ASSESSMENT & PLAN:   Acute E. coli UTI:  Urine culture showed pansensitive E. coli.  No growth on blood cultures. IV cefazolin to Keflex .  Plan to treat for total of 5 days - completed treatment.     Acute metabolic encephalopathy: Improved.  Probably acute UTI contributed to confusion.  She has some mild cognitive impairment/dementia at baseline and likely this is contributing to hospital delirium monitor   Hypokalemia Replace as needed Monitor BMP  General weakness: PT and OT recommended discharge to SNF.   Follow-up with TOC to  assist with disposition.    Acute thrombocytopenia:  Platelet dropped from 193-100.   Follow CBC      Comorbidities include HTN COPD    Borderline underweight based on BMI: Body mass index is 19.53 kg/m.SABRA Significantly low or high BMI is associated with higher medical risk.  Underweight - under 18  overweight - 25 to 29 obese - 30 or more Class 1 obesity: BMI of 30.0 to 34 Class 2 obesity: BMI of 35.0 to 39 Class 3 obesity: BMI of 40.0 to 49 Super Morbid Obesity: BMI 50-59 Super-super Morbid Obesity: BMI 60+ Healthy nutrition and physical activity advised as adjunct to other disease management and risk reduction treatments    DVT prophylaxis: lovenox  IV fluids: no continuous IV fluids  Nutrition: regular Central lines / other devices: none  Code Status: FULL CODE ACP documentation reviewed: has advanced on file in [facility]  [facility] needs: placement - pt refuses to go back to [facility] barriers to dispo at this time: none. Expected readiness for discharge per TOC at this time:               Subjective / Brief ROS:  Patient reports no concerns remains alert today  Denies CP/SOB.  Pain controlled.  Denies new weakness.    Family Communication: family at bedside on rounds    Objective Findings:  Vitals:   09/05/24 1939 09/06/24 0337 09/06/24 1317 09/06/24 1544  BP: (!) 147/56 (!) 147/52 (!) 141/36 (!) 114/34  Pulse: 87 77 (!) 57 (!) 52  Resp:  17 16 16   Temp: 98.8 F (37.1 C) 98.2 F (36.8 C) 97.8 F (36.6 C)  97.9 F (36.6 C)  TempSrc: Oral Oral  Oral  SpO2: 96% 96% 99% 98%  Weight:      Height:        Intake/Output Summary (Last 24 hours) at 09/06/2024 1554 Last data filed at 09/06/2024 1300 Gross per 24 hour  Intake 240 ml  Output --  Net 240 ml    Filed Weights   08/31/24 1223  Weight: 45.4 kg    Examination:  Physical Exam Constitutional:      General: She is not in acute distress. Cardiovascular:     Rate and  Rhythm: Normal rate and regular rhythm.  Pulmonary:     Effort: Pulmonary effort is normal.  Abdominal:     Palpations: Abdomen is soft.  Neurological:     Mental Status: She is alert.     Comments: Knows she in a hospital can't tell me why, does not remember me from yesterday, when asked the year she says of course but she can follow commands and speech is normal  Psychiatric:        Mood and Affect: Mood normal.        Behavior: Behavior normal.          Scheduled Medications:   amLODipine   5 mg Oral Daily   atorvastatin   10 mg Oral Daily   enoxaparin  (LOVENOX ) injection  40 mg Subcutaneous Q24H   feeding supplement  237 mL Oral BID BM   fluticasone  furoate-vilanterol  1 puff Inhalation Daily   lisinopril   20 mg Oral Daily   multivitamin with minerals  1 tablet Oral Daily   pantoprazole   40 mg Oral QHS   potassium chloride   10 mEq Oral Daily   propranolol   10 mg Oral BID    Continuous Infusions:   PRN Medications:  acetaminophen  **OR** acetaminophen , ondansetron  **OR** ondansetron  (ZOFRAN ) IV, polyethylene glycol  Antimicrobials from admission:  Anti-infectives (From admission, onward)    Start     Dose/Rate Route Frequency Ordered Stop   09/03/24 2200  cephALEXin  (KEFLEX ) capsule 500 mg        500 mg Oral Every 8 hours 09/03/24 0737 09/05/24 1401   09/01/24 2200  cefTRIAXone  (ROCEPHIN ) 1 g in sodium chloride  0.9 % 100 mL IVPB  Status:  Discontinued        1 g 200 mL/hr over 30 Minutes Intravenous Every 24 hours 08/31/24 2119 09/03/24 0737   08/31/24 1945  cefTRIAXone  (ROCEPHIN ) 1 g in sodium chloride  0.9 % 100 mL IVPB        1 g 200 mL/hr over 30 Minutes Intravenous  Once 08/31/24 1944 08/31/24 2027           Data Reviewed:  I have personally reviewed the following...  CBC: Recent Labs  Lab 08/31/24 1224 09/01/24 0505 09/02/24 0407 09/04/24 0439  WBC 8.8 8.2 6.4 11.9*  HGB 12.8 11.4* 12.8 13.2  HCT 39.4 34.7* 38.9 39.5  MCV 91.8 93.0 93.1  90.8  PLT 247 193 100* 265   Basic Metabolic Panel: Recent Labs  Lab 08/31/24 1224 08/31/24 1833 09/01/24 0505 09/01/24 1651 09/02/24 0407 09/03/24 0524 09/04/24 0439  NA 142  --  140  --  138 139  --   K 2.9*  --  2.9* 3.7 3.3* 3.0* 4.7  CL 99  --  104  --  100 100  --   CO2 31  --  26  --  24 27  --   GLUCOSE 95  --  85  --  88 98  --   BUN 10  --  8  --  10 7*  --   CREATININE 0.70  --  0.57  --  0.73 0.65  --   CALCIUM  9.7  --  8.3*  --  9.2 9.0  --   MG  --  1.9  --   --  2.0  --   --   PHOS  --  3.7  --   --   --  3.9  --    GFR: Estimated Creatinine Clearance: 42.2 mL/min (by C-G formula based on SCr of 0.65 mg/dL). Liver Function Tests: Recent Labs  Lab 08/31/24 1224 09/03/24 0524  AST 11*  --   ALT 11  --   ALKPHOS 89  --   BILITOT 0.7  --   PROT 6.8  --   ALBUMIN 4.2 3.5   No results for input(s): LIPASE, AMYLASE in the last 168 hours. Recent Labs  Lab 09/01/24 1150  AMMONIA 30   Coagulation Profile: No results for input(s): INR, PROTIME in the last 168 hours. Cardiac Enzymes: No results for input(s): CKTOTAL, CKMB, CKMBINDEX, TROPONINI in the last 168 hours. BNP (last 3 results) No results for input(s): PROBNP in the last 8760 hours. HbA1C: No results for input(s): HGBA1C in the last 72 hours. CBG: No results for input(s): GLUCAP in the last 168 hours. Lipid Profile: No results for input(s): CHOL, HDL, LDLCALC, TRIG, CHOLHDL, LDLDIRECT in the last 72 hours. Thyroid  Function Tests: No results for input(s): TSH, T4TOTAL, FREET4, T3FREE, THYROIDAB in the last 72 hours. Anemia Panel: No results for input(s): VITAMINB12, FOLATE, FERRITIN, TIBC, IRON , RETICCTPCT in the last 72 hours.  Most Recent Urinalysis On File:     Component Value Date/Time   COLORURINE AMBER (A) 08/31/2024 1832   APPEARANCEUR CLOUDY (A) 08/31/2024 1832   LABSPEC 1.016 08/31/2024 1832   PHURINE 5.0 08/31/2024 1832    GLUCOSEU NEGATIVE 08/31/2024 1832   GLUCOSEU NEGATIVE 05/24/2022 1547   HGBUR NEGATIVE 08/31/2024 1832   BILIRUBINUR NEGATIVE 08/31/2024 1832   BILIRUBINUR negative 01/09/2024 1238   KETONESUR NEGATIVE 08/31/2024 1832   PROTEINUR NEGATIVE 08/31/2024 1832   UROBILINOGEN 0.2 01/09/2024 1238   UROBILINOGEN 2.0 (A) 05/24/2022 1547   NITRITE POSITIVE (A) 08/31/2024 1832   LEUKOCYTESUR MODERATE (A) 08/31/2024 1832   Sepsis Labs: @LABRCNTIP (procalcitonin:4,lacticidven:4) Microbiology: Recent Results (from the past 240 hours)  Urine Culture (for pregnant, neutropenic or urologic patients or patients with an indwelling urinary catheter)     Status: Abnormal   Collection Time: 08/31/24  6:32 PM   Specimen: Urine, Random  Result Value Ref Range Status   Specimen Description   Final    URINE, RANDOM Performed at [facility], [address] [place], [place] [address]    Special Requests   Final    NONE Performed at [facility] [address] [place], [place] [address]    Culture >=100,000 COLONIES/mL ESCHERICHIA COLI (A)  Final   Report Status 09/03/2024 FINAL  Final   Organism ID, Bacteria ESCHERICHIA COLI (A)  Final      Susceptibility   Escherichia coli - MIC*    AMPICILLIN 4 SENSITIVE Sensitive     CEFAZOLIN (URINE) Value in next row Sensitive      2 SENSITIVEThis is a modified FDA-approved test that has been validated and its performance characteristics determined by the reporting laboratory.  This laboratory is certified under the Clinical Laboratory Improvement Amendments CLIA as qualified to perform  high complexity clinical laboratory testing.    CEFEPIME Value in next row Sensitive      2 SENSITIVEThis is a modified FDA-approved test that has been validated and its performance characteristics determined by the reporting laboratory.  This laboratory is certified under the Clinical Laboratory Improvement Amendments CLIA as qualified to perform high complexity  clinical laboratory testing.    ERTAPENEM Value in next row Sensitive      2 SENSITIVEThis is a modified FDA-approved test that has been validated and its performance characteristics determined by the reporting laboratory.  This laboratory is certified under the Clinical Laboratory Improvement Amendments CLIA as qualified to perform high complexity clinical laboratory testing.    CEFTRIAXONE  Value in next row Sensitive      2 SENSITIVEThis is a modified FDA-approved test that has been validated and its performance characteristics determined by the reporting laboratory.  This laboratory is certified under the Clinical Laboratory Improvement Amendments CLIA as qualified to perform high complexity clinical laboratory testing.    CIPROFLOXACIN  Value in next row Sensitive      2 SENSITIVEThis is a modified FDA-approved test that has been validated and its performance characteristics determined by the reporting laboratory.  This laboratory is certified under the Clinical Laboratory Improvement Amendments CLIA as qualified to perform high complexity clinical laboratory testing.    GENTAMICIN Value in next row Sensitive      2 SENSITIVEThis is a modified FDA-approved test that has been validated and its performance characteristics determined by the reporting laboratory.  This laboratory is certified under the Clinical Laboratory Improvement Amendments CLIA as qualified to perform high complexity clinical laboratory testing.    NITROFURANTOIN Value in next row Sensitive      2 SENSITIVEThis is a modified FDA-approved test that has been validated and its performance characteristics determined by the reporting laboratory.  This laboratory is certified under the Clinical Laboratory Improvement Amendments CLIA as qualified to perform high complexity clinical laboratory testing.    TRIMETH /SULFA  Value in next row Sensitive      2 SENSITIVEThis is a modified FDA-approved test that has been validated and its performance  characteristics determined by the reporting laboratory.  This laboratory is certified under the Clinical Laboratory Improvement Amendments CLIA as qualified to perform high complexity clinical laboratory testing.    AMPICILLIN/SULBACTAM Value in next row Sensitive      2 SENSITIVEThis is a modified FDA-approved test that has been validated and its performance characteristics determined by the reporting laboratory.  This laboratory is certified under the Clinical Laboratory Improvement Amendments CLIA as qualified to perform high complexity clinical laboratory testing.    PIP/TAZO Value in next row Sensitive      <=4 SENSITIVEThis is a modified FDA-approved test that has been validated and its performance characteristics determined by the reporting laboratory.  This laboratory is certified under the Clinical Laboratory Improvement Amendments CLIA as qualified to perform high complexity clinical laboratory testing.    MEROPENEM Value in next row Sensitive      <=4 SENSITIVEThis is a modified FDA-approved test that has been validated and its performance characteristics determined by the reporting laboratory.  This laboratory is certified under the Clinical Laboratory Improvement Amendments CLIA as qualified to perform high complexity clinical laboratory testing.    * >=100,000 COLONIES/mL ESCHERICHIA COLI  CULTURE, BLOOD (ROUTINE X 2) w Reflex to ID Panel     Status: None   Collection Time: 09/01/24  8:53 AM   Specimen: BLOOD  Result Value Ref Range Status  Specimen Description   Final    BLOOD Blood Culture results may not be optimal due to an inadequate volume of blood received in culture bottles   Special Requests   Final    BOTTLES DRAWN AEROBIC AND ANAEROBIC RIGHT ANTECUBITAL   Culture   Final    NO GROWTH 5 DAYS Performed at [facility], [address] [place], [place] [address]    Report Status 09/06/2024 FINAL  Final  MRSA Next Gen by PCR, Nasal     Status: None   Collection  Time: 09/03/24  6:15 AM   Specimen: Nasal Mucosa; Nasal Swab  Result Value Ref Range Status   MRSA by PCR Next Gen NOT DETECTED NOT DETECTED Final    Comment: (NOTE) The GeneXpert MRSA Assay (FDA approved for NASAL specimens only), is one component of a comprehensive MRSA colonization surveillance program. It is not intended to diagnose MRSA infection nor to guide or monitor treatment for MRSA infections. Test performance is not FDA approved in patients less than 76 years old. Performed at [facility], [address] [place], [place] [address]       Radiology Studies last 3 days: No results found.        Alyssia Heese, DO [facility] 09/06/2024, 3:54 PM    Dictation software may have been used to generate the above note. Typos may occur and escape review in typed/dictated notes. Please contact Dr Marsa directly for clarity if needed.  Staff may message me via secure chat in Epic  but this may not receive an immediate response,  please page me for urgent matters!  If 7PM-7AM, please contact night coverage [email or web address]       "

## 2024-09-06 NOTE — TOC Progression Note (Signed)
 Transition of Care [facility]) - Progression Note    Patient Details  Name: Courtney Thomas MRN: [id number] Date of Birth: 06-01-1946  [facility] CM/SW Contact  Grayce JAYSON Perfect, RN Phone Number: 09/06/2024, 11:38 AM  Clinical Narrative:   RNCM unable to reach daughter after several attempts.  RN reached out to friend/CG who has been in room with her in the mornings.  He is in agreement with bed offer from [facility] .  RNCM reached out to facility, representative states she does not have bed, then said may be Monday , then said she has a list of patients to admit and cannot take this patient and declined in hub.  RNCM notified CG of this note and he states well she is not ready to leave the hospital anyway and got a note from hospital that said he could dispute the discharge so he is going to call .    RNCM notified MD and will continue to follow patient until discharge.                       Expected Discharge Plan and Services                                               Social Drivers of Health (SDOH) Interventions SDOH Screenings   Food Insecurity: No Food Insecurity (09/03/2024)  Housing: Low Risk (09/03/2024)  Transportation Needs: No Transportation Needs (09/03/2024)  Utilities: Not At Risk (09/03/2024)  Alcohol Screen: Low Risk (08/30/2022)  Depression (PHQ2-9): Medium Risk (05/01/2024)  Financial Resource Strain: Low Risk  (02/20/2024)   Received from [facility]  Physical Activity: Inactive (08/30/2022)  Social Connections: Socially Isolated (09/05/2024)  Stress: No Stress Concern Present (08/30/2022)  Tobacco Use: Medium Risk (08/31/2024)    Readmission Risk Interventions     No data to display

## 2024-09-06 NOTE — TOC Progression Note (Signed)
 Transition of Care [facility]) - Progression Note    Patient Details  Name: Courtney Thomas MRN: [id number] Date of Birth: June 06, 1946  [facility] CM/SW Contact  Grayce JAYSON Perfect, RN Phone Number: 09/06/2024, 2:59 PM  Clinical Narrative:   RNCM met with patient and her CG in room.  Aware that facility has declined bed offer. Patient and CG aware that patient is ready for discharge and have a bed offer at [facility] or home.  CG reports I got a note from hospital and I plan to dispute discharge as she is not doing good and not ready to leave hospital  MD notified of this and will continue to follow case until resolution.                      Expected Discharge Plan and Services                                               Social Drivers of Health (SDOH) Interventions SDOH Screenings   Food Insecurity: No Food Insecurity (09/03/2024)  Housing: Low Risk (09/03/2024)  Transportation Needs: No Transportation Needs (09/03/2024)  Utilities: Not At Risk (09/03/2024)  Alcohol Screen: Low Risk (08/30/2022)  Depression (PHQ2-9): Medium Risk (05/01/2024)  Financial Resource Strain: Low Risk  (02/20/2024)   Received from [facility]  Physical Activity: Inactive (08/30/2022)  Social Connections: Socially Isolated (09/05/2024)  Stress: No Stress Concern Present (08/30/2022)  Tobacco Use: Medium Risk (08/31/2024)    Readmission Risk Interventions     No data to display

## 2025-05-05 ENCOUNTER — Ambulatory Visit

## 2025-05-05 ENCOUNTER — Encounter: Admitting: Family Medicine
